# Patient Record
Sex: Female | Born: 1985 | Race: Black or African American | Hispanic: No | Marital: Single | Smoking: Former smoker
Health system: Southern US, Community
[De-identification: ages and names within clinical notes are randomized; demographics above are authoritative.]

## PROBLEM LIST (undated history)

## (undated) ENCOUNTER — Inpatient Hospital Stay (HOSPITAL_COMMUNITY): Payer: Self-pay

## (undated) DIAGNOSIS — R51 Headache: Secondary | ICD-10-CM

## (undated) DIAGNOSIS — D649 Anemia, unspecified: Secondary | ICD-10-CM

## (undated) DIAGNOSIS — R87613 High grade squamous intraepithelial lesion on cytologic smear of cervix (HGSIL): Secondary | ICD-10-CM

## (undated) DIAGNOSIS — O039 Complete or unspecified spontaneous abortion without complication: Secondary | ICD-10-CM

## (undated) DIAGNOSIS — R87619 Unspecified abnormal cytological findings in specimens from cervix uteri: Secondary | ICD-10-CM

## (undated) DIAGNOSIS — B9689 Other specified bacterial agents as the cause of diseases classified elsewhere: Secondary | ICD-10-CM

## (undated) DIAGNOSIS — R011 Cardiac murmur, unspecified: Secondary | ICD-10-CM

## (undated) DIAGNOSIS — N83209 Unspecified ovarian cyst, unspecified side: Secondary | ICD-10-CM

## (undated) DIAGNOSIS — IMO0002 Reserved for concepts with insufficient information to code with codable children: Secondary | ICD-10-CM

## (undated) DIAGNOSIS — N760 Acute vaginitis: Secondary | ICD-10-CM

## (undated) DIAGNOSIS — J189 Pneumonia, unspecified organism: Secondary | ICD-10-CM

## (undated) DIAGNOSIS — R87629 Unspecified abnormal cytological findings in specimens from vagina: Secondary | ICD-10-CM

## (undated) HISTORY — DX: High grade squamous intraepithelial lesion on cytologic smear of cervix (HGSIL): R87.613

## (undated) HISTORY — PX: MYRINGOTOMY: SHX2060

## (undated) HISTORY — DX: Complete or unspecified spontaneous abortion without complication: O03.9

## (undated) HISTORY — PX: THERAPEUTIC ABORTION: SHX798

## (undated) HISTORY — PX: COLPOSCOPY: SHX161

---
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
## 2004-10-03 ENCOUNTER — Inpatient Hospital Stay (HOSPITAL_COMMUNITY): Admission: AD | Admit: 2004-10-03 | Discharge: 2004-10-03 | Payer: Self-pay | Admitting: Obstetrics and Gynecology

## 2005-02-27 ENCOUNTER — Emergency Department (HOSPITAL_COMMUNITY): Admission: EM | Admit: 2005-02-27 | Discharge: 2005-02-27 | Payer: Self-pay | Admitting: Emergency Medicine

## 2005-03-25 ENCOUNTER — Emergency Department (HOSPITAL_COMMUNITY): Admission: EM | Admit: 2005-03-25 | Discharge: 2005-03-25 | Payer: Self-pay | Admitting: Emergency Medicine

## 2005-04-08 ENCOUNTER — Emergency Department (HOSPITAL_COMMUNITY): Admission: EM | Admit: 2005-04-08 | Discharge: 2005-04-08 | Payer: Self-pay | Admitting: Emergency Medicine

## 2005-12-23 ENCOUNTER — Emergency Department (HOSPITAL_COMMUNITY): Admission: EM | Admit: 2005-12-23 | Discharge: 2005-12-23 | Payer: Self-pay | Admitting: Emergency Medicine

## 2006-01-27 ENCOUNTER — Emergency Department (HOSPITAL_COMMUNITY): Admission: EM | Admit: 2006-01-27 | Discharge: 2006-01-27 | Payer: Self-pay | Admitting: Emergency Medicine

## 2006-02-06 ENCOUNTER — Emergency Department (HOSPITAL_COMMUNITY): Admission: EM | Admit: 2006-02-06 | Discharge: 2006-02-06 | Payer: Self-pay | Admitting: Emergency Medicine

## 2006-02-11 ENCOUNTER — Inpatient Hospital Stay (HOSPITAL_COMMUNITY): Admission: AD | Admit: 2006-02-11 | Discharge: 2006-02-11 | Payer: Self-pay | Admitting: Obstetrics and Gynecology

## 2006-02-15 ENCOUNTER — Emergency Department (HOSPITAL_COMMUNITY): Admission: EM | Admit: 2006-02-15 | Discharge: 2006-02-15 | Payer: Self-pay | Admitting: Emergency Medicine

## 2006-03-07 ENCOUNTER — Emergency Department (HOSPITAL_COMMUNITY): Admission: EM | Admit: 2006-03-07 | Discharge: 2006-03-07 | Payer: Self-pay | Admitting: Emergency Medicine

## 2006-03-11 ENCOUNTER — Inpatient Hospital Stay (HOSPITAL_COMMUNITY): Admission: AD | Admit: 2006-03-11 | Discharge: 2006-03-11 | Payer: Self-pay | Admitting: Gynecology

## 2006-03-13 ENCOUNTER — Other Ambulatory Visit: Admission: RE | Admit: 2006-03-13 | Discharge: 2006-03-13 | Payer: Self-pay | Admitting: Obstetrics and Gynecology

## 2006-05-12 ENCOUNTER — Inpatient Hospital Stay (HOSPITAL_COMMUNITY): Admission: AD | Admit: 2006-05-12 | Discharge: 2006-05-12 | Payer: Self-pay | Admitting: Obstetrics and Gynecology

## 2006-05-13 ENCOUNTER — Inpatient Hospital Stay (HOSPITAL_COMMUNITY): Admission: AD | Admit: 2006-05-13 | Discharge: 2006-05-13 | Payer: Self-pay | Admitting: Obstetrics and Gynecology

## 2006-05-20 ENCOUNTER — Inpatient Hospital Stay (HOSPITAL_COMMUNITY): Admission: AD | Admit: 2006-05-20 | Discharge: 2006-05-20 | Payer: Self-pay | Admitting: Obstetrics and Gynecology

## 2006-05-22 ENCOUNTER — Ambulatory Visit: Payer: Self-pay | Admitting: Cardiology

## 2006-05-26 ENCOUNTER — Ambulatory Visit: Payer: Self-pay | Admitting: Obstetrics & Gynecology

## 2006-06-09 ENCOUNTER — Ambulatory Visit: Payer: Self-pay | Admitting: Family Medicine

## 2006-07-03 ENCOUNTER — Ambulatory Visit: Payer: Self-pay | Admitting: Family Medicine

## 2006-07-03 ENCOUNTER — Ambulatory Visit: Payer: Self-pay | Admitting: Cardiology

## 2006-07-14 ENCOUNTER — Inpatient Hospital Stay (HOSPITAL_COMMUNITY): Admission: AD | Admit: 2006-07-14 | Discharge: 2006-07-14 | Payer: Self-pay | Admitting: Obstetrics & Gynecology

## 2006-07-14 ENCOUNTER — Ambulatory Visit: Payer: Self-pay | Admitting: *Deleted

## 2006-07-17 ENCOUNTER — Ambulatory Visit: Payer: Self-pay | Admitting: Family Medicine

## 2006-07-18 ENCOUNTER — Ambulatory Visit (HOSPITAL_COMMUNITY): Admission: RE | Admit: 2006-07-18 | Discharge: 2006-07-18 | Payer: Self-pay | Admitting: Family Medicine

## 2006-07-31 ENCOUNTER — Ambulatory Visit: Payer: Self-pay | Admitting: Obstetrics & Gynecology

## 2006-08-18 ENCOUNTER — Ambulatory Visit: Payer: Self-pay | Admitting: Family Medicine

## 2006-08-18 ENCOUNTER — Ambulatory Visit (HOSPITAL_COMMUNITY): Admission: RE | Admit: 2006-08-18 | Discharge: 2006-08-18 | Payer: Self-pay | Admitting: Family Medicine

## 2006-09-04 ENCOUNTER — Ambulatory Visit: Payer: Self-pay | Admitting: Cardiology

## 2006-09-08 ENCOUNTER — Ambulatory Visit: Payer: Self-pay | Admitting: Family Medicine

## 2006-09-15 ENCOUNTER — Encounter: Payer: Self-pay | Admitting: Cardiology

## 2006-09-15 ENCOUNTER — Ambulatory Visit: Payer: Self-pay | Admitting: Obstetrics & Gynecology

## 2006-09-15 ENCOUNTER — Ambulatory Visit: Payer: Self-pay

## 2006-09-22 ENCOUNTER — Ambulatory Visit: Payer: Self-pay | Admitting: Family Medicine

## 2006-09-24 ENCOUNTER — Ambulatory Visit: Payer: Self-pay | Admitting: Obstetrics and Gynecology

## 2006-09-24 ENCOUNTER — Inpatient Hospital Stay (HOSPITAL_COMMUNITY): Admission: AD | Admit: 2006-09-24 | Discharge: 2006-09-25 | Payer: Self-pay | Admitting: Obstetrics and Gynecology

## 2006-09-25 ENCOUNTER — Inpatient Hospital Stay (HOSPITAL_COMMUNITY): Admission: AD | Admit: 2006-09-25 | Discharge: 2006-09-28 | Payer: Self-pay | Admitting: Obstetrics and Gynecology

## 2006-09-25 ENCOUNTER — Ambulatory Visit: Payer: Self-pay | Admitting: Obstetrics and Gynecology

## 2006-10-04 ENCOUNTER — Inpatient Hospital Stay (HOSPITAL_COMMUNITY): Admission: AD | Admit: 2006-10-04 | Discharge: 2006-10-04 | Payer: Self-pay | Admitting: Obstetrics and Gynecology

## 2006-10-04 ENCOUNTER — Ambulatory Visit: Payer: Self-pay | Admitting: Obstetrics and Gynecology

## 2006-10-07 ENCOUNTER — Ambulatory Visit: Payer: Self-pay | Admitting: Obstetrics & Gynecology

## 2007-04-29 ENCOUNTER — Inpatient Hospital Stay (HOSPITAL_COMMUNITY): Admission: AD | Admit: 2007-04-29 | Discharge: 2007-04-29 | Payer: Self-pay | Admitting: Family Medicine

## 2007-06-22 ENCOUNTER — Inpatient Hospital Stay (HOSPITAL_COMMUNITY): Admission: AD | Admit: 2007-06-22 | Discharge: 2007-06-22 | Payer: Self-pay | Admitting: Gynecology

## 2007-09-22 ENCOUNTER — Inpatient Hospital Stay (HOSPITAL_COMMUNITY): Admission: AD | Admit: 2007-09-22 | Discharge: 2007-09-23 | Payer: Self-pay | Admitting: Obstetrics & Gynecology

## 2007-09-23 ENCOUNTER — Inpatient Hospital Stay (HOSPITAL_COMMUNITY): Admission: AD | Admit: 2007-09-23 | Discharge: 2007-09-24 | Payer: Self-pay | Admitting: Obstetrics and Gynecology

## 2007-12-16 ENCOUNTER — Emergency Department (HOSPITAL_COMMUNITY): Admission: EM | Admit: 2007-12-16 | Discharge: 2007-12-16 | Payer: Self-pay | Admitting: Emergency Medicine

## 2008-09-14 ENCOUNTER — Emergency Department (HOSPITAL_COMMUNITY): Admission: EM | Admit: 2008-09-14 | Discharge: 2008-09-14 | Payer: Self-pay | Admitting: Emergency Medicine

## 2009-09-14 ENCOUNTER — Emergency Department (HOSPITAL_COMMUNITY): Admission: EM | Admit: 2009-09-14 | Discharge: 2009-09-14 | Payer: Self-pay | Admitting: Emergency Medicine

## 2009-12-10 ENCOUNTER — Inpatient Hospital Stay (HOSPITAL_COMMUNITY): Admission: AD | Admit: 2009-12-10 | Discharge: 2009-12-10 | Payer: Self-pay | Admitting: Internal Medicine

## 2009-12-10 ENCOUNTER — Ambulatory Visit: Payer: Self-pay | Admitting: Obstetrics and Gynecology

## 2010-08-22 ENCOUNTER — Inpatient Hospital Stay (HOSPITAL_COMMUNITY)
Admission: AD | Admit: 2010-08-22 | Discharge: 2010-08-23 | Payer: Self-pay | Source: Home / Self Care | Attending: Obstetrics & Gynecology | Admitting: Obstetrics & Gynecology

## 2010-08-22 LAB — URINALYSIS, ROUTINE W REFLEX MICROSCOPIC
Bilirubin Urine: NEGATIVE
Leukocytes, UA: NEGATIVE
Nitrite: NEGATIVE
Specific Gravity, Urine: 1.025 (ref 1.005–1.030)
Urobilinogen, UA: 1 mg/dL (ref 0.0–1.0)
pH: 6.5 (ref 5.0–8.0)

## 2010-08-22 LAB — POCT PREGNANCY, URINE: Preg Test, Ur: NEGATIVE

## 2010-08-22 LAB — URINE MICROSCOPIC-ADD ON

## 2010-08-31 ENCOUNTER — Inpatient Hospital Stay (HOSPITAL_COMMUNITY)
Admission: AD | Admit: 2010-08-31 | Discharge: 2010-08-31 | Disposition: A | Discharge status: Home / Self Care | Payer: Self-pay | Source: Ambulatory Visit | Attending: Family Medicine | Admitting: Family Medicine

## 2010-08-31 DIAGNOSIS — L299 Pruritus, unspecified: Secondary | ICD-10-CM | POA: Insufficient documentation

## 2010-08-31 DIAGNOSIS — T373X5A Adverse effect of other antiprotozoal drugs, initial encounter: Secondary | ICD-10-CM | POA: Insufficient documentation

## 2010-09-18 ENCOUNTER — Inpatient Hospital Stay (HOSPITAL_COMMUNITY)
Admission: AD | Admit: 2010-09-18 | Discharge: 2010-09-18 | Disposition: A | Discharge status: Home / Self Care | Payer: Self-pay | Source: Ambulatory Visit | Attending: Obstetrics & Gynecology | Admitting: Obstetrics & Gynecology

## 2010-09-18 DIAGNOSIS — N898 Other specified noninflammatory disorders of vagina: Secondary | ICD-10-CM | POA: Insufficient documentation

## 2010-09-18 LAB — URINE MICROSCOPIC-ADD ON

## 2010-09-18 LAB — URINALYSIS, ROUTINE W REFLEX MICROSCOPIC
Ketones, ur: 15 mg/dL — AB
Urine Glucose, Fasting: NEGATIVE mg/dL
pH: 5.5 (ref 5.0–8.0)

## 2010-09-18 LAB — WET PREP, GENITAL
Clue Cells Wet Prep HPF POC: NONE SEEN
Yeast Wet Prep HPF POC: NONE SEEN

## 2010-09-19 LAB — URINE CULTURE
Colony Count: NO GROWTH
Culture  Setup Time: 201202210442
Culture: NO GROWTH

## 2010-09-28 ENCOUNTER — Inpatient Hospital Stay (HOSPITAL_COMMUNITY)
Admission: AD | Admit: 2010-09-28 | Discharge: 2010-09-28 | Discharge status: Against Medical Advice | Payer: Self-pay | Source: Ambulatory Visit | Attending: Obstetrics & Gynecology | Admitting: Obstetrics & Gynecology

## 2010-09-28 DIAGNOSIS — N760 Acute vaginitis: Secondary | ICD-10-CM | POA: Insufficient documentation

## 2010-09-28 DIAGNOSIS — A499 Bacterial infection, unspecified: Secondary | ICD-10-CM | POA: Insufficient documentation

## 2010-09-28 DIAGNOSIS — B9689 Other specified bacterial agents as the cause of diseases classified elsewhere: Secondary | ICD-10-CM | POA: Insufficient documentation

## 2010-09-28 LAB — POCT PREGNANCY, URINE: Preg Test, Ur: NEGATIVE

## 2010-09-29 ENCOUNTER — Inpatient Hospital Stay (HOSPITAL_COMMUNITY)
Admission: AD | Admit: 2010-09-29 | Discharge: 2010-09-29 | Disposition: A | Discharge status: Home / Self Care | Payer: Self-pay | Source: Ambulatory Visit | Attending: Obstetrics & Gynecology | Admitting: Obstetrics & Gynecology

## 2010-09-29 DIAGNOSIS — N760 Acute vaginitis: Secondary | ICD-10-CM

## 2010-09-29 DIAGNOSIS — N949 Unspecified condition associated with female genital organs and menstrual cycle: Secondary | ICD-10-CM | POA: Insufficient documentation

## 2010-09-29 DIAGNOSIS — A499 Bacterial infection, unspecified: Secondary | ICD-10-CM | POA: Insufficient documentation

## 2010-09-29 DIAGNOSIS — B9689 Other specified bacterial agents as the cause of diseases classified elsewhere: Secondary | ICD-10-CM | POA: Insufficient documentation

## 2010-09-29 LAB — WET PREP, GENITAL: Yeast Wet Prep HPF POC: NONE SEEN

## 2010-10-15 LAB — URINE MICROSCOPIC-ADD ON

## 2010-10-15 LAB — URINALYSIS, ROUTINE W REFLEX MICROSCOPIC
Bilirubin Urine: NEGATIVE
Glucose, UA: NEGATIVE mg/dL
Ketones, ur: NEGATIVE mg/dL
Protein, ur: NEGATIVE mg/dL
Urobilinogen, UA: 0.2 mg/dL (ref 0.0–1.0)

## 2010-10-15 LAB — POCT PREGNANCY, URINE: Preg Test, Ur: NEGATIVE

## 2010-12-06 ENCOUNTER — Inpatient Hospital Stay (HOSPITAL_COMMUNITY)
Admission: AD | Admit: 2010-12-06 | Discharge: 2010-12-07 | Disposition: A | Discharge status: Home / Self Care | Payer: Medicaid Other | Source: Ambulatory Visit | Attending: Obstetrics and Gynecology | Admitting: Obstetrics and Gynecology

## 2010-12-06 DIAGNOSIS — N949 Unspecified condition associated with female genital organs and menstrual cycle: Secondary | ICD-10-CM

## 2010-12-06 DIAGNOSIS — N760 Acute vaginitis: Secondary | ICD-10-CM | POA: Insufficient documentation

## 2010-12-06 DIAGNOSIS — A499 Bacterial infection, unspecified: Secondary | ICD-10-CM

## 2010-12-06 DIAGNOSIS — B9689 Other specified bacterial agents as the cause of diseases classified elsewhere: Secondary | ICD-10-CM | POA: Insufficient documentation

## 2010-12-06 LAB — WET PREP, GENITAL
Trich, Wet Prep: NONE SEEN
Yeast Wet Prep HPF POC: NONE SEEN

## 2010-12-07 LAB — GC/CHLAMYDIA PROBE AMP, GENITAL: GC Probe Amp, Genital: NEGATIVE

## 2010-12-14 NOTE — Assessment & Plan Note (Signed)
[facility]                            CARDIOLOGY OFFICE NOTE   Latoya, Palmer                    MRN:          [id number]  DATE:09/04/2006                            DOB:          [birth year]    REASON FOR VISIT:  Follow up cardiac testing.   HISTORY OF PRESENT ILLNESS:  I saw Latoya Palmer back in October.  She  was referred at that time with premature ventricular complexes noted in  the setting of concurrent abdominal cramps.  I referred her for an  echocardiogram, although she did not present for this study.  She wore  an event recorder which documented premature ventricular complexes but  no sustained arrhythmias.  Her resting electrocardiogram today is  normal, showing sinus rhythm at 80 beats per minute.  She has had no  chest pain or clear exertional symptoms.  She has had no prolonged  dizziness or syncope.  I spoke with her today about the premature  ventricular complexes and would still recommend a structural cardiac  assessment to exclude any element of cardiomyopathy and also to further  evaluate her murmur which at this point I suspect is functional.  At  this point we do not plan any specific medications.   ALLERGIES:  NO KNOWN DRUG ALLERGIES.   PRESENT MEDICATIONS:  Prenatal vitamins.   REVIEW OF SYSTEMS:  As described in history of present illness.   EXAMINATION:  VITAL SIGNS:  Blood pressure today 92/52, heart rate 80,  weight 142 up from 128 in October.  GENERAL:  The patient states that she is due on March 3rd.  The patient  is comfortable and in no acute distress.  HEENT:  Conjunctivae, lids normal.  Oropharynx clear.  Neck is supple  without elevated jugular venous pressure.  LUNGS:  Clear without labored breathing.  CARDIAC:  Exam reveals a regular rate and rhythm with a 2/6 systolic  murmur heard best at the base.  Second heart sound is preserved.  No S3  gallop.  ABDOMEN:  Gravid.  EXTREMITIES:  No significant  pitting edema.  SKIN:  Warm and dry.  MUSCULOSKELETAL:  No kyphosis is noted.   IMPRESSION AND RECOMMENDATION:  1. Premature ventricular complexes, now documented by      electrocardiography and an event recorder.  This is not associated      with any dizziness or syncope.  She typically experiences the sense      of palpitations and brief anxiety.  She has noted feeling this when      she has had some contractions.  Otherwise, no orthopnea or      progressive dyspnea on exertion.  I have again recommended an      echocardiogram for her cardiac structural assessment mainly to      exclude cardiomyopathy.  If this is reassuring would recommend      continued observation and no specific medications directed at this      at the present time.  Following her delivery, I      can see her back in the office.  2. Further plans to follow.  Jonelle Sidle, MD  Electronically Signed    SGM/MedQ  DD: 09/04/2006  DT: 09/04/2006  Job #: [id number]   cc:   Ginette Otto, [place] Dr. Yetta Barre, [facility]  Carollee Herter, [place]

## 2010-12-14 NOTE — Assessment & Plan Note (Signed)
[facility]                              CARDIOLOGY OFFICE NOTE   JERMISHA, HOFFART                    MRN:          [id number]  DATE:05/22/2006                            DOB:          03/16/1986    REFERRING PHYSICIAN:  Carollee Herter, DO   REASON FOR CONSULTATION:  Abnormal electrocardiogram.   HISTORY OF PRESENT ILLNESS:  Ms. Cullen is a 25 year old woman, presently  [redacted] weeks pregnant, with no other major reported medical conditions other  than migraine headaches in the past.  She apparently recently was seen at  [facility] with some abdominal cramping intermittent over a period of a  week.  She was seen at that time and noted to have an irregular heartbeat  and underwent an electrocardiogram.  This tracing from October 23 shows  sinus rhythm with frequent premature ventricular complexes (inferior axis,  likely right-sided in origin).  She denies feeling any major sense of  palpitations at that time, although does state that intermittently, she  feels a brief, rapid heart rate for no more than a few minutes.  She has had  no significant dizziness or syncope associated with this and denies any  exertional symptoms either prior to or during her pregnancy.  She states  that she was pregnant once in the past, although did not go to term and had  an abortion early on.  She presents today to discuss this further.  A repeat  electrocardiogram today is normal, showing sinus rhythm at 81 beats per  minute with no ectopy.  She was also noted to have a soft systolic murmur on  examination during her evaluation at [facility].   ALLERGIES:  No known drug allergies.   CURRENT MEDICATIONS:  Prenatal vitamins.   SOCIAL HISTORY:  Patient is single.  She has a boyfriend.  She has no  children.  She has a prior tobacco use history but states she quit back in  July.  She denies any recreational drug or alcohol use.  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   FAMILY HISTORY:  Reported as negative for premature cardiovascular disease  or sudden cardiac death.   REVIEW OF SYSTEMS:  As described in the history of present illness.  She has  had prior problems with anemia, otherwise systems are negative.   PHYSICAL EXAMINATION:  VITAL SIGNS:  Blood pressure is 98/62, heart rate 81  and regular.  Weight is 128 pounds.  GENERAL:  Patient is comfortable and in no acute distress, denying any  active symptoms.  HEENT:  Conjunctivae and lids normal.  Oropharynx is clear.  NECK:  Supple without elevated jugular venous pressure or loud bruits.  No  thyromegaly is noted.  No thyroid tenderness is noted.  LUNGS:  Clear without labored breathing.  CARDIOVASCULAR:  Regular rate and rhythm with soft basal systolic murmur,  preserved S2.  No S3 gallop or pericardial rub.  ABDOMEN:  Gravid.  Bowel sounds present.  EXTREMITIES:  No significant pitting edema.  Distal pulses are 2+.  SKIN:  No ulcerative changes noted.  MUSCULOSKELETAL:  No kyphosis.  NEUROPSYCHIATRIC:  Patient is alert and oriented x3.  Affect is normal.   IMPRESSION/RECOMMENDATIONS:  1. Recently documented frequent premature ventricular complexes noted in      the setting of concurrent abdominal cramps in a young woman presently      [redacted] weeks pregnant.  Repeat electrocardiogram today is normal.      Symptomatically, Ms. Hove denies any major problems other than      occasional brief palpitations not associated with dizziness or syncope.      She has had no antecedant exertional symptomatology.  She does have a      soft systolic murmur on exam, which is likely functional in the setting      of pregnancy.  We discussed a number of options today, and our plan      will be a baseline 2D echocardiogram to confirm a structurally normal      heart, and also an event recorder to exclude any transient sustained      arrhythmias.  I do not plan any  specific medications at this time and      will plan to see her back to review the results of her testing.  2. Further plans to follow.     Jonelle Sidle, MD    SGM/MedQ  DD: 05/22/2006  DT: 05/23/2006  Job #: [id number]   cc:   Carollee Herter, DO

## 2010-12-27 ENCOUNTER — Emergency Department (HOSPITAL_COMMUNITY)
Admission: EM | Admit: 2010-12-27 | Discharge: 2010-12-27 | Discharge status: Against Medical Advice | Payer: Medicaid Other | Attending: Emergency Medicine | Admitting: Emergency Medicine

## 2010-12-27 ENCOUNTER — Inpatient Hospital Stay (HOSPITAL_COMMUNITY)
Admission: AD | Admit: 2010-12-27 | Payer: Medicaid Other | Source: Ambulatory Visit | Admitting: Obstetrics and Gynecology

## 2010-12-27 DIAGNOSIS — R109 Unspecified abdominal pain: Secondary | ICD-10-CM | POA: Insufficient documentation

## 2010-12-27 LAB — URINALYSIS, ROUTINE W REFLEX MICROSCOPIC
Glucose, UA: NEGATIVE mg/dL
Ketones, ur: NEGATIVE mg/dL
Protein, ur: NEGATIVE mg/dL
pH: 7 (ref 5.0–8.0)

## 2010-12-27 LAB — URINE MICROSCOPIC-ADD ON

## 2011-03-20 ENCOUNTER — Encounter (HOSPITAL_COMMUNITY): Payer: Self-pay | Admitting: *Deleted

## 2011-03-20 ENCOUNTER — Inpatient Hospital Stay (HOSPITAL_COMMUNITY)
Admission: AD | Admit: 2011-03-20 | Discharge: 2011-03-20 | Disposition: A | Discharge status: Home / Self Care | Payer: Self-pay | Source: Ambulatory Visit | Attending: Obstetrics & Gynecology | Admitting: Obstetrics & Gynecology

## 2011-03-20 DIAGNOSIS — A499 Bacterial infection, unspecified: Secondary | ICD-10-CM | POA: Insufficient documentation

## 2011-03-20 DIAGNOSIS — N760 Acute vaginitis: Secondary | ICD-10-CM | POA: Insufficient documentation

## 2011-03-20 DIAGNOSIS — B9689 Other specified bacterial agents as the cause of diseases classified elsewhere: Secondary | ICD-10-CM | POA: Insufficient documentation

## 2011-03-20 DIAGNOSIS — N949 Unspecified condition associated with female genital organs and menstrual cycle: Secondary | ICD-10-CM | POA: Insufficient documentation

## 2011-03-20 HISTORY — DX: Unspecified abnormal cytological findings in specimens from cervix uteri: R87.619

## 2011-03-20 HISTORY — DX: Cardiac murmur, unspecified: R01.1

## 2011-03-20 HISTORY — DX: Headache: R51

## 2011-03-20 HISTORY — DX: Reserved for concepts with insufficient information to code with codable children: IMO0002

## 2011-03-20 HISTORY — DX: Anemia, unspecified: D64.9

## 2011-03-20 HISTORY — DX: Unspecified ovarian cyst, unspecified side: N83.209

## 2011-03-20 LAB — WET PREP, GENITAL
Trich, Wet Prep: NONE SEEN
Yeast Wet Prep HPF POC: NONE SEEN

## 2011-03-20 LAB — POCT PREGNANCY, URINE: Preg Test, Ur: NEGATIVE

## 2011-03-20 MED ORDER — CLINDAMYCIN HCL 150 MG PO CAPS: 300.0000 mg | ORAL_CAPSULE | Freq: Two times a day (BID) | ORAL | Status: AC

## 2011-03-20 MED ORDER — CLINDAMYCIN HCL 150 MG PO CAPS: 300.0000 mg | ORAL_CAPSULE | Freq: Two times a day (BID) | ORAL | Status: DC

## 2011-03-20 NOTE — Progress Notes (Signed)
Pt states she has a history of recurrent bacterial infections. Has had a vaginal d/c with an odor for 3 days.

## 2011-03-20 NOTE — ED Provider Notes (Signed)
History     Chief Complaint  Patient presents with  . Vaginal Discharge   HPI Latoya Palmer 25 y.o. comes today with white discharge with odor for several day.  Thinks she has BV.  Has hives when she takes Flagyl by mouth, but uses Metrogel with no problem.  Currently does not have insurance.  Stopped Nuvaring earlier this month.     OB History    Grav Para Term Preterm Abortions TAB SAB Ect Mult Living   6 1 1  0 5 3 2  0 0 1      Past Medical History  Diagnosis Date  . Headache   . Heart murmur   . Anemia   . Ovarian cyst   . Abnormal Pap smear   . Preterm labor     Past Surgical History  Procedure Date  . Myringotomy   . Therapeutic abortion     two  . Colposcopy     No family history on file.  History  Substance Use Topics  . Smoking status: Current Everyday Smoker -- 0.2 packs/day for 4 years  . Smokeless tobacco: Never Used  . Alcohol Use: Yes     twice a month    Allergies:  Allergies  Allergen Reactions  . Flagyl (Metronidazole Hcl) Other (See Comments)    Pt states that 24 hours after taking (she is able to take it fine) she develops hives  . Latex Swelling and Other (See Comments)    Causes irritation    Prescriptions prior to admission  Medication Sig Dispense Refill  . Multiple Vitamin (MULTIVITAMIN) tablet Take 1 tablet by mouth daily.          Review of Systems  Gastrointestinal: Negative for abdominal pain.  Genitourinary: Negative for dysuria.       Vaginal discharge.  Some vaginal bleeding today.   Physical Exam   Blood pressure 121/87, pulse 66, temperature 98.8 F (37.1 C), temperature source Oral, resp. rate 16, height 5' 5.5" (1.664 m), weight 125 lb 6.4 oz (56.881 kg), last menstrual period 03/04/2011, SpO2 100.00%.  Physical Exam  Nursing note and vitals reviewed. Constitutional: She is oriented to person, place, and time. She appears well-developed and well-nourished.  HENT:  Head: Normocephalic.  Eyes: EOM are  normal.  Neck: Neck supple.  GI: Soft. There is no tenderness. There is no rebound and no guarding.  Genitourinary:       Speculum exam: Vagina - Small amount of pink frothy discharge, no odor Cervix - No contact bleeding, no active bleeding noted. Bimanual exam: Cervix closed Uterus non tender, normal size Adnexa non tender, no masses bilaterally GC/Chlam, wet prep done Chaperone present for exam.    Musculoskeletal: Normal range of motion.  Neurological: She is alert and oriented to person, place, and time.  Skin: Skin is warm and dry.  Psychiatric: She has a normal mood and affect.    MAU Course  Procedures  MDM Results for orders placed during the hospital encounter of 03/20/11 (from the past 24 hour(s))  WET PREP, GENITAL     Status: Abnormal   Collection Time   03/20/11 12:25 PM      Component Value Range   Yeast, Wet Prep NONE SEEN  NONE SEEN    Trich, Wet Prep NONE SEEN  NONE SEEN    Clue Cells, Wet Prep MODERATE (*) NONE SEEN    WBC, Wet Prep HPF POC FEW (*) NONE SEEN   POCT PREGNANCY, URINE  Status: Normal   Collection Time   03/20/11 12:46 PM      Component Value Range   Preg Test, Ur NEGATIVE       Assessment and Plan  Bacterial vaginosis  Plan Rx Clindamycin 300 mg PO BID x 7 days (#14) no refills Condoms for intercourse Establish with a local provider for medical care.   Yee Gangi 03/20/2011, 12:30 PM   Nolene Bernheim, NP 03/20/11 1824

## 2011-03-20 NOTE — Progress Notes (Signed)
Symptoms started a wk ago (d/c with odor) has had bv before.

## 2011-03-21 LAB — GC/CHLAMYDIA PROBE AMP, GENITAL: GC Probe Amp, Genital: NEGATIVE

## 2011-04-19 LAB — URINALYSIS, ROUTINE W REFLEX MICROSCOPIC
Bilirubin Urine: NEGATIVE
Bilirubin Urine: NEGATIVE
Glucose, UA: NEGATIVE
Glucose, UA: NEGATIVE
Hgb urine dipstick: NEGATIVE
Ketones, ur: NEGATIVE
Nitrite: NEGATIVE
Protein, ur: NEGATIVE
Specific Gravity, Urine: 1.025
pH: 6.5

## 2011-04-19 LAB — GC/CHLAMYDIA PROBE AMP, GENITAL
Chlamydia, DNA Probe: NEGATIVE
GC Probe Amp, Genital: NEGATIVE

## 2011-04-19 LAB — CBC
Hemoglobin: 12.5
MCHC: 34.3
MCV: 86.5
RBC: 4.2
RDW: 13.6

## 2011-04-19 LAB — POCT PREGNANCY, URINE: Preg Test, Ur: NEGATIVE

## 2011-05-07 LAB — WET PREP, GENITAL
Trich, Wet Prep: NONE SEEN
Yeast Wet Prep HPF POC: NONE SEEN

## 2011-05-07 LAB — URINALYSIS, ROUTINE W REFLEX MICROSCOPIC
Bilirubin Urine: NEGATIVE
Glucose, UA: NEGATIVE
Ketones, ur: NEGATIVE
Leukocytes, UA: NEGATIVE
pH: 7

## 2011-05-07 LAB — CBC
Hemoglobin: 12.7
RBC: 4.33
WBC: 5.9

## 2011-05-07 LAB — POCT PREGNANCY, URINE
Operator id: 20265
Preg Test, Ur: NEGATIVE

## 2011-05-07 LAB — GC/CHLAMYDIA PROBE AMP, GENITAL: GC Probe Amp, Genital: NEGATIVE

## 2011-05-09 LAB — CBC
HCT: 38.7
Hemoglobin: 13.2
MCHC: 34.3
MCV: 85.6
RDW: 12.9

## 2011-05-09 LAB — HERPES SIMPLEX VIRUS CULTURE

## 2011-05-09 LAB — URINALYSIS, ROUTINE W REFLEX MICROSCOPIC
Bilirubin Urine: NEGATIVE
Glucose, UA: NEGATIVE
Hgb urine dipstick: NEGATIVE
Ketones, ur: NEGATIVE
pH: 7.5

## 2011-05-09 LAB — WET PREP, GENITAL: Yeast Wet Prep HPF POC: NONE SEEN

## 2011-06-10 ENCOUNTER — Inpatient Hospital Stay (HOSPITAL_COMMUNITY)
Admission: AD | Admit: 2011-06-10 | Discharge: 2011-06-10 | Disposition: A | Discharge status: Home / Self Care | Payer: Self-pay | Source: Ambulatory Visit | Attending: Obstetrics & Gynecology | Admitting: Obstetrics & Gynecology

## 2011-06-10 DIAGNOSIS — N898 Other specified noninflammatory disorders of vagina: Secondary | ICD-10-CM | POA: Insufficient documentation

## 2011-06-10 NOTE — Progress Notes (Signed)
Pt reports "I have bacterial vaginosis", states she has a discharge with an odor x 3 days. LMP 05/13/2011.

## 2011-06-10 NOTE — Progress Notes (Signed)
Called pt wanting to leave AMA. Pt not in lobby to sign paperwork.

## 2011-06-29 ENCOUNTER — Inpatient Hospital Stay (HOSPITAL_COMMUNITY)
Admission: AD | Admit: 2011-06-29 | Discharge: 2011-06-30 | Disposition: A | Discharge status: Home / Self Care | Payer: Medicaid Other | Source: Ambulatory Visit | Attending: Obstetrics and Gynecology | Admitting: Obstetrics and Gynecology

## 2011-06-29 DIAGNOSIS — A499 Bacterial infection, unspecified: Secondary | ICD-10-CM | POA: Insufficient documentation

## 2011-06-29 DIAGNOSIS — N760 Acute vaginitis: Secondary | ICD-10-CM

## 2011-06-29 DIAGNOSIS — B9689 Other specified bacterial agents as the cause of diseases classified elsewhere: Secondary | ICD-10-CM

## 2011-06-29 DIAGNOSIS — N949 Unspecified condition associated with female genital organs and menstrual cycle: Secondary | ICD-10-CM | POA: Insufficient documentation

## 2011-06-29 NOTE — Progress Notes (Signed)
Pt states, " I've had an odorous vaginal discharge for two weeks, and now my vagina is tender and irritated. "

## 2011-06-30 ENCOUNTER — Encounter (HOSPITAL_COMMUNITY): Payer: Self-pay

## 2011-06-30 LAB — WET PREP, GENITAL

## 2011-06-30 MED ORDER — METRONIDAZOLE 0.75 % VA GEL: 1.0000 | Freq: Every day | VAGINAL | Status: AC

## 2011-06-30 NOTE — Progress Notes (Signed)
Patient is here with c/o vaginal tenderness and feeling that she has BV. She states that she gets it often and does not have a primary doctor.

## 2011-06-30 NOTE — ED Provider Notes (Signed)
History   Latoya Palmer is a 25 y.o. year old [id number] female who presents to [facility] reporting possible Bacterial vaginosis. States she gets them many times per year.   CSN: [id number] Arrival date & time: 06/29/2011 11:45 PM   None     Chief Complaint  Patient presents with  . Vaginal Discharge    (Consider location/radiation/quality/duration/timing/severity/associated sxs/prior treatment) HPI  Past Medical History  Diagnosis Date  . Headache   . Heart murmur   . Anemia   . Ovarian cyst   . Abnormal Pap smear   . Preterm labor     Past Surgical History  Procedure Date  . Myringotomy   . Therapeutic abortion     two  . Colposcopy     History reviewed. No pertinent family history.  History  Substance Use Topics  . Smoking status: Current Everyday Smoker -- 0.5 packs/day for 4 years    Types: Cigarettes  . Smokeless tobacco: Never Used  . Alcohol Use: 1.2 oz/week    1 Shots of liquor, 1 Glasses of wine per week     twice a month    OB History    Grav Para Term Preterm Abortions TAB SAB Ect Mult Living   6 1 1  0 5 3 2  0 0 1      Review of Systems: denies abd pain  Allergies  Flagyl and Latex  Home Medications  No current outpatient prescriptions on file.  BP 124/84  Pulse 65  Temp(Src) 98.8 F (37.1 C) (Oral)  Resp 16  Ht 5' 4.5" (1.638 m)  Wt 57.664 kg (127 lb 2 oz)  BMI 21.48 kg/m2  LMP 06/14/2011  Physical Exam:  General: NAD, A&O x 4 Abdomen: NT Pelvic. Large amount of thin, white, malodorous discharge. No lesions. Cervix non-friable. No CMT, adnexal tenderness or masses.  ED Course  Procedures (including critical care time)  Results for orders placed during the hospital encounter of 06/29/11 (from the past 24 hour(s))  WET PREP, GENITAL     Status: Abnormal   Collection Time   06/30/11 12:56 AM      Component Value Range   Yeast, Wet Prep NONE SEEN  NONE SEEN    Trich, Wet Prep NONE SEEN  NONE SEEN    Clue Cells, Wet Prep MODERATE  (*) NONE SEEN    WBC, Wet Prep HPF POC MODERATE (*) NONE SEEN     MDM  Assessment: 1. Recurrent BV  Plan: 1. Rx Metrogel QD x 5 days, then twice a week x 6 months.  Dorathy Kinsman 06/30/2011 1:34 AM

## 2011-07-01 LAB — POCT PREGNANCY, URINE: Preg Test, Ur: NEGATIVE

## 2011-07-05 NOTE — ED Provider Notes (Signed)
Attestation of Attending Supervision of Advanced Practitioner: Evaluation and management procedures were performed by the PA/NP/CNM/OB Fellow under my supervision/collaboration. Chart reviewed and agree with management and plan.  Erbie Arment V 07/05/2011 9:29 AM

## 2011-07-18 ENCOUNTER — Encounter (HOSPITAL_COMMUNITY): Payer: Self-pay | Admitting: *Deleted

## 2011-07-18 ENCOUNTER — Inpatient Hospital Stay (HOSPITAL_COMMUNITY)
Admission: AD | Admit: 2011-07-18 | Discharge: 2011-07-18 | Disposition: A | Discharge status: Home / Self Care | Payer: Medicaid Other | Source: Ambulatory Visit | Attending: Obstetrics and Gynecology | Admitting: Obstetrics and Gynecology

## 2011-07-18 DIAGNOSIS — A499 Bacterial infection, unspecified: Secondary | ICD-10-CM

## 2011-07-18 DIAGNOSIS — N760 Acute vaginitis: Secondary | ICD-10-CM | POA: Insufficient documentation

## 2011-07-18 DIAGNOSIS — B9689 Other specified bacterial agents as the cause of diseases classified elsewhere: Secondary | ICD-10-CM | POA: Insufficient documentation

## 2011-07-18 DIAGNOSIS — N949 Unspecified condition associated with female genital organs and menstrual cycle: Secondary | ICD-10-CM | POA: Insufficient documentation

## 2011-07-18 LAB — WET PREP, GENITAL: Trich, Wet Prep: NONE SEEN

## 2011-07-18 MED ORDER — CLINDAMYCIN HCL 150 MG PO CAPS: 150.0000 mg | ORAL_CAPSULE | Freq: Three times a day (TID) | ORAL | Status: DC

## 2011-07-18 NOTE — Progress Notes (Signed)
Pt presents to [facility] for c/o BV.  States was seen on 1st and given a prescription to take regularly to keep it from coming back.  States it never went away.

## 2011-07-18 NOTE — ED Provider Notes (Signed)
History     CSN: [id number] Arrival date & time: 07/18/2011  3:08 AM   None     Chief Complaint  Patient presents with  . Vaginal Discharge    HPI Latoya Palmer is a 25 y.o. female who presents to [facility] for vaginal discharge which she believes to be bacterial vaginosis. She has been to [facility]U for the same several times. The last visit 06/29/11 she was given flagyl to take x 5 days and then to take twice a week for 6 months. The patient states that the symptoms never went away and now are worse. She states she was diagnosed with allergy to Flagyl on a previous visit but got it on the last visit in a gel form. She states that in the past Clindamycin has worked better. Not sexually active in 6 months. [facility]  The history was provided by the patient and review of her previous visits.   Past Medical History  Diagnosis Date  . Headache   . Heart murmur   . Anemia   . Ovarian cyst   . Abnormal Pap smear   . Preterm labor     Past Surgical History  Procedure Date  . Myringotomy   . Therapeutic abortion     two  . Colposcopy     No family history on file.  History  Substance Use Topics  . Smoking status: Current Everyday Smoker -- 0.5 packs/day for 4 years    Types: Cigarettes  . Smokeless tobacco: Never Used  . Alcohol Use: 1.2 oz/week    1 Shots of liquor, 1 Glasses of wine per week     twice a month    OB History    Grav Para Term Preterm Abortions TAB SAB Ect Mult Living   6 1 1  0 5 3 2  0 0 1      Review of Systems  Genitourinary: Positive for vaginal discharge.  All other systems reviewed and are negative.    Allergies  Flagyl and Latex  Home Medications  No current outpatient prescriptions on file.  BP 125/65  Pulse 70  Resp 18  Ht 5\' 5"  (1.651 m)  Wt 125 lb (56.7 kg)  BMI 20.80 kg/m2  SpO2 99%  LMP 07/14/2011  Physical Exam  Nursing note and vitals reviewed. Constitutional: She is oriented to  person, place, and time. She appears well-developed and well-nourished.  HENT:  Head: Normocephalic.  Eyes: EOM are normal.  Neck: Neck supple.  Cardiovascular: Normal rate.   Pulmonary/Chest: Effort normal.  Abdominal: Soft. There is no tenderness.  Genitourinary:       External genitalia without lesions.Frothy vaginal discharge with odor. No CMT, no adnexal tenderness. Uterus without palpable enlargement.   Musculoskeletal: Normal range of motion.  Neurological: She is alert and oriented to person, place, and time. No cranial nerve deficit.  Skin: Skin is warm and dry.  Psychiatric: She has a normal mood and affect. Her behavior is normal. Judgment and thought content normal.   Results for orders placed during the hospital encounter of 07/18/11 (from the past 24 hour(s))  WET PREP, GENITAL     Status: Abnormal   Collection Time   07/18/11  3:37 AM      Component Value Range   Yeast, Wet Prep NONE SEEN  NONE SEEN    Trich, Wet Prep NONE SEEN  NONE SEEN    Clue Cells, Wet Prep MANY (*) NONE SEEN  WBC, Wet Prep HPF POC MODERATE (*) NONE SEEN    Assessment: Bacterial vaginosis  Plan:  Clindamycin Rx   Discussed with patient in detail need for continuity of care and suggested she choose a provider and have regular   visits to assess her for her recurrent BV and abnormal pap smears.  ED Course  Procedures           [place], [place] 07/18/11 [phone]

## 2011-07-18 NOTE — ED Provider Notes (Signed)
Attestation of Attending Supervision of Advanced Practitioner: Evaluation and management procedures were performed by the PA/NP/CNM/OB Fellow under my supervision/collaboration. Chart reviewed and agree with management and plan.  Belicia Difatta V 07/18/2011 7:47 AM

## 2011-07-19 LAB — GC/CHLAMYDIA PROBE AMP, GENITAL: GC Probe Amp, Genital: NEGATIVE

## 2011-07-21 ENCOUNTER — Other Ambulatory Visit: Payer: Self-pay | Admitting: Advanced Practice Midwife

## 2011-07-21 DIAGNOSIS — B9689 Other specified bacterial agents as the cause of diseases classified elsewhere: Secondary | ICD-10-CM

## 2011-07-21 MED ORDER — CLINDAMYCIN HCL 150 MG PO CAPS: 150.0000 mg | ORAL_CAPSULE | Freq: Three times a day (TID) | ORAL | Status: DC

## 2011-07-26 ENCOUNTER — Telehealth (HOSPITAL_COMMUNITY): Payer: Self-pay | Admitting: Gynecology

## 2011-07-26 MED ORDER — CLINDAMYCIN HCL 150 MG PO CAPS: 150.0000 mg | ORAL_CAPSULE | Freq: Three times a day (TID) | ORAL | Status: AC

## 2011-07-26 NOTE — Progress Notes (Signed)
Addended by: Dorathy Kinsman on: 07/26/2011 08:33 PM   Modules accepted: Orders

## 2011-08-28 ENCOUNTER — Ambulatory Visit (INDEPENDENT_AMBULATORY_CARE_PROVIDER_SITE_OTHER): Payer: Medicaid Other | Admitting: Obstetrics and Gynecology

## 2011-08-28 ENCOUNTER — Other Ambulatory Visit (HOSPITAL_COMMUNITY)
Admission: RE | Admit: 2011-08-28 | Discharge: 2011-08-28 | Disposition: A | Discharge status: Home / Self Care | Payer: Medicaid Other | Source: Ambulatory Visit | Attending: Obstetrics and Gynecology | Admitting: Obstetrics and Gynecology

## 2011-08-28 ENCOUNTER — Encounter: Payer: Self-pay | Admitting: Advanced Practice Midwife

## 2011-08-28 DIAGNOSIS — R87611 Atypical squamous cells cannot exclude high grade squamous intraepithelial lesion on cytologic smear of cervix (ASC-H): Secondary | ICD-10-CM | POA: Insufficient documentation

## 2011-08-28 DIAGNOSIS — Z01419 Encounter for gynecological examination (general) (routine) without abnormal findings: Secondary | ICD-10-CM | POA: Insufficient documentation

## 2011-08-28 DIAGNOSIS — A499 Bacterial infection, unspecified: Secondary | ICD-10-CM

## 2011-08-28 DIAGNOSIS — B9689 Other specified bacterial agents as the cause of diseases classified elsewhere: Secondary | ICD-10-CM

## 2011-08-28 DIAGNOSIS — N760 Acute vaginitis: Secondary | ICD-10-CM

## 2011-08-28 DIAGNOSIS — R87619 Unspecified abnormal cytological findings in specimens from cervix uteri: Secondary | ICD-10-CM

## 2011-08-28 NOTE — Patient Instructions (Signed)
General Instructions for Vaginal Infections Vaginitis is a term to describe many common vaginal infections. These infections may be due to an imbalance of normal germs (bacteria) that exist in the vagina. Many others are caused by sexually transmitted diseases (STD's). If any medication was prescribed to treat your specific infection, it is very important that you take the medication as directed. Your caregiver may want to examine and treat your sex partner. CAUSES  The vagina normally contains organisms (bacteria and yeast) in a balance. Certain factors can disturb this balance and cause an infection, such as:  Sexual intercourse.   Nursing.   Pregnancy.   Menopause.   Hormone changes in the body.   Antibiotics.   Infection elsewhere in your body.   Birth control pills or patches.   Douches.   Spermicides.   Medical illnesses (diabetes).  SYMPTOMS  Different types of vaginal infections cause symptoms such as:  Itching.   Pain or burning.   Bad odor.   Pain or bleeding with sexual intercourse.   Redness of the vulva.   Abnormal discharge (yellow, green, heavy white and thick).   Fever.   A sore on the vulva or vagina.   Urinary symptoms (painful or bloody urine).   Pelvic and/or abdominal pain.   Rectal bleeding, discharge or pain.  DIAGNOSIS   Your caregiver will base the diagnosis upon the symptoms that you report.   A complete history of your sex life may be taken   You may have a pelvic exam.   A sample of your vaginal fluid and/or discharge will be examined under the microscope.   Cultures will help complete the exact diagnosis.  TREATMENT  Treatment depends on the cause of your vaginitis. Your treatment may include a medicine that kills germs (antibiotic). The antibiotic may be a shot, a pill, and/or vaginal suppository or cream. It is not uncommon for more than one type of infection to be present. If more than one infection is present, two or more  medications may be required. Reoccurrence of vaginal infections may be treated with vaginal suppositories or vaginal cream 2 times a week, or as directed. If your caregiver finds that an STD exists, treatment of your sexual partner(s) is important. This is especially important for those infected with chlamydia, gonorrhea, trichomoniasis, bacterial vaginosis, syphilis and HIV infections. Treating sexual partners will prevent you from being re-infected and help stop the spread of STD infection to others. Although it is best to see a specialist for STD/HIV testing and counseling, this is not always possible. Some states/provinces permit something called "expedited partner therapy." This kind of program permits you to deliver prescription(s) to a partner without the partner having to seek a formal medical exam.  HOME CARE INSTRUCTIONS   Take all prescribed medication.   If applicable, speak to your partner about recommended treatment.   Do not have sexual intercourse for one week, or as directed by your caregiver.   Practice safe sex.   Use condoms.   Have only one sex partner.   Make sure your sex partner does not have any other sex partners.   Avoid tight pants and panty hose.   Wear cotton underwear.   Do not douche.   Avoid tampons, especially scented ones.   Take warm sitz baths.   Avoid vaginal sprays and perfumed soaps or bath oils.   Apply medicated cream (steroid cream) for itching or irritation with the permission of your caregiver.  SEEK MEDICAL CARE IF:     You have any kind of abnormal vaginal discharge.   Your sex partner has a genital infection.   You have pain or bleeding with sexual intercourse.   You have itching, pain, irritation or bleeding of the vulva.  SEEK IMMEDIATE MEDICAL CARE IF:   You have an oral temperature above 102 F (38.9 C), not controlled by medicine.   You have belly (abdominal) pain.   Symptoms do not improve within 3 days or as directed.    You develop painful or bloody urine.   You develop rectal pain, bleeding or discharge.  Document Released: 04/24/2005 Document Revised: 03/27/2011 Document Reviewed: 12/08/2008 [employer] Patient Information 2012 [employer]. 

## 2011-08-28 NOTE — Progress Notes (Signed)
Latoya Palmer [id number]  Chief Complaint  Patient presents with  . Referral    seen @ [facility]  . Gynecologic Exam    last pap @ [facility]- abnormal  . Vaginal Discharge    white, odor    SUBJECTIVE  HPI:  Presents for a followup Pap smear. Last Pap was November 2011 and showed "precancerous cells." She had a previous abnormal Pap smear and colposcopy in 2010 when she was followed at [facility]. Her lack of followup was due to loss of insurance.  She is concerned that she has had frequent recurrent bouts of bacterial vaginitis. She has  tried multiple treatments, most recently one month ago used oral clindamycin and this was not effective. She is now reporting malodorous discharge. She is using abstinence for contraception and has had no intercourse for about 6 months. Prior to that was on NuvaRing. Had negative GC chlamydia in December 2012.    Past Medical History  Diagnosis Date  . Headache   . Heart murmur   . Anemia   . Ovarian cyst   . Abnormal Pap smear   . Preterm labor    Past Surgical History  Procedure Date  . Myringotomy   . Therapeutic abortion     two  . Colposcopy    History   Social History  . Marital Status: Single    Spouse Name: N/A    Number of Children: N/A  . Years of Education: N/A   Occupational History  . Not on file.   Social History Main Topics  . Smoking status: Current Everyday Smoker -- 0.5 packs/day for 4 years    Types: Cigarettes  . Smokeless tobacco: Never Used  . Alcohol Use: 1.2 oz/week    1 Glasses of wine, 1 Shots of liquor per week     twice a month, social  . Drug Use: No  . Sexually Active: No     nuvaring, ran out this cycle   Other Topics Concern  . Not on file   Social History Narrative  . No narrative on file   Current Outpatient Prescriptions on File Prior to Visit  Medication Sig Dispense Refill  . ibuprofen (ADVIL,MOTRIN) 200 MG tablet Take 200 mg by mouth every 6 (six) hours as needed. For pain         Allergies  Allergen Reactions  . Flagyl (Metronidazole Hcl) Other (See Comments)    Pt states that 24 hours after taking (she is able to take it fine) she develops hives  . Latex Swelling and Other (See Comments)    Causes irritation    ROS: Pertinent items in HPI  OBJECTIVE  BP 114/76  Pulse 67  Temp(Src) 97.7 F (36.5 C) (Oral)  Resp 16  Ht 5\' 5"  (1.651 m)  Wt 123 lb 8 oz (56.019 kg)  BMI 20.55 kg/m2  LMP 08/22/2011  Physical Exam  Constitutional: She is oriented to person, place, and time and well-developed, well-nourished, and in no distress.  HENT:  Head: Normocephalic.  Neck: Normal range of motion. Neck supple.  Cardiovascular: Normal rate.   Pulmonary/Chest: Effort normal.  Abdominal: Soft. There is no tenderness.  Genitourinary:       NEFG. Vagina pink, rugated,  small amount whitish discharge, no malodor. Cervix parous os slightly everted, no lesions Uterus: NSSP Adnexae.: No masses or tenderness  Neurological: She is alert and oriented to person, place, and time.  Skin: Skin is warm and dry.  Psychiatric: Affect normal.  ASSESSMENT  Abnormal cervical cytology Recurrent BP   PLAN Wet prep sent. Treatment will be based on result. Pap sent. Followup as indicated ROI for records from [facility] and most recent Pap smear from the health department

## 2011-08-29 LAB — WET PREP, GENITAL: Trich, Wet Prep: NONE SEEN

## 2011-09-02 DIAGNOSIS — R87619 Unspecified abnormal cytological findings in specimens from cervix uteri: Secondary | ICD-10-CM

## 2011-09-18 ENCOUNTER — Ambulatory Visit: Payer: Self-pay | Admitting: Advanced Practice Midwife

## 2011-09-18 ENCOUNTER — Telehealth: Payer: Self-pay | Admitting: *Deleted

## 2011-09-18 MED ORDER — TINIDAZOLE 500 MG PO TABS: ORAL_TABLET | ORAL | Status: DC

## 2011-09-18 NOTE — Telephone Encounter (Signed)
Results reviewed and reported to [facility] CNM- order received for Tindamax.  She also would like pt to use Rephresh per package directions x3 wks and take a probiotic daily for 3 months.  I called pt and informed her of all instructions and Rx from [place]. Pt voiced understanding.

## 2011-09-18 NOTE — Telephone Encounter (Signed)
Pt left message requesting results from last visit. Also she wants Rx if she has a vaginal infection.

## 2011-10-10 ENCOUNTER — Other Ambulatory Visit: Payer: Self-pay | Admitting: Physician Assistant

## 2011-10-10 MED ORDER — METRONIDAZOLE 500 MG PO TABS: 500.0000 mg | ORAL_TABLET | Freq: Three times a day (TID) | ORAL | Status: DC

## 2011-10-10 MED ORDER — TINIDAZOLE 500 MG PO TABS: 2.0000 g | ORAL_TABLET | Freq: Every day | ORAL | Status: AC

## 2011-10-18 ENCOUNTER — Ambulatory Visit (INDEPENDENT_AMBULATORY_CARE_PROVIDER_SITE_OTHER): Payer: Medicaid Other | Admitting: Physician Assistant

## 2011-10-18 ENCOUNTER — Encounter: Payer: Self-pay | Admitting: Physician Assistant

## 2011-10-18 VITALS — BP 107/69 | HR 67 | Temp 97.3°F | Ht 65.0 in | Wt 122.9 lb

## 2011-10-18 DIAGNOSIS — F17200 Nicotine dependence, unspecified, uncomplicated: Secondary | ICD-10-CM

## 2011-10-18 DIAGNOSIS — N760 Acute vaginitis: Secondary | ICD-10-CM

## 2011-10-18 MED ORDER — VARENICLINE TARTRATE 0.5 MG X 11 & 1 MG X 42 PO MISC: ORAL | Status: DC

## 2011-10-18 NOTE — Patient Instructions (Addendum)
Use RePhresh Gel every 3 days for 2 weeks and ProB probiotic daily for 3 months     Vaginitis Vaginitis is an infection. It causes soreness, swelling, and redness (inflammation) of the vagina. Many of these infections are sexually transmitted diseases (STDs). Having unprotected sex can cause further problems and complications such as:  Chronic pelvic pain.   Infertility.   Unwanted pregnancy.   Abortion.   Tubal pregnancy.   Infection passed on to the newborn.   Cancer.  CAUSES   Monilia. This is a yeast or fungus infection, not an STD.   Bacterial vaginosis. The normal balance of bacteria in the vagina is disrupted and is replaced by an overgrowth of certain bacteria.   Gonorrhea, chlamydia. These are bacterial infections that are STDs.   Vaginal sponges, diaphragms, and intrauterine devices.   Trichomoniasis. This is a STD infection caused by a parasite.   Viruses like herpes and human papillomavirus. Both are STDs.   Pregnancy.   Immunosuppression. This occurs with certain conditions such as HIV infection or cancer.   Using bubble bath.   Taking certain antibiotic medicines.   Sporadic recurrence can occur if you become sick.   Diabetes.   Steroids.   Allergic reaction. If you have an allergy to:   Douches.   Soaps.   Spermicides.   Condoms.   Scented tampons or vaginal sprays.  SYMPTOMS   Abnormal vaginal discharge.   Itching of the vagina.   Pain in the vagina.   Swelling of the vagina.  In some cases, there are no symptoms. TREATMENT  Treatment will vary depending on the type of infection.  Bacteria or trichomonas are usually treated with oral antibiotics and sometimes vaginal cream or suppositories.   Monilia vaginitis is usually treated with vaginal creams, suppositories, or oral antifungal pills.   Viral vaginitis has no cure. However, the symptoms of herpes (a viral vaginitis) can be treated to relieve the discomfort. Human  papillomavirus has no symptoms. However, there are treatments for the diseases caused by human papillomavirus.   With allergic vaginitis, you need to stop using the product that is causing the problem. Vaginal creams can be used to treat the symptoms.   When treating an STD, the sex partner should also be treated.  HOME CARE INSTRUCTIONS   Take all the medicines as directed by your caregiver.   Do not use scented tampons, soaps, or vaginal sprays.   Do not douche.   Tell your sex partner if you have a vaginal infection or an STD.   Do not have sexual intercourse until you have treated the vaginitis.   Practice safe sex by using condoms.  SEEK MEDICAL CARE IF:   You have abdominal pain.   Your symptoms get worse during treatment.  Document Released: 05/12/2007 Document Revised: 07/04/2011 Document Reviewed: 01/05/2009 [employer] Patient Information 2012 [place], [place].

## 2011-10-18 NOTE — Progress Notes (Signed)
Chief Complaint:  Bacterial Vaginosis   Latoya Palmer is  26 y.o. [phone].  Patient's last menstrual period was 09/23/2011..    She presents complaining of Bacterial Vaginosis . Presents with complaint of vaginal discharge with fishy odor. Reports recurrent BV since birth of last child 5 years ago. States that she completed course of Tindamax 1 week ago without resolution of symptoms.   Obstetrical/Gynecological History: OB History    Grav Para Term Preterm Abortions TAB SAB Ect Mult Living   6 1 1  0 5 3 2  0 0 1      Past Medical History: Past Medical History  Diagnosis Date  . Headache   . Heart murmur   . Anemia   . Ovarian cyst   . Abnormal Pap smear   . Preterm labor     Past Surgical History: Past Surgical History  Procedure Date  . Myringotomy   . Therapeutic abortion     two  . Colposcopy     Family History: No family history on file.  Social History: History  Substance Use Topics  . Smoking status: Current Everyday Smoker -- 0.5 packs/day for 4 years    Types: Cigarettes  . Smokeless tobacco: Never Used  . Alcohol Use: 1.2 oz/week    1 Glasses of wine, 1 Shots of liquor per week     twice a month, social    Allergies:  Allergies  Allergen Reactions  . Flagyl (Metronidazole Hcl) Other (See Comments)    Pt states that 24 hours after taking (she is able to take it fine) she develops hives  . Latex Swelling and Other (See Comments)    Causes irritation     (Not in a hospital admission)  Review of Systems - Negative except what has been reviewed in HPI  Physical Exam   Blood pressure 107/69, pulse 67, temperature 97.3 F (36.3 C), height 5\' 5"  (1.651 m), weight 122 lb 14.4 oz (55.747 kg), last menstrual period 09/23/2011.  General: General appearance - alert, well appearing, and in no distress, oriented to person, place, and time and normal appearing weight Mental status - alert, oriented to person, place, and time, normal mood, behavior,  speech, dress, motor activity, and thought processes, affect appropriate to mood Abdomen - soft, nontender, nondistended, no masses or organomegaly Focused Gynecological Exam: VULVA: normal appearing vulva with no masses, tenderness or lesions, VAGINA: vaginal discharge - white, creamy and odorless, WET MOUNT done - results: DNA probe for chlamydia and GC obtained, pending results, CERVIX: normal appearing cervix without discharge or lesions, UTERUS: uterus is normal size, shape, consistency and nontender, ADNEXA: normal adnexa in size, nontender and no masses   Assessment: 1. Vaginitis  Wet prep, genital, GC/chlamydia probe amp, genital  2. Smokes and motivated to quit  varenicline (CHANTIX STARTING MONTH PAK) 0.5 MG X 11 & 1 MG X 42 tablet      Plan: Start RepHresh q 3 days x 2 weeks and daily probiotic times 3 months for recurrent vaginitis Will call with abnormal results when available Chantix sent to pharmacy at pt request for smoking cessation  Dierks Wach E. 10/18/2011,8:55 AM

## 2011-10-19 ENCOUNTER — Telehealth (HOSPITAL_COMMUNITY): Payer: Self-pay | Admitting: *Deleted

## 2011-10-19 LAB — GC/CHLAMYDIA PROBE AMP, GENITAL: Chlamydia, DNA Probe: NEGATIVE

## 2011-10-19 LAB — WET PREP, GENITAL
Trich, Wet Prep: NONE SEEN
Yeast Wet Prep HPF POC: NONE SEEN

## 2011-10-21 ENCOUNTER — Telehealth: Payer: Self-pay | Admitting: *Deleted

## 2011-10-21 DIAGNOSIS — F17200 Nicotine dependence, unspecified, uncomplicated: Secondary | ICD-10-CM

## 2011-10-21 MED ORDER — VARENICLINE TARTRATE 0.5 MG X 11 & 1 MG X 42 PO MISC: ORAL | Status: DC

## 2011-10-21 NOTE — Telephone Encounter (Signed)
Patient notified

## 2011-10-21 NOTE — Telephone Encounter (Signed)
Message copied by Mannie Stabile on Mon Oct 21, 2011  2:21 PM ------      Message from: August Luz      Created: Sat Oct 19, 2011  4:44 AM       Please notify pt. No medications needed

## 2011-10-21 NOTE — Telephone Encounter (Signed)
Pt left message stating that her Rx for Chantix was not sent to the pharmacy. My pharmacy is the [facility] on [address].  Please call back.   I returned pt call and left message that the Rx originally had not been sent to pharmacy, but had been ordered. I have sent it electronically today and she can pick it up later on. [facility] [address].

## 2011-11-21 ENCOUNTER — Telehealth: Payer: Self-pay | Admitting: *Deleted

## 2011-11-21 DIAGNOSIS — B9689 Other specified bacterial agents as the cause of diseases classified elsewhere: Secondary | ICD-10-CM

## 2011-11-21 MED ORDER — TINIDAZOLE 500 MG PO TABS: 1000.0000 mg | ORAL_TABLET | Freq: Two times a day (BID) | ORAL | Status: AC

## 2011-11-21 NOTE — Telephone Encounter (Signed)
Patient called stating medication was sent to wrong pharmacy and needs to be sent to [facility].

## 2011-11-21 NOTE — Telephone Encounter (Signed)
I called pt and left message to call back and provide the street location of her pharmacy.

## 2011-11-21 NOTE — Telephone Encounter (Signed)
Patient returned call on call back line and advised pharmacy was [facility] on [address].

## 2011-11-21 NOTE — Telephone Encounter (Signed)
Patient called saying that she needs a prescription for BV, has taken Tindamax in the past without any problems.  Wet prep on 10/18/11 was negative, but she reports malodorous, itchy discharge that is typical for her recurrent BV episodes.  Tindamax e-prescribed for her. Told to call back for any worsening symptoms.

## 2011-11-25 NOTE — Telephone Encounter (Signed)
Called pt to verify if she picked up her Rx pt informed me that she did and had no further questions.

## 2011-12-06 ENCOUNTER — Encounter (HOSPITAL_COMMUNITY): Payer: Self-pay

## 2011-12-06 ENCOUNTER — Emergency Department (HOSPITAL_COMMUNITY)
Admission: EM | Admit: 2011-12-06 | Discharge: 2011-12-06 | Disposition: A | Discharge status: Home / Self Care | Payer: Medicaid Other | Source: Home / Self Care | Attending: Family Medicine | Admitting: Family Medicine

## 2011-12-06 DIAGNOSIS — R51 Headache: Secondary | ICD-10-CM

## 2011-12-06 MED ORDER — KETOROLAC TROMETHAMINE 60 MG/2ML IM SOLN
INTRAMUSCULAR | Status: AC
  Filled 2011-12-06: qty 2

## 2011-12-06 MED ORDER — KETOROLAC TROMETHAMINE 60 MG/2ML IM SOLN
60.0000 mg | Freq: Once | INTRAMUSCULAR | Status: AC
  Administered 2011-12-06: 60 mg via INTRAMUSCULAR

## 2011-12-06 MED ORDER — SUMATRIPTAN SUCCINATE 100 MG PO TABS: 100.0000 mg | ORAL_TABLET | ORAL | Status: DC | PRN

## 2011-12-06 NOTE — ED Provider Notes (Signed)
History     CSN: [id number]  Arrival date & time 12/06/11  1658   First MD Initiated Contact with Patient 12/06/11 1815      Chief Complaint  Patient presents with  . Migraine    (Consider location/radiation/quality/duration/timing/severity/associated sxs/prior treatment) Patient is a 26 y.o. female presenting with migraine. The history is provided by the patient. No language interpreter was used.  Migraine This is a new problem. The current episode started 2 days ago. The problem occurs constantly. The problem has not changed since onset.Associated symptoms include headaches. Pertinent negatives include no chest pain. The symptoms are aggravated by nothing. The symptoms are relieved by nothing. She has tried acetaminophen for the symptoms.  Pt reports she has a history of migranes on and off  Past Medical History  Diagnosis Date  . Headache   . Heart murmur   . Anemia   . Ovarian cyst   . Abnormal Pap smear   . Preterm labor     Past Surgical History  Procedure Date  . Myringotomy   . Therapeutic abortion     two  . Colposcopy     History reviewed. No pertinent family history.  History  Substance Use Topics  . Smoking status: Current Everyday Smoker -- 0.5 packs/day for 4 years    Types: Cigarettes  . Smokeless tobacco: Never Used  . Alcohol Use: 1.2 oz/week    1 Glasses of wine, 1 Shots of liquor per week     twice a month, social    OB History    Grav Para Term Preterm Abortions TAB SAB Ect Mult Living   6 1 1  0 5 3 2  0 0 1      Review of Systems  Cardiovascular: Negative for chest pain.  Neurological: Positive for headaches.  All other systems reviewed and are negative.    Allergies  Flagyl and Latex  Home Medications   Current Outpatient Rx  Name Route Sig Dispense Refill  . ACETAMINOPHEN-CODEINE #3 300-30 MG PO TABS Oral Take 1 tablet by mouth every 4 (four) hours as needed.    . IBUPROFEN 200 MG PO TABS Oral Take 200 mg by mouth every 6  (six) hours as needed. For pain     . VARENICLINE TARTRATE 0.5 MG X 11 & 1 MG X 42 PO MISC  Take one 0.5 mg tablet by mouth once daily for 3 days, then increase to one 0.5 mg tablet twice daily for 4 days, then increase to one 1 mg tablet twice daily. 53 tablet 0    BP 149/97  Pulse 72  Temp(Src) 98.9 F (37.2 C) (Oral)  Resp 16  SpO2 100%  LMP 11/23/2011  Physical Exam  Vitals reviewed. Constitutional: She appears well-developed and well-nourished.  HENT:  Head: Normocephalic and atraumatic.  Right Ear: External ear normal.  Left Ear: External ear normal.  Nose: Nose normal.  Mouth/Throat: Oropharynx is clear and moist.  Eyes: Conjunctivae and EOM are normal. Pupils are equal, round, and reactive to light.  Neck: Normal range of motion. Neck supple.  Cardiovascular: Normal rate.   Pulmonary/Chest: Effort normal.  Abdominal: Soft.  Musculoskeletal: Normal range of motion.  Neurological: She is alert.  Skin: Skin is warm.  Psychiatric: She has a normal mood and affect.    ED Course  Procedures (including critical care time)  Labs Reviewed - No data to display No results found.   No diagnosis found.    MDM  Pt is driving.  Pt given torodol here.   Pt given rx for imitrex.   I advised follow up with [facility]        Lonia Skinner [place], [place] 12/06/11 1845

## 2011-12-06 NOTE — ED Notes (Addendum)
C/o HA since earlier today; yrs ago was seeing [facility], but it has been a while has had 2 in past 3 weeks ; HA exacerbated by bright lights, noises; under a lot of stress at job. Used left over tyl #3 from root canal for pani but did not want to take more

## 2011-12-06 NOTE — Discharge Instructions (Signed)

## 2011-12-07 NOTE — ED Provider Notes (Signed)
Medical screening examination/treatment/procedure(s) were performed by non-physician practitioner and as supervising physician I was immediately available for consultation/collaboration.   MORENO-COLL,Sabria Florido; MD   Mitzy Naron Moreno-Coll, MD 12/07/11 1408 

## 2011-12-11 ENCOUNTER — Other Ambulatory Visit: Payer: Self-pay | Admitting: *Deleted

## 2011-12-11 DIAGNOSIS — F17200 Nicotine dependence, unspecified, uncomplicated: Secondary | ICD-10-CM

## 2011-12-11 NOTE — Telephone Encounter (Signed)
Patient called and left a message requesting a refill for her chantix, last seen in office 10/18/11

## 2011-12-12 NOTE — Telephone Encounter (Signed)
Pt was to FU with PCP for ongoing management with Chantix.

## 2011-12-13 NOTE — Telephone Encounter (Signed)
Called patient and left a message we have gotten your message and our provider requests that you follow up with your primary care provider for refill and management- please call if you have any questions.

## 2012-01-01 ENCOUNTER — Telehealth: Payer: Self-pay

## 2012-01-01 NOTE — Telephone Encounter (Signed)
Pt called and stated that she has a question regarding a Rx.  Can someone please call back?  Called pt and pt informed me that she was taking chantix to quit smoking and she just found out that she is pregnant with a @ home pregnancy test.  Pt also stated that she has an appt on 01/22/12 @ 215pm here at the clinics for pregnancy test.  Pt stated that she wanted to start care here.  I advised pt to stop taking the chantix due to her positive pregnancy test and to start prenatal care at the [facility] in which they will refer her here for high risk pregnancy.  Pt informed me that she was in our high risk clinics with last pregnancy. Pt stated understanding and had no further questions.

## 2012-01-09 ENCOUNTER — Telehealth: Payer: Self-pay

## 2012-01-09 DIAGNOSIS — F17200 Nicotine dependence, unspecified, uncomplicated: Secondary | ICD-10-CM

## 2012-01-09 MED ORDER — VARENICLINE TARTRATE 0.5 MG X 11 & 1 MG X 42 PO MISC: ORAL | Status: AC

## 2012-01-09 NOTE — Telephone Encounter (Signed)
Pt called and stated can someone give me a call.

## 2012-01-09 NOTE — Telephone Encounter (Signed)
Called pt and pt stated that she wanted her Chantix to be [facility] into [facility] because [facility].  I informed pt that per provider, Maylon Cos, will change the Rx to [facility] but will need to follow up with PCP concerning the Rx for chantix from this point on. Pt stated understanding and had no further questions.

## 2012-01-22 ENCOUNTER — Encounter: Payer: Medicaid Other | Admitting: Physician Assistant

## 2012-02-04 ENCOUNTER — Telehealth: Payer: Self-pay | Admitting: *Deleted

## 2012-02-04 MED ORDER — CLINDAMYCIN HCL 300 MG PO CAPS: 300.0000 mg | ORAL_CAPSULE | Freq: Two times a day (BID) | ORAL | Status: AC

## 2012-02-04 MED ORDER — TINIDAZOLE 500 MG PO TABS: 1000.0000 mg | ORAL_TABLET | Freq: Every day | ORAL | Status: AC

## 2012-02-04 NOTE — Telephone Encounter (Signed)
1300-  I spoke w/Heather who stated that the pt has called her again regarding the matter of her medication. She informed pt that she could get her Rx from the [facility] outpatient pharmacy and it would only cost her $46.  Pt stated that this is still too much and she does not have anyone that she can borrow the money from. Heather informed me that she has confirmed with her supervisor that this pt's medication cannot be paid out of "indigent funds". She encouraged pt to wait for my return call later today.

## 2012-02-04 NOTE — Telephone Encounter (Signed)
Pt arrived to clinic @ 1220 asking for assistance to obtain Tindamax to treat her BV.  She has a long standing history of recurrent BV.  She has allergy to Flagyl and states that clindamycin did not work for her in the past, partly because she had been prescribed it so many times. Tindamax was  prescribed for her by Sharen Counter but pt has now learned that her medicaid is expired and she cannot afford the out of pocket price of $80.  She has re-applied for medicaid and of course has been told that it will take up to 45 days to be completed.  Pt is upset that the hospital cannot obtain and/or pay for the medication for her unless she is actually being seen or admitted. (this info from [facility]- Child psychotherapist)  Pt states that Tindamax is the only thing that will work for her and is resistant to discussion about other options.  I paged MD on call but she was in the OR. Pt stated that she needed to get to work.  I told pt that I will speak with the doctor on call to see if there are any other medications which can be prescribed. I would call her back later today.

## 2012-02-04 NOTE — Telephone Encounter (Signed)
1620- I spoke w/Dr. Erin Fulling and  Provided pt's history. She would like pt to try Clindamycin again- order received.  I called pt and left a voice mail of Rx given by MD on call. I stated that it is important to try this medication again because it may work for her or atleast relieve her sx long enough until her medicaid id renewed. It is very possible that she has re-gained a sensitivity to the medication since she has not had it for awhile.  I stated that I will send the Rx to her pharmacy on file. If desired, she may have her Rx transferred to [facility] or [facility] if it would be less expensive.

## 2012-02-13 NOTE — Telephone Encounter (Signed)
This encounter appears to be a telephone call regarding a prescription needed for Clindamycin since she says she was allergic to Flagyl.

## 2012-02-28 ENCOUNTER — Ambulatory Visit: Payer: Medicaid Other | Admitting: Advanced Practice Midwife

## 2012-02-28 ENCOUNTER — Telehealth: Payer: Self-pay | Admitting: *Deleted

## 2012-02-28 NOTE — Telephone Encounter (Signed)
Received a call transfer from front desk- Karenna called and states she was unable to come to her appointment today because she did not have a babysitter for her 27 year old daughter and her appointment was scheduled for medication problem.  States she has recurrent BV and was last prescribed  Tindazole which she started Tuesday- states it makes her sick to her stomach- nausea and vomiting. We discussed taking it only after eating a meal- states she has taken it before eating, during a meal and after and it makes her sick after eating a meal as well as if she takes it befor a meal.  Discussed she has tried Clindamycin, is allergic to flagyl, eats yougurt, has used a douche, uses cotton underwear ,does not swith soaps and detergents.  Discussed using home remedy of vinegar/water soaked tampons.  Infomred patient not sure that there is anything else she can do, will talk to provider , but encouraged take tindazole only after meals.  Patient states wants reschedule- transferred to front desk to reschedule. Talked with provider and instructed to tell patient to come in- but explain is not infection, does not have to be treated, etc. Call transferred back after appt scheduled and patient very irate- attempted to explain to patient I understand her frustration- but this is not infection, is not urgent , is ok to wait until appt.  Patient wanting a note - informed her we can not give her a note if we did not see her, and that we haven't seen her in clinic since March,. She states Rosalita Chessman said she didn't have to wait for a month to be seen for BV and that she is going to come to the hospital and wait until she is seen on Monday.  Attempted to discuss with patient but continued to be irate.  Informed her I would send a note to Rosalita Chessman to see if we could overbook her one day next week.

## 2012-03-02 ENCOUNTER — Ambulatory Visit: Payer: Medicaid Other | Admitting: Physician Assistant

## 2012-03-11 ENCOUNTER — Encounter: Payer: Self-pay | Admitting: Obstetrics and Gynecology

## 2012-03-11 ENCOUNTER — Telehealth: Payer: Self-pay | Admitting: Medical

## 2012-03-11 NOTE — Telephone Encounter (Signed)
Pt called stating that she needed to discuss a reaction to one of her medications. I returned the call to the patient and she explained her history with medications used to treat recurrent BV and the different reactions she has had with each. These are documented under previous telephone encounters. The patient requests a note for work explaining that she did have an adverse reaction to the tinidazole and that she is now ok to return to work. I will draft a letter for a provider to sign in clinic this afternoon and leave it at the front office for the patient to pick up in the morning. The patient voiced no other questions or concerns at this time.

## 2012-03-12 ENCOUNTER — Encounter: Payer: Self-pay | Admitting: Medical

## 2012-03-12 ENCOUNTER — Telehealth: Payer: Self-pay | Admitting: *Deleted

## 2012-03-12 NOTE — Telephone Encounter (Signed)
Returned pt call. She stated she was in the parking lot of the hospital and would come to clinic to discuss concerns.

## 2012-03-12 NOTE — Telephone Encounter (Signed)
Patient left a message that she picked up a letter this morning for her work. However it is not exactly what she needed. Would like someone to call her back and revise the letter.

## 2012-03-20 ENCOUNTER — Ambulatory Visit: Payer: Medicaid Other | Admitting: Obstetrics & Gynecology

## 2012-03-27 ENCOUNTER — Ambulatory Visit: Payer: Medicaid Other | Admitting: Obstetrics and Gynecology

## 2012-06-20 ENCOUNTER — Emergency Department (HOSPITAL_COMMUNITY): Payer: 59

## 2012-06-20 ENCOUNTER — Encounter (HOSPITAL_COMMUNITY): Payer: Self-pay | Admitting: Emergency Medicine

## 2012-06-20 ENCOUNTER — Emergency Department (HOSPITAL_COMMUNITY)
Admission: EM | Admit: 2012-06-20 | Discharge: 2012-06-20 | Disposition: A | Discharge status: Home / Self Care | Payer: 59 | Attending: Emergency Medicine | Admitting: Emergency Medicine

## 2012-06-20 DIAGNOSIS — R109 Unspecified abdominal pain: Secondary | ICD-10-CM | POA: Insufficient documentation

## 2012-06-20 DIAGNOSIS — Z8751 Personal history of pre-term labor: Secondary | ICD-10-CM | POA: Insufficient documentation

## 2012-06-20 DIAGNOSIS — Z8679 Personal history of other diseases of the circulatory system: Secondary | ICD-10-CM | POA: Insufficient documentation

## 2012-06-20 DIAGNOSIS — F17200 Nicotine dependence, unspecified, uncomplicated: Secondary | ICD-10-CM | POA: Insufficient documentation

## 2012-06-20 DIAGNOSIS — Z8742 Personal history of other diseases of the female genital tract: Secondary | ICD-10-CM | POA: Insufficient documentation

## 2012-06-20 DIAGNOSIS — Z862 Personal history of diseases of the blood and blood-forming organs and certain disorders involving the immune mechanism: Secondary | ICD-10-CM | POA: Insufficient documentation

## 2012-06-20 DIAGNOSIS — O039 Complete or unspecified spontaneous abortion without complication: Secondary | ICD-10-CM

## 2012-06-20 LAB — CBC WITH DIFFERENTIAL/PLATELET
Eosinophils Absolute: 0.1 10*3/uL (ref 0.0–0.7)
Eosinophils Relative: 2 % (ref 0–5)
HCT: 30.3 % — ABNORMAL LOW (ref 36.0–46.0)
Hemoglobin: 10.3 g/dL — ABNORMAL LOW (ref 12.0–15.0)
Lymphs Abs: 2.4 10*3/uL (ref 0.7–4.0)
MCH: 29.2 pg (ref 26.0–34.0)
MCV: 85.8 fL (ref 78.0–100.0)
Monocytes Relative: 7 % (ref 3–12)
RBC: 3.53 MIL/uL — ABNORMAL LOW (ref 3.87–5.11)

## 2012-06-20 LAB — URINALYSIS, ROUTINE W REFLEX MICROSCOPIC
Specific Gravity, Urine: 1.04 — ABNORMAL HIGH (ref 1.005–1.030)
Urobilinogen, UA: 1 mg/dL (ref 0.0–1.0)

## 2012-06-20 LAB — BASIC METABOLIC PANEL
BUN: 10 mg/dL (ref 6–23)
CO2: 26 mEq/L (ref 19–32)
Calcium: 9.4 mg/dL (ref 8.4–10.5)
GFR calc non Af Amer: >90 mL/min (ref >90)
Glucose, Bld: 90 mg/dL (ref 70–99)

## 2012-06-20 LAB — ABO/RH: ABO/RH(D): O POS

## 2012-06-20 LAB — WET PREP, GENITAL: Clue Cells Wet Prep HPF POC: NONE SEEN

## 2012-06-20 LAB — POCT PREGNANCY, URINE: Preg Test, Ur: POSITIVE — AB

## 2012-06-20 LAB — HCG, QUANTITATIVE, PREGNANCY: hCG, Beta Chain, Quant, S: 1329 m[IU]/mL — ABNORMAL HIGH (ref <5)

## 2012-06-20 LAB — URINE MICROSCOPIC-ADD ON

## 2012-06-20 NOTE — ED Provider Notes (Signed)
History     CSN: [id number]  Arrival date & time 06/20/12  1630   First MD Initiated Contact with Patient 06/20/12 1826     Chief Complaint  Patient presents with  . Vaginal Bleeding   HPI: Latoya Palmer is a 26 yo AAF, G6P1, presents for evaluation of vaginal bleeding. Her normal menstrual cycle ended three days ago. Yesterday afternoon she began to bleed again. Bleeding has been heavy, dark brown. Associated with transient abdominal cramping. Last episode of abdominal cramping was this AM. While visiting her grandfather upstairs she had another episode of bleeding so she presents for evaluation. She denies any presyncopal symptoms, chest pain or SOB. Further denies nausea, vomiting, diarrhea, dysuria or vaginal discharge.   Past Medical History  Diagnosis Date  . Headache   . Heart murmur   . Anemia   . Ovarian cyst   . Abnormal Pap smear   . Preterm labor     Past Surgical History  Procedure Date  . Myringotomy   . Therapeutic abortion     two  . Colposcopy     History reviewed. No pertinent family history.  History  Substance Use Topics  . Smoking status: Current Every Day Smoker -- 0.5 packs/day for 4 years    Types: Cigarettes  . Smokeless tobacco: Never Used  . Alcohol Use: 1.2 oz/week    1 Glasses of wine, 1 Shots of liquor per week     Comment: twice a month, social    OB History    Grav Para Term Preterm Abortions TAB SAB Ect Mult Living   6 1 1  0 5 3 2  0 0 1     Review of Systems  Constitutional: Negative for fever, chills and fatigue.  HENT: Negative for congestion and rhinorrhea.   Eyes: Negative for photophobia and visual disturbance.  Respiratory: Negative for cough, shortness of breath and wheezing.   Cardiovascular: Negative for chest pain and palpitations.  Gastrointestinal: Positive for abdominal pain (transient episodes). Negative for nausea and vomiting.  Genitourinary: Positive for vaginal bleeding. Negative for dysuria, flank pain, vaginal  discharge, difficulty urinating, vaginal pain and pelvic pain.  Musculoskeletal: Negative for myalgias and arthralgias.  Skin: Negative for color change and pallor.  Neurological: Negative for dizziness, light-headedness and headaches.  Psychiatric/Behavioral: Negative for confusion and agitation.  All other systems reviewed and are negative.   Allergies  Flagyl and Latex  Home Medications  No current outpatient prescriptions on file.  BP 122/69  Pulse 103  Temp 98 F (36.7 C) (Oral)  Resp 16  SpO2 100%  Physical Exam  Nursing note and vitals reviewed. Constitutional: She is oriented to person, place, and time. She appears well-developed and well-nourished. She is cooperative. No distress.  HENT:  Head: Normocephalic and atraumatic.  Mouth/Throat: Mucous membranes are normal.  Eyes: Conjunctivae normal and EOM are normal. Pupils are equal, round, and reactive to light.  Cardiovascular: Regular rhythm, S1 normal, S2 normal, normal heart sounds and normal pulses.  Tachycardia present.   Pulmonary/Chest: Effort normal and breath sounds normal. She has no decreased breath sounds.  Abdominal: Soft. Normal appearance and bowel sounds are normal. There is no tenderness.  Genitourinary: Vagina normal. Pelvic exam was performed with patient supine. Cervix exhibits no motion tenderness and no discharge. No vaginal discharge found.       Dark red blood in vaginal vault, no bleeding from OS  Musculoskeletal: Normal range of motion. She exhibits no edema.  Neurological: She is alert  and oriented to person, place, and time.  Skin: Skin is warm and dry. No pallor.    ED Course  Procedures (including critical care time)  Labs Reviewed  CBC WITH DIFFERENTIAL - Abnormal; Notable for the following:    RBC 3.53 (*)     Hemoglobin 10.3 (*)     HCT 30.3 (*)     All other components within normal limits  BASIC METABOLIC PANEL - Abnormal; Notable for the following:    Potassium 3.4 (*)      All other components within normal limits  URINALYSIS, ROUTINE W REFLEX MICROSCOPIC - Abnormal; Notable for the following:    Color, Urine RED (*)  BIOCHEMICALS MAY BE AFFECTED BY COLOR   APPearance CLOUDY (*)     Specific Gravity, Urine 1.040 (*)     Hgb urine dipstick LARGE (*)     Bilirubin Urine SMALL (*)     Ketones, ur 15 (*)     Protein, ur 100 (*)     Leukocytes, UA SMALL (*)     All other components within normal limits  POCT PREGNANCY, URINE - Abnormal; Notable for the following:    Preg Test, Ur POSITIVE (*)     All other components within normal limits  HCG, QUANTITATIVE, PREGNANCY - Abnormal; Notable for the following:    hCG, Beta Chain, Quant, S 1329 (*)     All other components within normal limits  WET PREP, GENITAL - Abnormal; Notable for the following:    WBC, Wet Prep HPF POC FEW (*)     All other components within normal limits  PROTIME-INR  SAMPLE TO BLOOD BANK  URINE MICROSCOPIC-ADD ON  ABO/RH  GC/CHLAMYDIA PROBE AMP   US Ob Comp Less 14 Wks  06/20/2012  *RADIOLOGY REPORT*  Clinical Data: 26 year old female with vaginal bleeding. LMP unclear, recent bleeding. Quantitative beta HCG 13 29.  OBSTETRIC <14 WK [place] AND TRANSVAGINAL OB US  Technique:  Both transabdominal and transvaginal ultrasound examinations were performed for complete evaluation of the gestation as well as the maternal uterus, adnexal regions, and pelvic cul-de-sac.  Transvaginal technique was performed to assess early pregnancy.  Comparison:  Pelvic ultrasound 09/23/2007.  Intrauterine gestational sac:  No normal Yolk sac: None Embryo: None Cardiac Activity: None  Maternal uterus/adnexae: Heterogeneous endometrium at the level of the fundus.  Slightly complex area (image 38 and 47). Real time images suggest the possibility of some cardiac activity, but no normal gestational sac or other structures are identified. Trace pelvic free fluid. Both ovaries are normal with multiple small follicles.  The left  measures 3.1 x 2.0 x 2.8 cm.  The right measures 3.1 x 2.0 x 1.7 cm.  IMPRESSION: No normal IUP, and no adnexal mass.  Trace free fluid.  Small complex area identified in the endometrium near the fundus. Constellation of findings favors a spontaneous abortion in progress. Recommend correlation with serial quantitative BHCG and followup imaging as indicated.  Study discussed with Dr. Vanetta Mulders at the time of dictation.   Original Report Authenticated By: Erskine Speed, M.D.    US Ob Transvaginal  06/20/2012  *RADIOLOGY REPORT*  Clinical Data: 26 year old female with vaginal bleeding. LMP unclear, recent bleeding. Quantitative beta HCG 13 29.  OBSTETRIC <14 WK [place] AND TRANSVAGINAL OB US  Technique:  Both transabdominal and transvaginal ultrasound examinations were performed for complete evaluation of the gestation as well as the maternal uterus, adnexal regions, and pelvic cul-de-sac.  Transvaginal technique was performed to assess  early pregnancy.  Comparison:  Pelvic ultrasound 09/23/2007.  Intrauterine gestational sac:  No normal Yolk sac: None Embryo: None Cardiac Activity: None  Maternal uterus/adnexae: Heterogeneous endometrium at the level of the fundus.  Slightly complex area (image 38 and 47). Real time images suggest the possibility of some cardiac activity, but no normal gestational sac or other structures are identified. Trace pelvic free fluid. Both ovaries are normal with multiple small follicles.  The left measures 3.1 x 2.0 x 2.8 cm.  The right measures 3.1 x 2.0 x 1.7 cm.  IMPRESSION: No normal IUP, and no adnexal mass.  Trace free fluid.  Small complex area identified in the endometrium near the fundus. Constellation of findings favors a spontaneous abortion in progress. Recommend correlation with serial quantitative BHCG and followup imaging as indicated.  Study discussed with Dr. Vanetta Mulders at the time of dictation.   Original Report Authenticated By: Erskine Speed, M.D.      1.  Miscarriage     MDM  26 yo AAF, G6P1, presents for evaluation of vaginal bleeding. Afebrile, vital signs stable. POCT pregnancy test positive. Concern for threatened abortion vs ectopic vs IUP. Pelvic exam revealed blood in vaginal vault, OS closed, no active bleeding. HCG 1329, obtained pelvic US which showed complex fluid in endometrium, but no definite IUP or ectopic. Likely completed abortion. Rh+ so no RhoGam indicated. Wet prep without infection. UA c/w dehydration but no infection. K 3.4, but remainder of BMP normal. Hgb 10.3. Patient felt to be stable for outpatient management as she is HD stable, no active bleeding, asymptomatic, and close outpatient f/u. She will f/u with her regular OB on Monday for repeat HCG. Return precautions to include abdominal pain, worsening bleeding, SOB, syncope were given. Patient in agreement with plan.   Reviewed imaging, labs and previous medical records utilized in MDM  Clinical Impression 1. Completed abortion.          Margie Billet, MD 06/21/12 [phone]

## 2012-06-20 NOTE — ED Notes (Signed)
Pt c/o heavy vaginal bleeding with clots and cramping x 2 days; pt sts LMP was last week; pt sts hx of hemorrhage in past

## 2012-06-21 LAB — SAMPLE TO BLOOD BANK

## 2012-06-21 NOTE — ED Provider Notes (Signed)
I saw and evaluated the patient, reviewed the resident's note and I agree with the findings and plan.  Patient with [place] without identifiable IUP but no pain so most likely miscarriage in process but follow up and ectopic precautions given.   Shelda Jakes, MD 06/21/12 [phone]

## 2012-08-31 ENCOUNTER — Encounter: Payer: Self-pay | Admitting: Physician Assistant

## 2012-08-31 NOTE — Progress Notes (Signed)
This encounter was created in error - please disregard.

## 2012-09-16 ENCOUNTER — Ambulatory Visit: Payer: 59 | Admitting: Obstetrics and Gynecology

## 2012-09-28 ENCOUNTER — Ambulatory Visit: Payer: 59 | Admitting: Obstetrics & Gynecology

## 2012-10-07 ENCOUNTER — Encounter: Payer: Self-pay | Admitting: Obstetrics & Gynecology

## 2012-10-07 ENCOUNTER — Ambulatory Visit (INDEPENDENT_AMBULATORY_CARE_PROVIDER_SITE_OTHER): Payer: 59 | Admitting: Obstetrics & Gynecology

## 2012-10-07 VITALS — BP 118/84 | HR 68 | Temp 97.6°F | Resp 20 | Ht 65.0 in | Wt 124.2 lb

## 2012-10-07 DIAGNOSIS — N760 Acute vaginitis: Secondary | ICD-10-CM

## 2012-10-07 DIAGNOSIS — B9689 Other specified bacterial agents as the cause of diseases classified elsewhere: Secondary | ICD-10-CM

## 2012-10-07 DIAGNOSIS — A499 Bacterial infection, unspecified: Secondary | ICD-10-CM

## 2012-10-07 LAB — POCT URINALYSIS DIP (DEVICE)
Bilirubin Urine: NEGATIVE
Glucose, UA: NEGATIVE mg/dL
Ketones, ur: NEGATIVE mg/dL
Nitrite: NEGATIVE
pH: 7 (ref 5.0–8.0)

## 2012-10-07 MED ORDER — TINIDAZOLE 250 MG PO TABS: 250.0000 mg | ORAL_TABLET | Freq: Every day | ORAL | Status: DC

## 2012-10-07 NOTE — Progress Notes (Signed)
Pt reports having strong odor to urine- CCUA obtained, UA done. Pt also reports vag d/c and irritation x2 weeks- have frequent occurrence of BV.

## 2012-10-07 NOTE — Progress Notes (Signed)
Patient ID: Latoya Palmer, female   DOB: [birth year], 27 y.o.   MRN: [id number]  Chief Complaint  Patient presents with  . Follow-up    birth control & poss. BV    HPI ZAYDAH Palmer is a 27 y.o. female.  Patient's last menstrual period was 10/04/2012. [id number] Long history of recurrent BV, wants to try long term supression.   HPI  Past Medical History  Diagnosis Date  . Headache   . Heart murmur   . Anemia   . Ovarian cyst   . Abnormal Pap smear   . Preterm labor     Past Surgical History  Procedure Laterality Date  . Myringotomy    . Therapeutic abortion      two  . Colposcopy      History reviewed. No pertinent family history.  Social History History  Substance Use Topics  . Smoking status: Current Every Day Smoker -- 0.50 packs/day for 4 years    Types: Cigarettes  . Smokeless tobacco: Never Used  . Alcohol Use: 1.2 oz/week    1 Glasses of wine, 1 Shots of liquor per week     Comment: twice a month, social    Allergies  Allergen Reactions  . Flagyl (Metronidazole Hcl) Other (See Comments)    Pt states that 24 hours after taking (she is able to take it fine) she develops hives  . Latex Swelling and Other (See Comments)    Causes irritation    Current Outpatient Prescriptions  Medication Sig Dispense Refill  . tinidazole (TINDAMAX) 250 MG tablet Take 1 tablet (250 mg total) by mouth daily with breakfast.  30 tablet  2   No current facility-administered medications for this visit.    Review of Systems Review of Systems  Constitutional: Negative for fever.  Genitourinary: Positive for vaginal discharge (with odor). Negative for dysuria, frequency, vaginal pain, menstrual problem and pelvic pain.    Blood pressure 118/84, pulse 68, temperature 97.6 F (36.4 C), temperature source Oral, resp. rate 20, height 5\' 5"  (1.651 m), weight 124 lb 3.2 oz (56.337 kg), last menstrual period 10/04/2012.  Physical Exam Physical Exam  Constitutional: She is  oriented to person, place, and time. She appears well-developed and well-nourished. No distress.  Abdominal: Soft. She exhibits no distension. There is no tenderness.  Genitourinary: Uterus normal.  Light vaginal bleeding, odor. BD affirm done  Neurological: She is alert and oriented to person, place, and time.  Skin: Skin is warm and dry.  Psychiatric: She has a normal mood and affect. Her behavior is normal.    Data Reviewed Labs, pap  Assessment    Recurrent BV     Plan    Tinidazole 250 mg po daily, 30 tabs, 2 refills F/U as indicated        ARNOLD,JAMES 10/07/2012, 4:10 PM

## 2012-10-07 NOTE — Patient Instructions (Signed)
Bacterial Vaginosis Bacterial vaginosis (BV) is a vaginal infection where the normal balance of bacteria in the vagina is disrupted. The normal balance is then replaced by an overgrowth of certain bacteria. There are several different kinds of bacteria that can cause BV. BV is the most common vaginal infection in women of childbearing age. CAUSES   The cause of BV is not fully understood. BV develops when there is an increase or imbalance of harmful bacteria.  Some activities or behaviors can upset the normal balance of bacteria in the vagina and put women at increased risk including:  Having a new sex partner or multiple sex partners.  Douching.  Using an intrauterine device (IUD) for contraception.  It is not clear what role sexual activity plays in the development of BV. However, women that have never had sexual intercourse are rarely infected with BV. Women do not get BV from toilet seats, bedding, swimming pools or from touching objects around them.  SYMPTOMS   Grey vaginal discharge.  A fish-like odor with discharge, especially after sexual intercourse.  Itching or burning of the vagina and vulva.  Burning or pain with urination.  Some women have no signs or symptoms at all. DIAGNOSIS  Your caregiver must examine the vagina for signs of BV. Your caregiver will perform lab tests and look at the sample of vaginal fluid through a microscope. They will look for bacteria and abnormal cells (clue cells), a pH test higher than 4.5, and a positive amine test all associated with BV.  RISKS AND COMPLICATIONS   Pelvic inflammatory disease (PID).  Infections following gynecology surgery.  Developing HIV.  Developing herpes virus. TREATMENT  Sometimes BV will clear up without treatment. However, all women with symptoms of BV should be treated to avoid complications, especially if gynecology surgery is planned. Female partners generally do not need to be treated. However, BV may spread  between female sex partners so treatment is helpful in preventing a recurrence of BV.   BV may be treated with antibiotics. The antibiotics come in either pill or vaginal cream forms. Either can be used with nonpregnant or pregnant women, but the recommended dosages differ. These antibiotics are not harmful to the baby.  BV can recur after treatment. If this happens, a second round of antibiotics will often be prescribed.  Treatment is important for pregnant women. If not treated, BV can cause a premature delivery, especially for a pregnant woman who had a premature birth in the past. All pregnant women who have symptoms of BV should be checked and treated.  For chronic reoccurrence of BV, treatment with a type of prescribed gel vaginally twice a week is helpful. HOME CARE INSTRUCTIONS   Finish all medication as directed by your caregiver.  Do not have sex until treatment is completed.  Tell your sexual partner that you have a vaginal infection. They should see their caregiver and be treated if they have problems, such as a mild rash or itching.  Practice safe sex. Use condoms. Only have 1 sex partner. PREVENTION  Basic prevention steps can help reduce the risk of upsetting the natural balance of bacteria in the vagina and developing BV:  Do not have sexual intercourse (be abstinent).  Do not douche.  Use all of the medicine prescribed for treatment of BV, even if the signs and symptoms go away.  Tell your sex partner if you have BV. That way, they can be treated, if needed, to prevent reoccurrence. SEEK MEDICAL CARE IF:     Your symptoms are not improving after 3 days of treatment.  You have increased discharge, pain, or fever. MAKE SURE YOU:   Understand these instructions.  Will watch your condition.  Will get help right away if you are not doing well or get worse. FOR MORE INFORMATION  Division of STD Prevention (DSTDP), Centers for Disease Control and Prevention:  www.cdc.gov/std American Social Health Association (ASHA): www.ashastd.org  Document Released: 07/15/2005 Document Revised: 10/07/2011 Document Reviewed: 01/05/2009 Exit[employer] Patient Information 2013 [employer].  

## 2012-10-07 NOTE — Addendum Note (Signed)
Addended by: Soyla Murphy T on: 10/07/2012 04:40 PM   Modules accepted: Orders

## 2012-10-08 ENCOUNTER — Telehealth: Payer: Self-pay

## 2012-10-08 NOTE — Telephone Encounter (Signed)
Patient called and stated the prescription that was sent in for her was not for the right amount.  Upon review, she has recurrent BV and it has been decided she will seek a longer term treatment. She was prescribed Tindamax 250mg  daily with a 30 day supply and 2 refills. The patient was called and a message was left that we tried to contact her and for her to call us back.

## 2012-10-13 NOTE — Telephone Encounter (Signed)
Called pt and left message that her Rx has been sent to her pharmacy. Please call back and let us know that you have received this information.

## 2012-10-13 NOTE — Telephone Encounter (Signed)
Duplicate encounter

## 2012-10-15 LAB — WET PREP BY MOLECULAR PROBE
Candida species: NEGATIVE
Gardnerella vaginalis: POSITIVE — AB
Trichomonas vaginosis: NEGATIVE

## 2012-10-15 NOTE — Telephone Encounter (Signed)
Called patient and told her I was calling because I wanted to make sure she picked up her medicine from the pharmacy since I know there was a lot of confusion with that. Patient stated that she did pick it up and was grateful for the phone call and had no further questions

## 2012-12-14 ENCOUNTER — Ambulatory Visit: Payer: 59 | Admitting: Obstetrics & Gynecology

## 2013-06-28 ENCOUNTER — Encounter (HOSPITAL_COMMUNITY): Payer: Self-pay

## 2013-06-28 ENCOUNTER — Inpatient Hospital Stay (HOSPITAL_COMMUNITY)
Admission: AD | Admit: 2013-06-28 | Discharge: 2013-06-29 | Disposition: A | Discharge status: Home / Self Care | Payer: 59 | Source: Ambulatory Visit | Attending: Obstetrics & Gynecology | Admitting: Obstetrics & Gynecology

## 2013-06-28 ENCOUNTER — Inpatient Hospital Stay (HOSPITAL_COMMUNITY): Payer: 59

## 2013-06-28 DIAGNOSIS — R109 Unspecified abdominal pain: Secondary | ICD-10-CM | POA: Insufficient documentation

## 2013-06-28 DIAGNOSIS — O219 Vomiting of pregnancy, unspecified: Secondary | ICD-10-CM

## 2013-06-28 DIAGNOSIS — O26899 Other specified pregnancy related conditions, unspecified trimester: Secondary | ICD-10-CM

## 2013-06-28 DIAGNOSIS — N949 Unspecified condition associated with female genital organs and menstrual cycle: Secondary | ICD-10-CM | POA: Insufficient documentation

## 2013-06-28 DIAGNOSIS — O26859 Spotting complicating pregnancy, unspecified trimester: Secondary | ICD-10-CM | POA: Insufficient documentation

## 2013-06-28 LAB — CBC
HCT: 33.1 % — ABNORMAL LOW (ref 36.0–46.0)
Hemoglobin: 11.6 g/dL — ABNORMAL LOW (ref 12.0–15.0)
MCH: 29.1 pg (ref 26.0–34.0)
MCHC: 35 g/dL (ref 30.0–36.0)
MCV: 83.2 fL (ref 78.0–100.0)
Platelets: 226 10*3/uL (ref 150–400)
RBC: 3.98 MIL/uL (ref 3.87–5.11)
RDW: 12.6 % (ref 11.5–15.5)
WBC: 7.9 10*3/uL (ref 4.0–10.5)

## 2013-06-28 NOTE — MAU Note (Signed)
Pt LMP 05/14/2013, +UPT at home.  Pt having cramping and vomiting, unable to keep anything down.

## 2013-06-29 ENCOUNTER — Encounter (HOSPITAL_COMMUNITY): Payer: Self-pay

## 2013-06-29 DIAGNOSIS — O210 Mild hyperemesis gravidarum: Secondary | ICD-10-CM

## 2013-06-29 LAB — GC/CHLAMYDIA PROBE AMP: CT Probe RNA: NEGATIVE

## 2013-06-29 LAB — WET PREP, GENITAL: Yeast Wet Prep HPF POC: NONE SEEN

## 2013-06-29 LAB — ABO/RH: ABO/RH(D): O POS

## 2013-06-29 LAB — HCG, QUANTITATIVE, PREGNANCY: hCG, Beta Chain, Quant, S: 49315 m[IU]/mL — ABNORMAL HIGH (ref <5)

## 2013-06-29 MED ORDER — ONDANSETRON 8 MG PO TBDP
8.0000 mg | ORAL_TABLET | Freq: Once | ORAL | Status: AC
  Administered 2013-06-29: 8 mg via ORAL
  Filled 2013-06-29: qty 1

## 2013-06-29 MED ORDER — PROMETHAZINE HCL 12.5 MG PO TABS: 12.5000 mg | ORAL_TABLET | Freq: Four times a day (QID) | ORAL | Status: DC | PRN

## 2013-06-29 MED ORDER — ONDANSETRON HCL 4 MG PO TABS: 4.0000 mg | ORAL_TABLET | Freq: Three times a day (TID) | ORAL | Status: DC | PRN

## 2013-06-29 NOTE — MAU Provider Note (Signed)
Attestation of Attending Supervision of Advanced Practitioner (PA/CNM/NP): Evaluation and management procedures were performed by the Advanced Practitioner under my supervision and collaboration.  I have reviewed the Advanced Practitioner's note and chart, and I agree with the management and plan.  Bayli Quesinberry, MD, FACOG Attending Obstetrician & Gynecologist Faculty Practice, [facility]  

## 2013-06-29 NOTE — MAU Provider Note (Signed)
History     CSN: [id number]  Arrival date and time: 06/28/13 2210   None     Chief Complaint  Patient presents with  . Possible Pregnancy  . Abdominal Cramping  . Emesis   Possible Pregnancy Associated symptoms include vomiting.  Abdominal Cramping Associated symptoms include vomiting.  Emesis     Latoya Palmer is a 27 y.o. [phone] at [redacted]w[redacted]d who presents today with cramping, spotting and nausea/vominting. She states that she has vomited 4x in the last 24 hours.   Past Medical History  Diagnosis Date  . Headache(784.0)   . Heart murmur   . Anemia   . Ovarian cyst   . Abnormal Pap smear   . Preterm labor     Past Surgical History  Procedure Laterality Date  . Myringotomy    . Therapeutic abortion      two  . Colposcopy      History reviewed. No pertinent family history.  History  Substance Use Topics  . Smoking status: Former Smoker -- 0.50 packs/day for 4 years    Types: Cigarettes    Quit date: 05/12/2013  . Smokeless tobacco: Never Used  . Alcohol Use: 1.2 oz/week    1 Glasses of wine, 1 Shots of liquor per week     Comment: twice a month, social    Allergies:  Allergies  Allergen Reactions  . Flagyl [Metronidazole Hcl] Other (See Comments)    Pt states that 24 hours after taking (she is able to take it fine) she develops hives  . Latex Swelling and Other (See Comments)    Causes irritation    Prescriptions prior to admission  Medication Sig Dispense Refill  . ibuprofen (ADVIL,MOTRIN) 200 MG tablet Take 400 mg by mouth every 6 (six) hours as needed.        Review of Systems  Gastrointestinal: Positive for vomiting.   Physical Exam   Blood pressure 115/81, pulse 75, temperature 98.1 F (36.7 C), temperature source Oral, resp. rate 16, height 5\' 5"  (1.651 m), weight 60.056 kg (132 lb 6.4 oz), last menstrual period 05/14/2013.  Physical Exam  Nursing note and vitals reviewed. Constitutional: She is oriented to person, place, and time.  She appears well-developed and well-nourished. No distress.  Cardiovascular: Normal rate.   Respiratory: Effort normal.  GI: Soft. There is no tenderness.  Genitourinary:   External: no lesion Vagina: small amount of white discharge Cervix: pink, smooth, no CMT Uterus: NSSC Adnexa: NT   Neurological: She is alert and oriented to person, place, and time.  Skin: Skin is warm and dry.    MAU Course  Procedures  Results for orders placed during the hospital encounter of 06/28/13 (from the past 24 hour(s))  CBC     Status: Abnormal   Collection Time    06/28/13 11:13 PM      Result Value Range   WBC 7.9  4.0 - 10.5 K/uL   RBC 3.98  3.87 - 5.11 MIL/uL   Hemoglobin 11.6 (*) 12.0 - 15.0 g/dL   HCT 24.4 (*) 01.0 - 27.2 %   MCV 83.2  78.0 - 100.0 fL   MCH 29.1  26.0 - 34.0 pg   MCHC 35.0  30.0 - 36.0 g/dL   RDW 53.6  64.4 - 03.4 %   Platelets 226  150 - 400 K/uL  ABO/RH     Status: None   Collection Time    06/28/13 11:13 PM      Result Value  Range   ABO/RH(D) O POS    HCG, QUANTITATIVE, PREGNANCY     Status: Abnormal   Collection Time    06/28/13 11:13 PM      Result Value Range   hCG, Beta Chain, Quant, [place] [address] (*) <5 mIU/mL   US Ob Comp Less 14 Wks  06/29/2013   CLINICAL DATA:  Bleeding  EXAM: OBSTETRIC <14 WK [place] AND TRANSVAGINAL OB US  TECHNIQUE: Both transabdominal and transvaginal ultrasound examinations were performed for complete evaluation of the gestation as well as the maternal uterus, adnexal regions, and pelvic cul-de-sac. Transvaginal technique was performed to assess early pregnancy.  COMPARISON:  06/20/2012  FINDINGS: Intrauterine gestational sac: Visualized/normal in shape.  Yolk sac:  Identified  Embryo:  Identified  Cardiac Activity: Identified  Heart Rate: 129 bpm  CRL:   5.7  mm   6 w 3 d                  [place] EDC: 02/18/2014  Maternal uterus/adnexae: Normal sonographic appearance to the ovaries. Corpus luteal cyst on the right. No free fluid.  IMPRESSION: Single  intrauterine gestation with cardiac activity documented. Estimated age of 6 weeks 3 days by crown-rump length.   Electronically Signed   By: Jearld Lesch M.D.   On: 06/29/2013 00:07   US Ob Transvaginal  06/29/2013   CLINICAL DATA:  Bleeding  EXAM: OBSTETRIC <14 WK [place] AND TRANSVAGINAL OB US  TECHNIQUE: Both transabdominal and transvaginal ultrasound examinations were performed for complete evaluation of the gestation as well as the maternal uterus, adnexal regions, and pelvic cul-de-sac. Transvaginal technique was performed to assess early pregnancy.  COMPARISON:  06/20/2012  FINDINGS: Intrauterine gestational sac: Visualized/normal in shape.  Yolk sac:  Identified  Embryo:  Identified  Cardiac Activity: Identified  Heart Rate: 129 bpm  CRL:   5.7  mm   6 w 3 d                  [place] EDC: 02/18/2014  Maternal uterus/adnexae: Normal sonographic appearance to the ovaries. Corpus luteal cyst on the right. No free fluid.  IMPRESSION: Single intrauterine gestation with cardiac activity documented. Estimated age of 6 weeks 3 days by crown-rump length.   Electronically Signed   By: Jearld Lesch M.D.   On: 06/29/2013 00:07     Assessment and Plan  Nausea/Vomiting pregnancy Pelvic pain first trimester Start [facility] as soon as possible RX: Phenergan and zofran Return to MAU as needed   Tawnya Crook 06/29/2013, 12:54 AM

## 2013-07-15 ENCOUNTER — Encounter: Payer: 59 | Admitting: Obstetrics & Gynecology

## 2013-07-16 ENCOUNTER — Inpatient Hospital Stay (HOSPITAL_COMMUNITY)
Admission: AD | Admit: 2013-07-16 | Discharge: 2013-07-16 | Disposition: A | Discharge status: Home / Self Care | Payer: 59 | Source: Ambulatory Visit | Attending: Obstetrics and Gynecology | Admitting: Obstetrics and Gynecology

## 2013-07-16 ENCOUNTER — Encounter (HOSPITAL_COMMUNITY): Payer: Self-pay

## 2013-07-16 DIAGNOSIS — A499 Bacterial infection, unspecified: Secondary | ICD-10-CM

## 2013-07-16 DIAGNOSIS — N760 Acute vaginitis: Secondary | ICD-10-CM | POA: Insufficient documentation

## 2013-07-16 DIAGNOSIS — O26859 Spotting complicating pregnancy, unspecified trimester: Secondary | ICD-10-CM | POA: Insufficient documentation

## 2013-07-16 DIAGNOSIS — B9689 Other specified bacterial agents as the cause of diseases classified elsewhere: Secondary | ICD-10-CM | POA: Insufficient documentation

## 2013-07-16 DIAGNOSIS — R109 Unspecified abdominal pain: Secondary | ICD-10-CM | POA: Insufficient documentation

## 2013-07-16 DIAGNOSIS — O2390 Unspecified genitourinary tract infection in pregnancy, unspecified trimester: Secondary | ICD-10-CM | POA: Insufficient documentation

## 2013-07-16 DIAGNOSIS — N939 Abnormal uterine and vaginal bleeding, unspecified: Secondary | ICD-10-CM

## 2013-07-16 DIAGNOSIS — O210 Mild hyperemesis gravidarum: Secondary | ICD-10-CM | POA: Insufficient documentation

## 2013-07-16 HISTORY — DX: Acute vaginitis: N76.0

## 2013-07-16 HISTORY — DX: Other specified bacterial agents as the cause of diseases classified elsewhere: B96.89

## 2013-07-16 LAB — WET PREP, GENITAL
Trich, Wet Prep: NONE SEEN
Yeast Wet Prep HPF POC: NONE SEEN

## 2013-07-16 LAB — URINALYSIS, ROUTINE W REFLEX MICROSCOPIC
Bilirubin Urine: NEGATIVE
Leukocytes, UA: NEGATIVE
Nitrite: NEGATIVE
Protein, ur: NEGATIVE mg/dL
Specific Gravity, Urine: 1.02 (ref 1.005–1.030)
Urobilinogen, UA: 0.2 mg/dL (ref 0.0–1.0)
pH: 6.5 (ref 5.0–8.0)

## 2013-07-16 MED ORDER — METRONIDAZOLE 500 MG PO TABS
500.0000 mg | ORAL_TABLET | ORAL | Status: AC
  Administered 2013-07-16: 500 mg via ORAL
  Filled 2013-07-16: qty 1

## 2013-07-16 MED ORDER — METRONIDAZOLE 500 MG PO TABS: 500.0000 mg | ORAL_TABLET | Freq: Two times a day (BID) | ORAL | Status: DC

## 2013-07-16 MED ORDER — PROMETHAZINE HCL 25 MG PO TABS: 12.5000 mg | ORAL_TABLET | Freq: Four times a day (QID) | ORAL | Status: DC | PRN

## 2013-07-16 MED ORDER — PROMETHAZINE HCL 25 MG RE SUPP: 25.0000 mg | Freq: Four times a day (QID) | RECTAL | Status: DC | PRN

## 2013-07-16 MED ORDER — ONDANSETRON HCL 4 MG PO TABS: 4.0000 mg | ORAL_TABLET | Freq: Three times a day (TID) | ORAL | Status: DC | PRN

## 2013-07-16 NOTE — MAU Provider Note (Signed)
Chief Complaint: Emesis During Pregnancy and Abdominal Pain   First Provider Initiated Contact with Patient 07/16/13 [phone]     SUBJECTIVE HPI: Latoya Palmer is a 27 y.o. [id number] at [redacted]w[redacted]d by LMP who presents to maternity admissions reporting n/v of pregnancy, not well managed by Phenergan and Zofran prescribed 12/1, pink spotting last night which has resolved today, and abdominal cramping "like period cramps" for a few weeks.  She reports vaginal discharge unchanged for her and a hx of frequent BV, denies  vaginal itching/burning, urinary symptoms, h/a, dizziness, n/v, or fever/chills.     Past Medical History  Diagnosis Date  . Headache(784.0)   . Heart murmur   . Anemia   . Ovarian cyst   . Abnormal Pap smear   . Preterm labor   . Bacterial vaginosis    Past Surgical History  Procedure Laterality Date  . Myringotomy    . Therapeutic abortion      two  . Colposcopy     History   Social History  . Marital Status: Single    Spouse Name: N/A    Number of Children: N/A  . Years of Education: N/A   Occupational History  . Not on file.   Social History Main Topics  . Smoking status: Former Smoker -- 0.50 packs/day for 4 years    Types: Cigarettes    Quit date: 05/12/2013  . Smokeless tobacco: Never Used  . Alcohol Use: 1.2 oz/week    1 Glasses of wine, 1 Shots of liquor per week     Comment: twice a month, social  . Drug Use: No  . Sexual Activity: Yes    Birth Control/ Protection: None   Other Topics Concern  . Not on file   Social History Narrative  . No narrative on file   No current facility-administered medications on file prior to encounter.   Current Outpatient Prescriptions on File Prior to Encounter  Medication Sig Dispense Refill  . ondansetron (ZOFRAN) 4 MG tablet Take 1 tablet (4 mg total) by mouth every 8 (eight) hours as needed for nausea or vomiting.  20 tablet  0  . promethazine (PHENERGAN) 12.5 MG tablet Take 1 tablet (12.5 mg total) by mouth  every 6 (six) hours as needed for nausea or vomiting.  30 tablet  0   Allergies  Allergen Reactions  . Flagyl [Metronidazole Hcl] Other (See Comments)    Pt states that 24 hours after taking (she is able to take it fine) she develops hives  . Latex Swelling and Other (See Comments)    Causes irritation    ROS: Pertinent items in HPI  OBJECTIVE Blood pressure 108/71, pulse 65, temperature 98.2 F (36.8 C), temperature source Oral, resp. rate 16, height 5\' 5"  (1.651 m), weight 61.961 kg (136 lb 9.6 oz), last menstrual period 05/14/2013, SpO2 100.00%. GENERAL: Well-developed, well-nourished female in no acute distress.  HEENT: Normocephalic HEART: normal rate RESP: normal effort ABDOMEN: Soft, non-tender EXTREMITIES: Nontender, no edema NEURO: Alert and oriented Pelvic exam: Cervix pink, visually closed, without lesion, moderate amount white, thick malodorous discharge, vaginal walls and external genitalia normal Bimanual exam: Cervix 0/long/high, firm, anterior, neg CMT, uterus nontender, nonenlarged, adnexa without tenderness, enlargement, or mass  Bedside sono by Dr Ike Bene with FHR 171, CRL consistent with [redacted]w[redacted]d, EDD of 02/18/14.  LAB RESULTS Results for orders placed during the hospital encounter of 07/16/13 (from the past 24 hour(s))  URINALYSIS, ROUTINE W REFLEX MICROSCOPIC     Status: None  Collection Time    07/16/13  8:34 AM      Result Value Range   Color, Urine YELLOW  YELLOW   APPearance CLEAR  CLEAR   Specific Gravity, Urine 1.020  1.005 - 1.030   pH 6.5  5.0 - 8.0   Glucose, UA NEGATIVE  NEGATIVE mg/dL   Hgb urine dipstick NEGATIVE  NEGATIVE   Bilirubin Urine NEGATIVE  NEGATIVE   Ketones, ur NEGATIVE  NEGATIVE mg/dL   Protein, ur NEGATIVE  NEGATIVE mg/dL   Urobilinogen, UA 0.2  0.0 - 1.0 mg/dL   Nitrite NEGATIVE  NEGATIVE   Leukocytes, UA NEGATIVE  NEGATIVE    ASSESSMENT 1. BV (bacterial vaginosis)   2. Vaginal spotting     PLAN Discharge home Flagyl  first dose given in MAU--no allergic reaction x30 minutes Flagyl 500 mg BID x7 days  Phenergan suppositories as needed Message sent to clinic to start prenatal care Return to MAU as needed    Medication List    ASK your doctor about these medications       ondansetron 4 MG tablet  Commonly known as:  ZOFRAN  Take 1 tablet (4 mg total) by mouth every 8 (eight) hours as needed for nausea or vomiting.     promethazine 12.5 MG tablet  Commonly known as:  PHENERGAN  Take 1 tablet (12.5 mg total) by mouth every 6 (six) hours as needed for nausea or vomiting.         Sharen Counter Certified Nurse-Midwife 07/16/2013  9:02 AM

## 2013-07-16 NOTE — MAU Note (Signed)
Patient states she has had nausea and vomiting for a while. Was seen on 12-1 and given medication that is not working well now. States she had a little spotting last night, none this am. States she started having cramping last night that continues this am.

## 2013-07-16 NOTE — MAU Provider Note (Signed)
Attestation of Attending Supervision of Advanced Practitioner (CNM/NP): Evaluation and management procedures were performed by the Advanced Practitioner under my supervision and collaboration.  I have reviewed the Advanced Practitioner's note and chart, and I agree with the management and plan.  Brylan Seubert 07/16/2013 9:45 AM

## 2013-08-04 ENCOUNTER — Encounter: Payer: Self-pay | Admitting: *Deleted

## 2013-08-11 ENCOUNTER — Encounter: Payer: 59 | Admitting: Advanced Practice Midwife

## 2013-08-17 ENCOUNTER — Inpatient Hospital Stay (HOSPITAL_COMMUNITY)
Admission: AD | Admit: 2013-08-17 | Discharge: 2013-08-17 | Disposition: A | Discharge status: Home / Self Care | Payer: 59 | Source: Ambulatory Visit | Attending: Obstetrics & Gynecology | Admitting: Obstetrics & Gynecology

## 2013-08-17 ENCOUNTER — Encounter (HOSPITAL_COMMUNITY): Payer: Self-pay | Admitting: *Deleted

## 2013-08-17 DIAGNOSIS — R109 Unspecified abdominal pain: Secondary | ICD-10-CM | POA: Insufficient documentation

## 2013-08-17 DIAGNOSIS — O26899 Other specified pregnancy related conditions, unspecified trimester: Secondary | ICD-10-CM

## 2013-08-17 DIAGNOSIS — O99891 Other specified diseases and conditions complicating pregnancy: Secondary | ICD-10-CM | POA: Insufficient documentation

## 2013-08-17 DIAGNOSIS — O210 Mild hyperemesis gravidarum: Secondary | ICD-10-CM | POA: Insufficient documentation

## 2013-08-17 DIAGNOSIS — O9989 Other specified diseases and conditions complicating pregnancy, childbirth and the puerperium: Secondary | ICD-10-CM

## 2013-08-17 HISTORY — DX: Unspecified abnormal cytological findings in specimens from vagina: R87.629

## 2013-08-17 NOTE — MAU Note (Signed)
Patient states she has had nausea through the pregnancy, Phenergan not working any more, but has stopped vomiting. States she has had a vaginal discharge for bout 4 days and started having lower abdominal pain last night.

## 2013-08-17 NOTE — MAU Provider Note (Signed)
History     CSN: [id number]  Arrival date and time: 08/17/13 1339   First Provider Initiated Contact with Patient 08/17/13 1424      Chief Complaint  Patient presents with  . Abdominal Pain  . Vaginal Discharge  . Nausea   HPI  Latoya Palmer is a [age] yof, [id number] at 4833w4d who presents for abdominal cramping that onset yesterday evening.  Pt reports the pain is in the lower aspect of her abdomen, is intermittent, 8/10 and lasts a few minutes when present.  She endorses nausea and yellowish vaginal discharge as associated symptoms.  She did not take any medication by mouth because she was afraid to take the wrong medication while pregnant.   She denies constipation or diarrhea or UTI sx. Pt denies spotting or bleeding OB History   Grav Para Term Preterm Abortions TAB SAB Ect Mult Living   7 1 1  0 5 3 2  0 0 1      Past Medical History  Diagnosis Date  . Headache(784.0)   . Heart murmur   . Anemia   . Ovarian cyst   . Abnormal Pap smear   . Preterm labor   . Bacterial vaginosis   . Vaginal Pap smear, abnormal     Past Surgical History  Procedure Laterality Date  . Myringotomy    . Therapeutic abortion      two  . Colposcopy      History reviewed. No pertinent family history.  History  Substance Use Topics  . Smoking status: Former Smoker -- 0.50 packs/day for 4 years    Types: Cigarettes    Quit date: 05/12/2013  . Smokeless tobacco: Never Used  . Alcohol Use: 1.2 oz/week    1 Glasses of wine, 1 Shots of liquor per week     Comment: twice a month, social    Allergies:  Allergies  Allergen Reactions  . Latex Swelling and Other (See Comments)    Causes irritation    No prescriptions prior to admission    Review of Systems  Constitutional: Negative for fever and chills.  Eyes: Negative for blurred vision and double vision.  Respiratory: Negative for cough and hemoptysis.   Cardiovascular: Negative for chest pain.  Gastrointestinal: Positive for  nausea and abdominal pain. Negative for heartburn, vomiting, diarrhea and constipation.  Neurological: Negative for dizziness and headaches.   Physical Exam   Blood pressure 111/58, pulse 69, temperature 98.9 F (37.2 C), temperature source Oral, resp. rate 16, height 5\' 6"  (1.676 m), weight 61.598 kg (135 lb 12.8 oz), last menstrual period 05/14/2013, SpO2 100.00%.  Physical Exam  Constitutional: She is oriented to person, place, and time. She appears well-developed and well-nourished. No distress.  HENT:  Head: Normocephalic.  Eyes: Pupils are equal, round, and reactive to light.  Neck: Normal range of motion. Neck supple.  Cardiovascular: Normal rate.   Respiratory: Effort normal.  GI: Soft. There is no guarding.  Musculoskeletal: Normal range of motion.  Neurological: She is alert and oriented to person, place, and time.  Skin: Skin is warm and dry.  Psychiatric: She has a normal mood and affect. Her behavior is normal.    MAU Course  Procedures  MDM  Pt has confirmed viable IUP by ultrasound and FHT was 165 bpm with dopper.   Pt had neg GC/chlamdyia in December at last MAU visit but had many clue cells- pt was treated  Patient had to leave before the rest of her exam could take  place.  Stated that her daughter's school called and she needed to pick her up.   Assessment and Plan  abd pain in pregnancy- f/u with OB clinic for scheduled appointment Return sooner if increase in pain or bleeding.  Morning sickness- pt has Rx and diet reviewed with pt  Toilolo, Tifi 08/17/2013, 2:35 PM

## 2013-08-17 NOTE — MAU Provider Note (Signed)
Attestation of Attending Supervision of Advanced Practitioner (CNM/NP): Evaluation and management procedures were performed by the Advanced Practitioner under my supervision and collaboration.  I have reviewed the Advanced Practitioner's note and chart, and I agree with the management and plan.  HARRAWAY-SMITH, Yarel Rushlow 2:57 PM

## 2013-08-17 NOTE — Progress Notes (Signed)
Patient told S. Chase PicketLineberry, NP that she received a call to come and get her child. Patient was discharged by S. Chase PicketLineberry, NP.

## 2013-09-17 ENCOUNTER — Encounter: Payer: Self-pay | Admitting: Family Medicine

## 2013-09-17 ENCOUNTER — Ambulatory Visit (INDEPENDENT_AMBULATORY_CARE_PROVIDER_SITE_OTHER): Payer: 59 | Admitting: Family Medicine

## 2013-09-17 VITALS — BP 122/84 | HR 75 | Ht 65.0 in | Wt 134.3 lb

## 2013-09-17 DIAGNOSIS — O039 Complete or unspecified spontaneous abortion without complication: Secondary | ICD-10-CM

## 2013-09-17 DIAGNOSIS — Z113 Encounter for screening for infections with a predominantly sexual mode of transmission: Secondary | ICD-10-CM

## 2013-09-17 DIAGNOSIS — Z124 Encounter for screening for malignant neoplasm of cervix: Secondary | ICD-10-CM

## 2013-09-17 HISTORY — DX: Complete or unspecified spontaneous abortion without complication: O03.9

## 2013-09-17 MED ORDER — PRENATAL VITAMINS 0.8 MG PO TABS: 1.0000 | ORAL_TABLET | Freq: Every day | ORAL | Status: DC

## 2013-09-17 NOTE — Progress Notes (Signed)
Patient was out of town in [place] and went to hospital with bleeding and cramping. She passed all products of conception naturally. She took cytotec. She is now just having brown spotting. She denies any pain.

## 2013-09-17 NOTE — Patient Instructions (Signed)
Miscarriage A miscarriage is the sudden loss of an unborn baby (fetus) before the 20th week of pregnancy. Most miscarriages happen in the first 3 months of pregnancy. Sometimes, it happens before a woman even knows she is pregnant. A miscarriage is also called a "spontaneous miscarriage" or "early pregnancy loss." Having a miscarriage can be an emotional experience. Talk with your caregiver about any questions you may have about miscarrying, the grieving process, and your future pregnancy plans. CAUSES   Problems with the fetal chromosomes that make it impossible for the baby to develop normally. Problems with the baby's genes or chromosomes are most often the result of errors that occur, by chance, as the embryo divides and grows. The problems are not inherited from the parents.  Infection of the cervix or uterus.   Hormone problems.   Problems with the cervix, such as having an incompetent cervix. This is when the tissue in the cervix is not strong enough to hold the pregnancy.   Problems with the uterus, such as an abnormally shaped uterus, uterine fibroids, or congenital abnormalities.   Certain medical conditions.   Smoking, drinking alcohol, or taking illegal drugs.   Trauma.  Often, the cause of a miscarriage is unknown.  SYMPTOMS   Vaginal bleeding or spotting, with or without cramps or pain.  Pain or cramping in the abdomen or lower back.  Passing fluid, tissue, or blood clots from the vagina. DIAGNOSIS  Your caregiver will perform a physical exam. You may also have an ultrasound to confirm the miscarriage. Blood or urine tests may also be ordered. TREATMENT   Sometimes, treatment is not necessary if you naturally pass all the fetal tissue that was in the uterus. If some of the fetus or placenta remains in the body (incomplete miscarriage), tissue left behind may become infected and must be removed. Usually, a dilation and curettage (D and C) procedure is performed.  During a D and C procedure, the cervix is widened (dilated) and any remaining fetal or placental tissue is gently removed from the uterus.  Antibiotic medicines are prescribed if there is an infection. Other medicines may be given to reduce the size of the uterus (contract) if there is a lot of bleeding.  If you have Rh negative blood and your baby was Rh positive, you will need a Rh immunoglobulin shot. This shot will protect any future baby from having Rh blood problems in future pregnancies. HOME CARE INSTRUCTIONS   Your caregiver may order bed rest or may allow you to continue light activity. Resume activity as directed by your caregiver.  Have someone help with home and family responsibilities during this time.   Keep track of the number of sanitary pads you use each day and how soaked (saturated) they are. Write down this information.   Do not use tampons. Do not douche or have sexual intercourse until approved by your caregiver.   Only take over-the-counter or prescription medicines for pain or discomfort as directed by your caregiver.   Do not take aspirin. Aspirin can cause bleeding.   Keep all follow-up appointments with your caregiver.   If you or your partner have problems with grieving, talk to your caregiver or seek counseling to help cope with the pregnancy loss. Allow enough time to grieve before trying to get pregnant again.  SEEK IMMEDIATE MEDICAL CARE IF:   You have severe cramps or pain in your back or abdomen.  You have a fever.  You pass large blood clots (walnut-sized   or larger) ortissue from your vagina. Save any tissue for your caregiver to inspect.   Your bleeding increases.   You have a thick, bad-smelling vaginal discharge.  You become lightheaded, weak, or you faint.   You have chills.  MAKE SURE YOU:  Understand these instructions.  Will watch your condition.  Will get help right away if you are not doing well or get  worse. Document Released: 01/08/2001 Document Revised: 11/09/2012 Document Reviewed: 09/03/2011 [employer] Patient Information 2014 [employer].  

## 2013-09-17 NOTE — Progress Notes (Signed)
Subjective:     Patient ID: Latoya Palmer, female   DOB: September 21, 1985, 28 y.o.   MRN: [id number]  HPI Latoya Palmer is a 28 y.o. [id number] s/p miscarriage 1/29 in [place]. Pt has minimal spotting brown. Pt reports passing the products of conceptions. No abdominal pain or fevers.   Pt is s/p 2 colpo and a procedure to cervix. Possible cervical incompetence.   Unsure regarding discharge  Review of Systems No f/c, no headaches, no vision changes, no cp, no n/v, no abd pain, no menstrual cycle yet. No depression.    Objective:   Physical Exam Filed Vitals:   09/17/13 0943  BP: 122/84  Pulse: 75   VSS NAD NTTP, ND, No organomegaly No c/c/e SSE: minimal bleeding no ovbious lesions., NTTP     Assessment:     Latoya Palmer is a 28 y.o. [id number] s/p Miscarriage 1/29.  - check BHCG to ensure resolved. - pt due for pap smear - will perform today - desires GC/C screening, HIV, trich - Will give Rx for PNV. Declines contraception at todays visit.   - Pt with possible incompetent cervix given hx of procedure on cervix for abnormal cervical cytology. Recommend return early next pregnancy for evaluation of cervical length and monitoring.   Tawana ScaleMichael Ryan Rosabelle Jupin, MD OB Fellow

## 2013-09-18 LAB — RPR

## 2013-09-18 LAB — HCG, QUANTITATIVE, PREGNANCY: HCG, BETA CHAIN, QUANT, S: 9 m[IU]/mL

## 2013-09-18 LAB — HIV ANTIBODY (ROUTINE TESTING W REFLEX): HIV: NONREACTIVE

## 2013-09-23 ENCOUNTER — Encounter: Payer: Self-pay | Admitting: Family Medicine

## 2013-09-30 ENCOUNTER — Telehealth: Payer: Self-pay

## 2013-09-30 NOTE — Telephone Encounter (Signed)
Called pt. And informed her of abnormal pap and the need for colposcopy. Informed her of date and time of appointment. Pt. Verbalized understanding and had no further questions or concerns.

## 2013-09-30 NOTE — Telephone Encounter (Signed)
Appt. 11/18/13 1:15 with Dr. Ike Benedom.

## 2013-09-30 NOTE — Telephone Encounter (Signed)
Message copied by Louanna RawAMPBELL, [place] Selleck M on Thu Sep 30, 2013 12:10 PM ------      Message from: Odelia GageLINTON, CHERYL A      Created: Wed Sep 29, 2013  3:26 PM       Appointment is 04/23 @1 :15      ----- Message -----         From: Louanna Rawaylor M Savonna Birchmeier, RN         Sent: 09/23/2013  12:02 PM           To: [facility] Admin Pool            Please schedule for colpo and then send back to in basket so that we can call pt. With results and appointment             Thanks so much!       ----- Message -----         From: Minta BalsamMichael R Odom, MD         Sent: 09/23/2013  11:20 AM           To: [facility] Clinical Pool            Abnormal pap smear in need of colpo. Please call to schedule.            Tawana ScaleMichael Ryan Odom, MD      OB Fellow                   ------

## 2013-10-04 ENCOUNTER — Emergency Department (HOSPITAL_COMMUNITY)
Admission: EM | Admit: 2013-10-04 | Discharge: 2013-10-05 | Disposition: A | Discharge status: Home / Self Care | Payer: 59 | Attending: Emergency Medicine | Admitting: Emergency Medicine

## 2013-10-04 ENCOUNTER — Encounter (HOSPITAL_COMMUNITY): Payer: Self-pay | Admitting: Emergency Medicine

## 2013-10-04 DIAGNOSIS — R51 Headache: Secondary | ICD-10-CM

## 2013-10-04 DIAGNOSIS — Y92410 Unspecified street and highway as the place of occurrence of the external cause: Secondary | ICD-10-CM | POA: Insufficient documentation

## 2013-10-04 DIAGNOSIS — S0990XA Unspecified injury of head, initial encounter: Secondary | ICD-10-CM | POA: Insufficient documentation

## 2013-10-04 DIAGNOSIS — R011 Cardiac murmur, unspecified: Secondary | ICD-10-CM | POA: Insufficient documentation

## 2013-10-04 DIAGNOSIS — Z79899 Other long term (current) drug therapy: Secondary | ICD-10-CM | POA: Insufficient documentation

## 2013-10-04 DIAGNOSIS — Z91040 Latex allergy status: Secondary | ICD-10-CM | POA: Insufficient documentation

## 2013-10-04 DIAGNOSIS — Z87891 Personal history of nicotine dependence: Secondary | ICD-10-CM | POA: Insufficient documentation

## 2013-10-04 DIAGNOSIS — Y9389 Activity, other specified: Secondary | ICD-10-CM | POA: Insufficient documentation

## 2013-10-04 DIAGNOSIS — D649 Anemia, unspecified: Secondary | ICD-10-CM | POA: Insufficient documentation

## 2013-10-04 DIAGNOSIS — R519 Headache, unspecified: Secondary | ICD-10-CM

## 2013-10-04 DIAGNOSIS — Z8742 Personal history of other diseases of the female genital tract: Secondary | ICD-10-CM | POA: Insufficient documentation
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
## 2013-10-05 NOTE — Discharge Instructions (Signed)
Recommend 600 mg ibuprofen every 6 hours for pain control. Followup with your primary care provider as needed. Return if symptoms worsen.  Headaches, Frequently Asked Questions MIGRAINE HEADACHES Q: What is migraine? What causes it? How can I treat it? A: Generally, migraine headaches begin as a dull ache. Then they develop into a constant, throbbing, and pulsating pain. You may experience pain at the temples. You may experience pain at the front or back of one or both sides of the head. The pain is usually accompanied by a combination of:  Nausea.  Vomiting.  Sensitivity to light and noise. Some people (about 15%) experience an aura (see below) before an attack. The cause of migraine is believed to be chemical reactions in the brain. Treatment for migraine may include over-the-counter or prescription medications. It may also include self-help techniques. These include relaxation training and biofeedback.  Q: What is an aura? A: About 15% of people with migraine get an "aura". This is a sign of neurological symptoms that occur before a migraine headache. You may see wavy or jagged lines, dots, or flashing lights. You might experience tunnel vision or blind spots in one or both eyes. The aura can include visual or auditory hallucinations (something imagined). It may include disruptions in smell (such as strange odors), taste or touch. Other symptoms include:  Numbness.  A "pins and needles" sensation.  Difficulty in recalling or speaking the correct word. These neurological events may last as long as 60 minutes. These symptoms will fade as the headache begins. Q: What is a trigger? A: Certain physical or environmental factors can lead to or "trigger" a migraine. These include:  Foods.  Hormonal changes.  Weather.  Stress. It is important to remember that triggers are different for everyone. To help prevent migraine attacks, you need to figure out which triggers affect you. Keep a  headache diary. This is a good way to track triggers. The diary will help you talk to your healthcare professional about your condition. Q: Does weather affect migraines? A: Bright sunshine, hot, humid conditions, and drastic changes in barometric pressure may lead to, or "trigger," a migraine attack in some people. But studies have shown that weather does not act as a trigger for everyone with migraines. Q: What is the link between migraine and hormones? A: Hormones start and regulate many of your body's functions. Hormones keep your body in balance within a constantly changing environment. The levels of hormones in your body are unbalanced at times. Examples are during menstruation, pregnancy, or menopause. That can lead to a migraine attack. In fact, about three quarters of all women with migraine report that their attacks are related to the menstrual cycle.  Q: Is there an increased risk of stroke for migraine sufferers? A: The likelihood of a migraine attack causing a stroke is very remote. That is not to say that migraine sufferers cannot have a stroke associated with their migraines. In persons under age 79, the most common associated factor for stroke is migraine headache. But over the course of a person's normal life span, the occurrence of migraine headache may actually be associated with a reduced risk of dying from cerebrovascular disease due to stroke.  Q: What are acute medications for migraine? A: Acute medications are used to treat the pain of the headache after it has started. Examples over-the-counter medications, NSAIDs, ergots, and triptans.  Q: What are the triptans? A: Triptans are the newest class of abortive medications. They are specifically targeted to  treat migraine. Triptans are vasoconstrictors. They moderate some chemical reactions in the brain. The triptans work on receptors in your brain. Triptans help to restore the balance of a neurotransmitter called serotonin.  Fluctuations in levels of serotonin are thought to be a main cause of migraine.  Q: Are over-the-counter medications for migraine effective? A: Over-the-counter, or "OTC," medications may be effective in relieving mild to moderate pain and associated symptoms of migraine. But you should see your caregiver before beginning any treatment regimen for migraine.  Q: What are preventive medications for migraine? A: Preventive medications for migraine are sometimes referred to as "prophylactic" treatments. They are used to reduce the frequency, severity, and length of migraine attacks. Examples of preventive medications include antiepileptic medications, antidepressants, beta-blockers, calcium channel blockers, and NSAIDs (nonsteroidal anti-inflammatory drugs). Q: Why are anticonvulsants used to treat migraine? A: During the past few years, there has been an increased interest in antiepileptic drugs for the prevention of migraine. They are sometimes referred to as "anticonvulsants". Both epilepsy and migraine may be caused by similar reactions in the brain.  Q: Why are antidepressants used to treat migraine? A: Antidepressants are typically used to treat people with depression. They may reduce migraine frequency by regulating chemical levels, such as serotonin, in the brain.  Q: What alternative therapies are used to treat migraine? A: The term "alternative therapies" is often used to describe treatments considered outside the scope of conventional Western medicine. Examples of alternative therapy include acupuncture, acupressure, and yoga. Another common alternative treatment is herbal therapy. Some herbs are believed to relieve headache pain. Always discuss alternative therapies with your caregiver before proceeding. Some herbal products contain arsenic and other toxins. TENSION HEADACHES Q: What is a tension-type headache? What causes it? How can I treat it? A: Tension-type headaches occur randomly. They  are often the result of temporary stress, anxiety, fatigue, or anger. Symptoms include soreness in your temples, a tightening band-like sensation around your head (a "vice-like" ache). Symptoms can also include a pulling feeling, pressure sensations, and contracting head and neck muscles. The headache begins in your forehead, temples, or the back of your head and neck. Treatment for tension-type headache may include over-the-counter or prescription medications. Treatment may also include self-help techniques such as relaxation training and biofeedback. CLUSTER HEADACHES Q: What is a cluster headache? What causes it? How can I treat it? A: Cluster headache gets its name because the attacks come in groups. The pain arrives with little, if any, warning. It is usually on one side of the head. A tearing or bloodshot eye and a runny nose on the same side of the headache may also accompany the pain. Cluster headaches are believed to be caused by chemical reactions in the brain. They have been described as the most severe and intense of any headache type. Treatment for cluster headache includes prescription medication and oxygen. SINUS HEADACHES Q: What is a sinus headache? What causes it? How can I treat it? A: When a cavity in the bones of the face and skull (a sinus) becomes inflamed, the inflammation will cause localized pain. This condition is usually the result of an allergic reaction, a tumor, or an infection. If your headache is caused by a sinus blockage, such as an infection, you will probably have a fever. An x-ray will confirm a sinus blockage. Your caregiver's treatment might include antibiotics for the infection, as well as antihistamines or decongestants.  REBOUND HEADACHES Q: What is a rebound headache? What causes it? How  can I treat it? A: A pattern of taking acute headache medications too often can lead to a condition known as "rebound headache." A pattern of taking too much headache medication  includes taking it more than 2 days per week or in excessive amounts. That means more than the label or a caregiver advises. With rebound headaches, your medications not only stop relieving pain, they actually begin to cause headaches. Doctors treat rebound headache by tapering the medication that is being overused. Sometimes your caregiver will gradually substitute a different type of treatment or medication. Stopping may be a challenge. Regularly overusing a medication increases the potential for serious side effects. Consult a caregiver if you regularly use headache medications more than 2 days per week or more than the label advises. ADDITIONAL QUESTIONS AND ANSWERS Q: What is biofeedback? A: Biofeedback is a self-help treatment. Biofeedback uses special equipment to monitor your body's involuntary physical responses. Biofeedback monitors:  Breathing.  Pulse.  Heart rate.  Temperature.  Muscle tension.  Brain activity. Biofeedback helps you refine and perfect your relaxation exercises. You learn to control the physical responses that are related to stress. Once the technique has been mastered, you do not need the equipment any more. Q: Are headaches hereditary? A: Four out of five (80%) of people that suffer report a family history of migraine. Scientists are not sure if this is genetic or a family predisposition. Despite the uncertainty, a child has a 50% chance of having migraine if one parent suffers. The child has a 75% chance if both parents suffer.  Q: Can children get headaches? A: By the time they reach high school, most young people have experienced some type of headache. Many safe and effective approaches or medications can prevent a headache from occurring or stop it after it has begun.  Q: What type of doctor should I see to diagnose and treat my headache? A: Start with your primary caregiver. Discuss his or her experience and approach to headaches. Discuss methods of  classification, diagnosis, and treatment. Your caregiver may decide to recommend you to a headache specialist, depending upon your symptoms or other physical conditions. Having diabetes, allergies, etc., may require a more comprehensive and inclusive approach to your headache. The National Headache Foundation will provide, upon request, a list of [facility] physician members in your state. Document Released: 10/05/2003 Document Revised: 10/07/2011 Document Reviewed: 03/14/2008 [facility] Patient Information 2014 [place], [place]. Head Injury, Adult You have a head injury. Headaches and throwing up (vomiting) are common after a head injury. It should be easy to wake up from sleeping. Sometimes you must stay in the hospital. Most problems happen within the first 24 hours. Side effects may occur up to 7 10 days after the injury.  WHAT ARE THE TYPES OF HEAD INJURIES? Head injuries can be as minor as a bump. Some head injuries can be more severe. More severe head injuries include:  A jarring injury to the brain (concussion).  A bruise of the brain (contusion). This mean there is bleeding in the brain that can cause swelling.  A cracked skull (skull fracture).  Bleeding in the brain that collects, clots, and forms a bump (hematoma). . WHEN SHOULD I GET HELP RIGHT AWAY?   You are confused or sleepy.  You cannot be woken up.  You feel sick to your stomach (nauseous) or keep throwing up.  Your dizziness or unsteadiness is get worse.  You have very bad, lasting headaches that are not helped by medicine.  You cannot use your arms or legs like normal  You cannot walk.  You notice changes in the black spots in the center of the colored part of your eye (pupil).  You have clear or bloody fluid coming from your nose or ears.  You have trouble seeing. During the next 24 hours after the injury, you must stay with someone who can watch you. This person should get help right away (call 911 in the [place] if  you start to shake and are not able to control it (seizures), you become pass out, or you are unable to wake up. HOW CAN I PREVENT A HEAD INJURY IN THE FUTURE?  Wear seat belts.  Wear helmets while bike riding and playing sports like football.  Stay away from dangerous activities around the house. WHEN CAN I RETURN TO NORMAL ACTIVITIES AND ATHLETICS? See your doctor before doing these activities. You should not do normal activities or play contact sports until 1 week after the following symptoms have stopped:  Headache that does not go away.  Dizziness.  Poor attention.  Confusion.  Memory problems.  Sickness to your stomach or throwing up.  Tiredness.  Fussiness.  Bothered by bright lights or loud noises.  Anxiousness or depression.  Restless sleep. MAKE SURE YOU:   Understand these instructions.  Will watch your condition.  Will get help right away if you are not doing well or get worse. Document Released: 06/27/2008 Document Revised: 05/05/2013 Document Reviewed: 03/22/2013 [facility] Patient Information 2014 [place].

## 2013-10-05 NOTE — ED Provider Notes (Signed)
Medical screening examination/treatment/procedure(s) were performed by non-physician practitioner and as supervising physician I was immediately available for consultation/collaboration.   EKG Interpretation None        Sie Formisano, MD 10/05/13 2321 
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
## 2013-10-19 ENCOUNTER — Telehealth: Payer: Self-pay

## 2013-10-19 ENCOUNTER — Telehealth: Payer: Self-pay | Admitting: *Deleted

## 2013-10-19 DIAGNOSIS — B9689 Other specified bacterial agents as the cause of diseases classified elsewhere: Secondary | ICD-10-CM

## 2013-10-19 DIAGNOSIS — N760 Acute vaginitis: Principal | ICD-10-CM

## 2013-10-19 MED ORDER — METRONIDAZOLE 500 MG PO TABS: 500.0000 mg | ORAL_TABLET | Freq: Two times a day (BID) | ORAL | Status: DC

## 2013-10-19 NOTE — Telephone Encounter (Signed)
Patient called the nurse line and requested medication for bacterial infection.  Called the patient and verified thin discharge with fishy odor.  Pt states she started having discharge after pap smear.  Prescription for flagyl given, pharmacy verified.  Pt has no further questions.

## 2013-10-19 NOTE — Telephone Encounter (Signed)
Called pt and left message to please us a return call back at the office and ask for Latoya Palmer that it is concerning the FMLA papers that she wants us to fill out.

## 2013-10-25 NOTE — Telephone Encounter (Signed)
Called pt and left message stating that it is our second attempt concerning her FMLA papers to give us a call at the clinics.

## 2013-11-02 ENCOUNTER — Other Ambulatory Visit: Payer: Self-pay | Admitting: Family Medicine

## 2013-11-15 ENCOUNTER — Encounter: Payer: Self-pay | Admitting: *Deleted

## 2013-11-18 ENCOUNTER — Telehealth: Payer: Self-pay

## 2013-11-18 ENCOUNTER — Encounter: Payer: 59 | Admitting: Family Medicine

## 2013-11-18 NOTE — Telephone Encounter (Signed)
Pt. Missed today's appointment with Dr. Ike Benedom for Colpo. Called pt. NO answer. Left message stating "We are sorry you missed your appointment today. You appointment is important though it is not urgent. Please call to re-schedule." Will send in basket message to re-schedule appointment for next available and to call pt. With appointment; not an urgent matter. Also sent letter.

## 2013-11-23 ENCOUNTER — Encounter: Payer: Self-pay | Admitting: Medical

## 2013-12-09 ENCOUNTER — Encounter: Payer: 59 | Admitting: Medical

## 2013-12-09 ENCOUNTER — Telehealth: Payer: Self-pay

## 2013-12-09 NOTE — Telephone Encounter (Signed)
Pt. Missed today's appointment for colpo. Attempted to call pt. Latoya PatchHeard "the person you are trying to reach is unavailable at this time. Please try your call again later." Unable to leave message. Called emergency contact number and reached a woman who stated she has no relation to this patient, she has had the phone number for the last 3 years and receives multiple calls asking to speak to Latoya Palmer; apologized and informed her we would no longer call number. Will send letter for patient to reschedule colpo. Non-urgent though it does need to be performed.

## 2013-12-24 ENCOUNTER — Encounter: Payer: Self-pay | Admitting: General Practice

## 2014-01-11 ENCOUNTER — Telehealth: Payer: Self-pay | Admitting: *Deleted

## 2014-01-11 NOTE — Telephone Encounter (Signed)
Called patient, no answer- left message that we are trying to reach you to return your phone call, please call us back at the clinics 

## 2014-01-11 NOTE — Telephone Encounter (Signed)
Patient called and stated that she would like us to call in plan b for her. She knows that its over the counter but her insurance will cover it if we give her an rx.   (Also noticed that patient needs to have a colpo, she no showed for her last appt and never rescheduled or replied to our letter)

## 2014-01-12 NOTE — Telephone Encounter (Signed)
Called patient, no answer- left message that we are trying to return your phone call, please call us back at the clinics 

## 2014-01-13 NOTE — Telephone Encounter (Signed)
Called patient no answer. No further attempts will be made to contact patient. Will readdress issue if she calls back.

## 2014-01-17 ENCOUNTER — Telehealth: Payer: Self-pay | Admitting: *Deleted

## 2014-01-17 MED ORDER — LEVONORGESTREL 0.75 MG PO TABS: 0.7500 mg | ORAL_TABLET | Freq: Two times a day (BID) | ORAL | Status: DC

## 2014-01-17 NOTE — Telephone Encounter (Signed)
Per Dr. Reola CalkinsBeck okay to call in plan b. Rx e prescribed. Called patient and line is disconnected.

## 2014-01-17 NOTE — Telephone Encounter (Signed)
Patient called and stated that she would like PLAN B called to her pharmacy because her insurance pays for it.

## 2014-02-18 ENCOUNTER — Encounter: Payer: Self-pay | Admitting: General Practice

## 2014-03-01 ENCOUNTER — Telehealth: Payer: Self-pay

## 2014-03-01 DIAGNOSIS — Z7251 High risk heterosexual behavior: Secondary | ICD-10-CM

## 2014-03-01 MED ORDER — LEVONORGESTREL 0.75 MG PO TABS: 0.7500 mg | ORAL_TABLET | Freq: Two times a day (BID) | ORAL | Status: DC

## 2014-03-01 NOTE — Telephone Encounter (Signed)
Dr. Jolayne Pantheronstant agreed to order plan B. Medication e-prescribed. Called patient. No answer. Left message stating the medication you requested has been sent to your pharmacy.
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
## 2014-03-11 IMAGING — US US OB COMP LESS 14 WK
1 series · 13 of 28 positions shown · non-contrast
Comparison: Pelvic ultrasound 09/23/2007.

CLINICAL DATA: 26-year-old female with vaginal bleeding. LMP
unclear, recent bleeding. Quantitative beta HCG 13 29.

OBSTETRIC <14 WK US AND TRANSVAGINAL OB US
TECHNIQUE: Both transabdominal and transvaginal ultrasound
examinations were performed for complete evaluation of the
gestation as well as the maternal uterus, adnexal regions, and
pelvic cul-de-sac.  Transvaginal technique was performed to assess
early pregnancy.

[Series 1: us ob comp less 14 wk · 0.28mm/px · 13 of 68 slices shown]
[im 3/68]
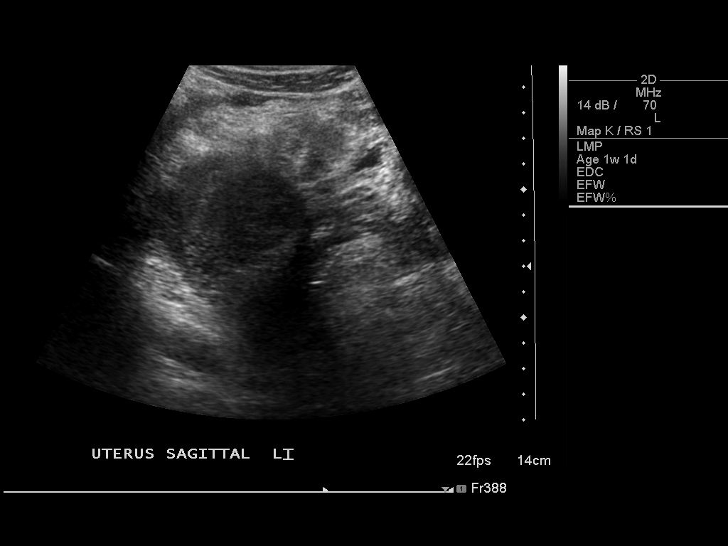
[im 8/68]
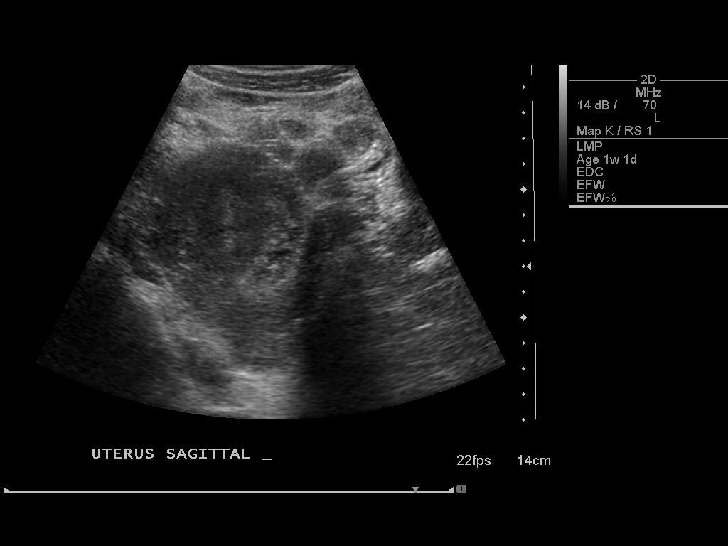
[im 13/68]
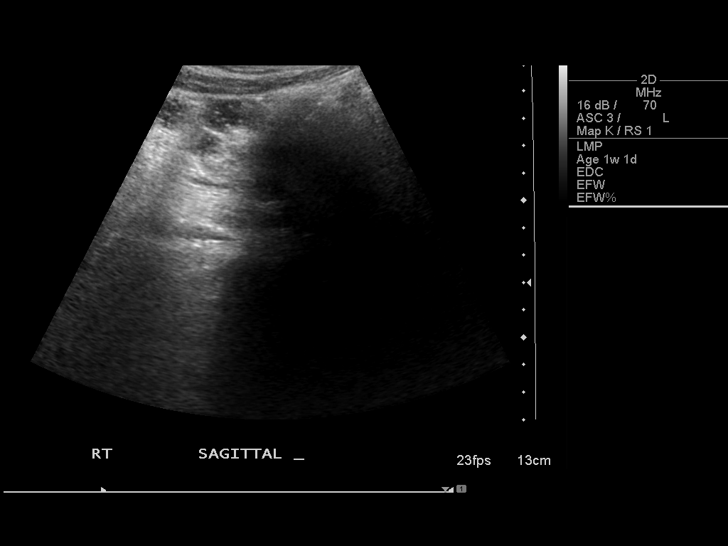
[im 18/68]
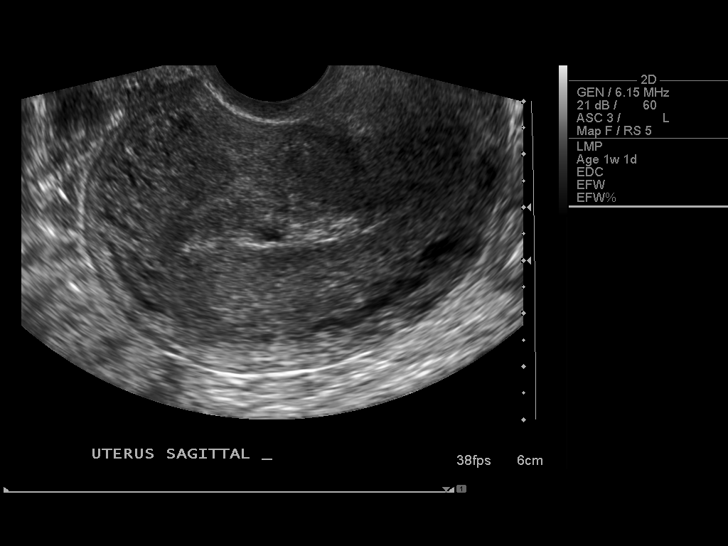
[im 23/68]
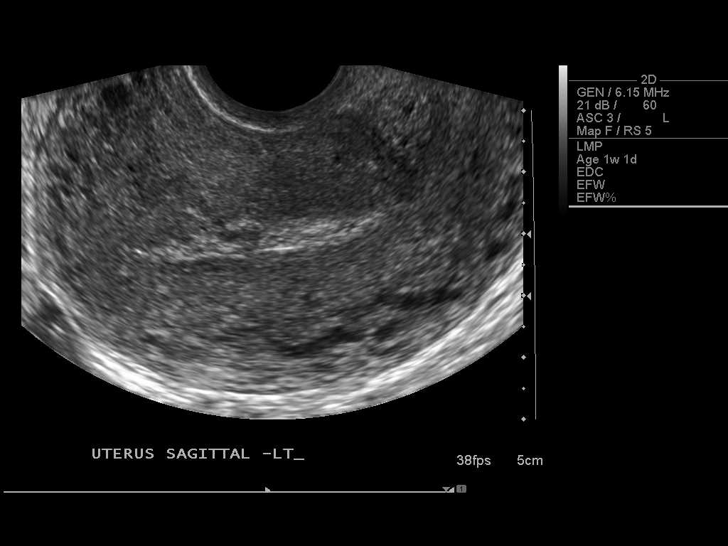
[im 28/68]
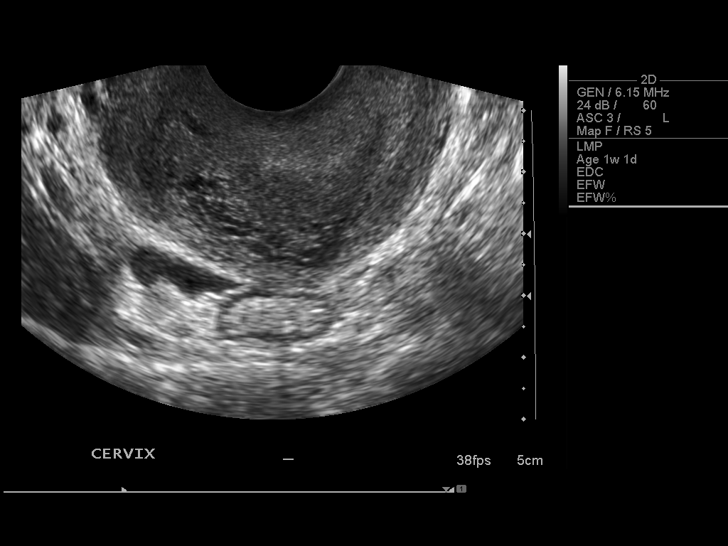
[im 35/68]
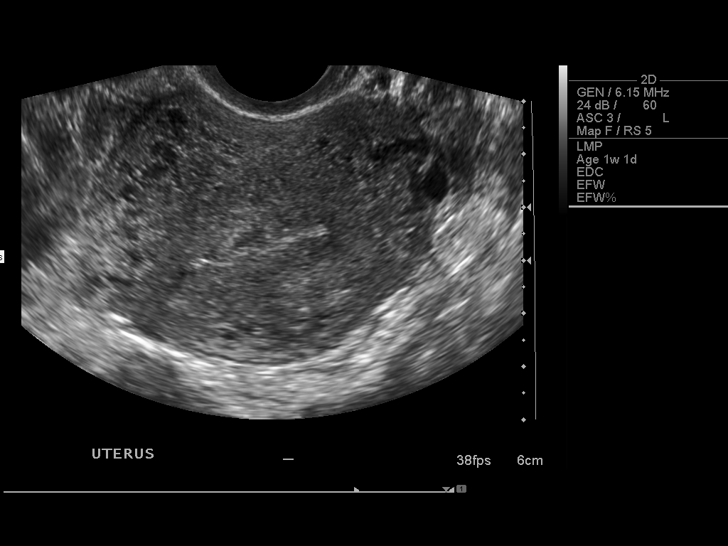
[im 40/68]
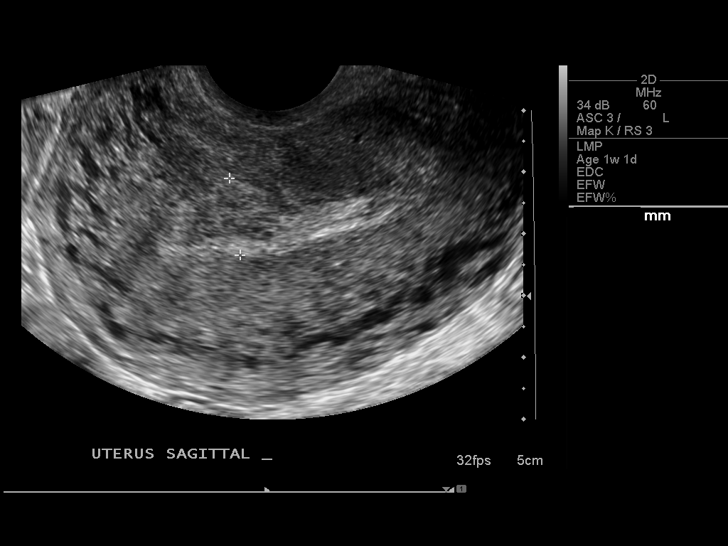
[im 45/68]
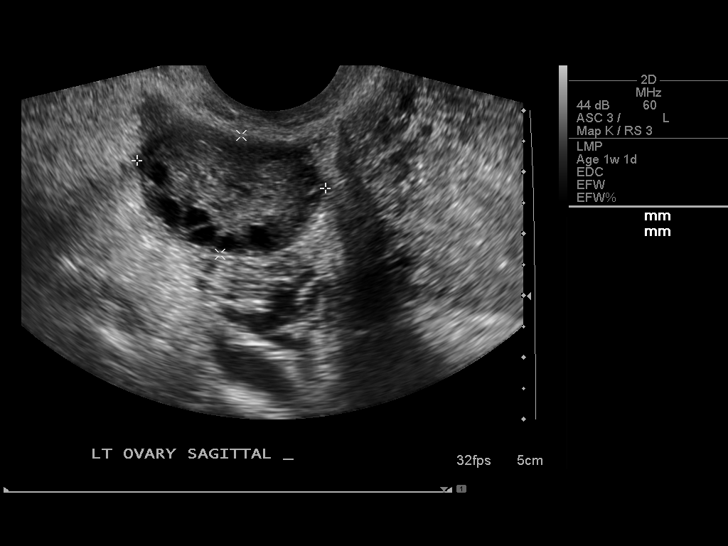
[im 50/68]
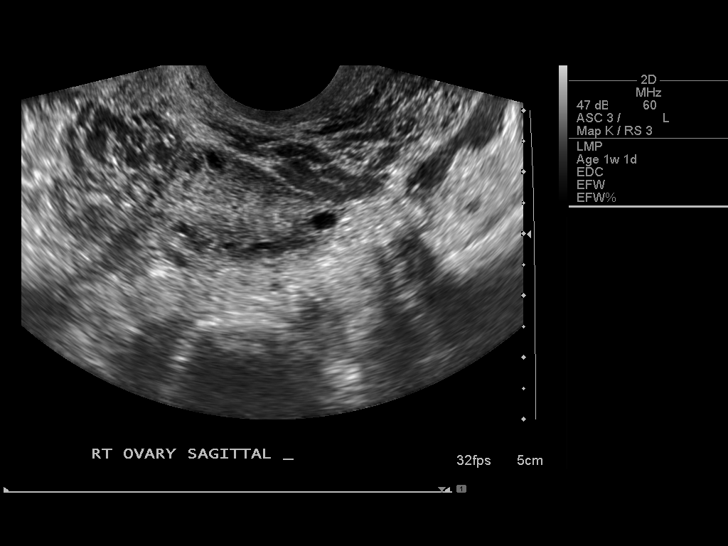
[im 55/68]
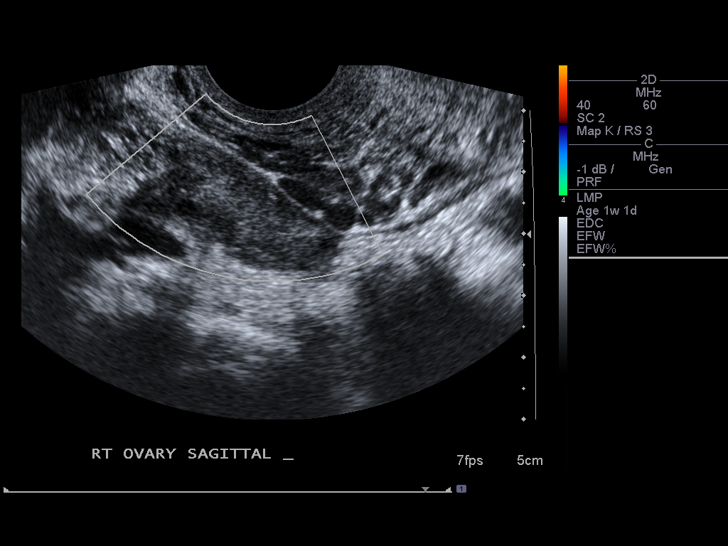
[im 60/68]
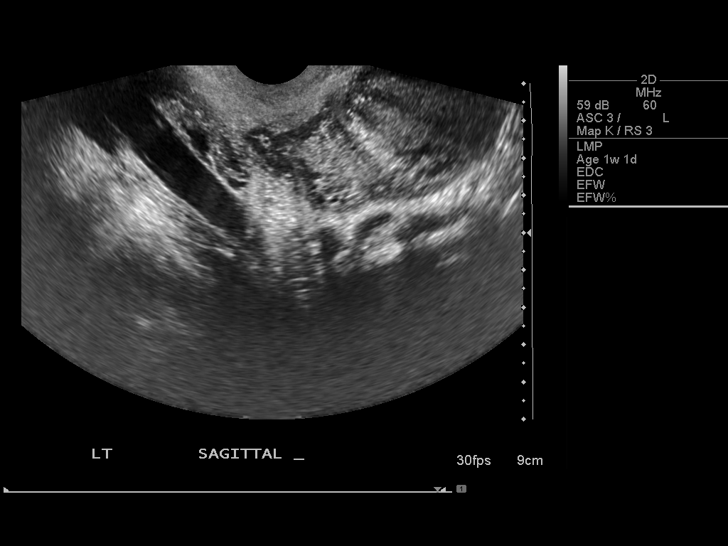
[im 65/68]
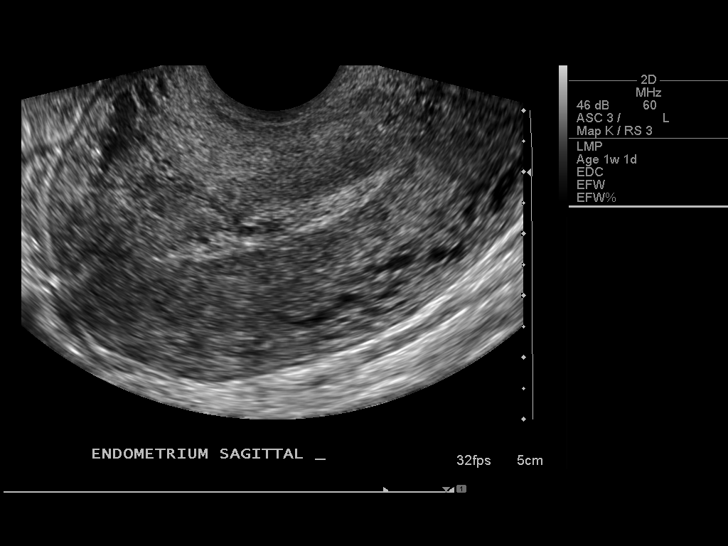

[13 of 28 positions shown; findings below may reference images not displayed]

Intrauterine gestational sac:  No normal
Yolk sac: None
Embryo: None
Cardiac Activity: None

Maternal uterus/adnexae:
Heterogeneous endometrium at the level of the fundus.  Slightly
complex area (image 38 and 47). Real time images suggest the
possibility of some cardiac activity, but no normal gestational sac
or other structures are identified.
Trace pelvic free fluid.
Both ovaries are normal with multiple small follicles.  The left
measures 3.1 x 2.0 x 2.8 cm.  The right measures 3.1 x 2.0 x
cm.
IMPRESSION: No normal IUP, and no adnexal mass.  Trace free fluid.  Small
complex area identified in the endometrium near the fundus.
Constellation of findings favors a spontaneous abortion in
progress.
Recommend correlation with serial quantitative BHCG and followup
imaging as indicated.  Study discussed with Dr. Almontas Umaraite at
the time of dictation.

## 2014-04-02 ENCOUNTER — Encounter (HOSPITAL_COMMUNITY): Payer: Self-pay | Admitting: Emergency Medicine

## 2014-04-02 ENCOUNTER — Emergency Department (HOSPITAL_COMMUNITY)
Admission: EM | Admit: 2014-04-02 | Discharge: 2014-04-02 | Disposition: A | Discharge status: Home / Self Care | Payer: 59 | Attending: Emergency Medicine | Admitting: Emergency Medicine

## 2014-04-02 DIAGNOSIS — Z87891 Personal history of nicotine dependence: Secondary | ICD-10-CM | POA: Diagnosis not present

## 2014-04-02 DIAGNOSIS — L259 Unspecified contact dermatitis, unspecified cause: Secondary | ICD-10-CM | POA: Diagnosis not present

## 2014-04-02 DIAGNOSIS — Z8619 Personal history of other infectious and parasitic diseases: Secondary | ICD-10-CM | POA: Insufficient documentation

## 2014-04-02 DIAGNOSIS — Z79899 Other long term (current) drug therapy: Secondary | ICD-10-CM | POA: Insufficient documentation

## 2014-04-02 DIAGNOSIS — R011 Cardiac murmur, unspecified: Secondary | ICD-10-CM | POA: Diagnosis not present

## 2014-04-02 DIAGNOSIS — D649 Anemia, unspecified: Secondary | ICD-10-CM | POA: Insufficient documentation

## 2014-04-02 DIAGNOSIS — R21 Rash and other nonspecific skin eruption: Secondary | ICD-10-CM | POA: Insufficient documentation

## 2014-04-02 DIAGNOSIS — R51 Headache: Secondary | ICD-10-CM | POA: Diagnosis not present

## 2014-04-02 DIAGNOSIS — Z8742 Personal history of other diseases of the female genital tract: Secondary | ICD-10-CM | POA: Insufficient documentation

## 2014-04-02 DIAGNOSIS — Z91040 Latex allergy status: Secondary | ICD-10-CM | POA: Insufficient documentation

## 2014-04-02 MED ORDER — DIPHENHYDRAMINE HCL 25 MG PO CAPS: 25.0000 mg | ORAL_CAPSULE | Freq: Four times a day (QID) | ORAL | Status: DC | PRN

## 2014-04-02 MED ORDER — HYDROCORTISONE 2.5 % EX LOTN: TOPICAL_LOTION | Freq: Two times a day (BID) | CUTANEOUS | Status: DC

## 2014-04-02 NOTE — ED Notes (Signed)
Raised red rash present on right arm, abdomen and frontal neck. Pt had been cutting brush at home, unsure of what plants contacted with. Also moved into a new residence within the past week. NO airway/respiratory issues

## 2014-04-02 NOTE — Discharge Instructions (Signed)
Contact Dermatitis °Contact dermatitis is a rash that happens when something touches the skin. You touched something that irritates your skin, or you have allergies to something you touched. °HOME CARE  °· Avoid the thing that caused your rash. °· Keep your rash away from hot water, soap, sunlight, chemicals, and other things that might bother it. °· Do not scratch your rash. °· You can take cool baths to help stop itching. °· Only take medicine as told by your doctor. °· Keep all doctor visits as told. °GET HELP RIGHT AWAY IF:  °· Your rash is not better after 3 days. °· Your rash gets worse. °· Your rash is puffy (swollen), tender, red, sore, or warm. °· You have problems with your medicine. °MAKE SURE YOU:  °· Understand these instructions. °· Will watch your condition. °· Will get help right away if you are not doing well or get worse. °Document Released: 05/12/2009 Document Revised: 10/07/2011 Document Reviewed: 12/18/2010 °[employer]® Patient Information ©2015 [employer]. This information is not intended to replace advice given to you by your health care provider. Make sure you discuss any questions you have with your health care provider. ° °

## 2014-04-03 NOTE — ED Provider Notes (Signed)
CSN: [id number]     Arrival date & time 04/02/14  1444 History   First MD Initiated Contact with Patient 04/02/14 1456     Chief Complaint  Patient presents with  . Rash     (Consider location/radiation/quality/duration/timing/severity/associated sxs/prior Treatment) Patient is a 28 y.o. female presenting with rash. The history is provided by the patient.  Rash Location: arm, abdomen neck. Quality: itchiness and redness   Severity:  Moderate Onset quality:  Gradual Duration:  1 week Timing:  Constant Progression:  Spreading Chronicity:  New Context comment:  After working in the yard Relieved by: calamine lotion. Worsened by:  Nothing tried Ineffective treatments:  None tried Associated symptoms: no abdominal pain, no diarrhea, no fever, no hoarse voice, no induration, no joint pain, no periorbital edema, no shortness of breath, no sore throat, no throat swelling, not vomiting and not wheezing     Past Medical History  Diagnosis Date  . Headache(784.0)   . Heart murmur   . Anemia   . Ovarian cyst   . Abnormal Pap smear   . Preterm labor   . Bacterial vaginosis   . Vaginal Pap smear, abnormal    Past Surgical History  Procedure Laterality Date  . Myringotomy    . Therapeutic abortion      two  . Colposcopy     History reviewed. No pertinent family history. History  Substance Use Topics  . Smoking status: Former Smoker -- 0.50 packs/day for 4 years    Types: Cigarettes    Quit date: 05/12/2013  . Smokeless tobacco: Never Used  . Alcohol Use: 1.2 oz/week    1 Glasses of wine, 1 Shots of liquor per week     Comment: twice a month, social   OB History   Grav Para Term Preterm Abortions TAB SAB Ect Mult Living   0 0 0 1     Review of Systems  Constitutional: Negative for fever.  HENT: Negative for hoarse voice and sore throat.   Respiratory: Negative for shortness of breath and wheezing.   Gastrointestinal: Negative for vomiting, abdominal pain  and diarrhea.  Musculoskeletal: Negative for arthralgias.  Skin: Positive for rash.  All other systems reviewed and are negative.     Allergies  Latex  Home Medications   Prior to Admission medications   Medication Sig Start Date End Date Taking? Authorizing Provider  diphenhydrAMINE (BENADRYL) 25 mg capsule Take 1 capsule (25 mg total) by mouth every 6 (six) hours as needed for itching. 04/02/14   Arley Phenix, MD  hydrocortisone 2.5 % lotion Apply topically 2 (two) times daily. X 5 days qs 04/02/14   Arley Phenix, MD  ibuprofen (ADVIL,MOTRIN) 200 MG tablet Take 400 mg by mouth every 8 (eight) hours as needed for mild pain or moderate pain.    Historical Provider, MD  levonorgestrel (PLAN B) 0.75 MG tablet Take 1 tablet (0.75 mg total) by mouth every 12 (twelve) hours. 03/01/14   Peggy Constant, MD  metroNIDAZOLE (FLAGYL) 500 MG tablet TAKE 1 TABLET BY MOUTH TWICE DAILY    Reva Bores, MD  Prenatal Multivit-Min-Fe-FA (PRENATAL VITAMINS) 0.8 MG tablet Take 1 tablet by mouth daily. 09/17/13   Minta Balsam, MD   BP 118/81  Pulse 73  Temp(Src) 98.4 F (36.9 C) (Oral)  Resp 18  SpO2 100% Physical Exam  Nursing note and vitals reviewed. Constitutional: She is oriented to person, place, and time. She appears  well-developed and well-nourished.  HENT:  Head: Normocephalic.  Right Ear: External ear normal.  Left Ear: External ear normal.  Nose: Nose normal.  Mouth/Throat: Oropharynx is clear and moist.  Eyes: EOM are normal. Pupils are equal, round, and reactive to light. Right eye exhibits no discharge. Left eye exhibits no discharge.  Neck: Normal range of motion. Neck supple. No tracheal deviation present.  No nuchal rigidity no meningeal signs  Cardiovascular: Normal rate and regular rhythm.   Pulmonary/Chest: Effort normal and breath sounds normal. No stridor. No respiratory distress. She has no wheezes. She has no rales.  Abdominal: Soft. She exhibits no distension and no  mass. There is no tenderness. There is no rebound and no guarding.  Musculoskeletal: Normal range of motion. She exhibits no edema and no tenderness.  Neurological: She is alert and oriented to person, place, and time. She has normal reflexes. No cranial nerve deficit. Coordination normal.  Skin: Skin is warm. Rash noted. She is not diaphoretic. No erythema. No pallor.  No pettechia no purpura erythematous papules down side of left neck and on right arm no induration no fluctuance no tenderness no blistering no warmth,     ED Course  Procedures (including critical care time) Labs Review Labs Reviewed - No data to display  Imaging Review No results found.   EKG Interpretation None      MDM   Final diagnoses:  Contact dermatitis    I have reviewed the patient's past medical records and nursing notes and used this information in my decision-making process.  Patient most likely with contact dermatitis. No facial involvement. No evidence of anaphylaxis. No history of fever to suggest infectious process. Will start patient on Benadryl hydrocortisone cream and have followup with PCP if not improving. Patient agrees with plan.    Arley Phenix, MD 04/03/14 [phone]

## 2014-04-11 ENCOUNTER — Telehealth: Payer: Self-pay | Admitting: *Deleted

## 2014-04-11 NOTE — Telephone Encounter (Signed)
Called pt and left message that we are returning your call to please call the clinics.

## 2014-04-11 NOTE — Telephone Encounter (Signed)
Pt left message requesting refill of Chantix. She stated that her other doctor did not re-order it and would like [place] to refill for her.  **Note: per chart review, pt [facility] in April or May for Colpo - needs rescheduled.

## 2014-04-12 NOTE — Telephone Encounter (Signed)
Patient called back to front office and I discussed with patient that I see she has missed several appointments with [place] for her colposcopy. Patient states her job has been busy and she couldn't get off work to come in for the appt but would like the appointment rescheduled. Told patient I will let our front office know and they will contact her with a new appt in our office. Patient verbalized understanding. Told patient that we will not be refilling her chantix at this time and that she needs to see a primary care doctor to get this prescription so they can monitor her. Patient states she is in between offices right now and works for [employer] and they are in open enrollment so she cannot take time off to come in and be seen. Patient states we have been giving it to her so she doesn't see what the problem is. Told patient we have not given her a prescription since 2013. Patient states that it was a year supply and now she is out. Told patient that, that prescription then would've run out in 2014 and that she needs to get the refill from the provider she has been seeing for this. Patient started to yell stating that smoking is going to be what kills her not cervical cancer and we should be more concerned about helping her quit smoking but that's fine she will just go to the ER where she cannot pay a bill and they will give her the medication but that's fine and hung up the phone.

## 2014-04-12 NOTE — Telephone Encounter (Signed)
Called patient, no answer- left message that we are trying to return your phone call, please call us back at the clinics 

## 2014-04-18 ENCOUNTER — Telehealth: Payer: Self-pay | Admitting: General Practice

## 2014-04-18 NOTE — Telephone Encounter (Signed)
Patient called and left message stating she needs a refill on her flagyl for BV and she has taken it before but has a hard time taking pills and sometimes vomits after taking them

## 2014-04-19 NOTE — Telephone Encounter (Signed)
Called Latoya Palmer and left a message we got her message and are calling her back, requested she call clinic as we need to discuss with her what she called about. From chart review not sure when she got flagyl last, need to discuss her symtoms and if she has insurance if she wants to try metrogel or take flagyl with foods, etc.

## 2014-04-20 NOTE — Telephone Encounter (Signed)
Called patient, no answer- left message stating we are trying to return your phone call, please call us back if you still need assistance.

## 2014-04-26 ENCOUNTER — Emergency Department (HOSPITAL_COMMUNITY)
Admission: EM | Admit: 2014-04-26 | Discharge: 2014-04-26 | Disposition: A | Discharge status: Home / Self Care | Payer: 59 | Attending: Emergency Medicine | Admitting: Emergency Medicine

## 2014-04-26 ENCOUNTER — Telehealth: Payer: Self-pay

## 2014-04-26 ENCOUNTER — Encounter (HOSPITAL_COMMUNITY): Payer: Self-pay | Admitting: Emergency Medicine

## 2014-04-26 DIAGNOSIS — Z8742 Personal history of other diseases of the female genital tract: Secondary | ICD-10-CM | POA: Diagnosis not present

## 2014-04-26 DIAGNOSIS — Z79899 Other long term (current) drug therapy: Secondary | ICD-10-CM | POA: Insufficient documentation

## 2014-04-26 DIAGNOSIS — Z87891 Personal history of nicotine dependence: Secondary | ICD-10-CM | POA: Insufficient documentation

## 2014-04-26 DIAGNOSIS — Z87448 Personal history of other diseases of urinary system: Secondary | ICD-10-CM | POA: Insufficient documentation

## 2014-04-26 DIAGNOSIS — S199XXA Unspecified injury of neck, initial encounter: Secondary | ICD-10-CM

## 2014-04-26 DIAGNOSIS — N760 Acute vaginitis: Principal | ICD-10-CM

## 2014-04-26 DIAGNOSIS — IMO0002 Reserved for concepts with insufficient information to code with codable children: Secondary | ICD-10-CM | POA: Diagnosis not present

## 2014-04-26 DIAGNOSIS — Z862 Personal history of diseases of the blood and blood-forming organs and certain disorders involving the immune mechanism: Secondary | ICD-10-CM | POA: Insufficient documentation

## 2014-04-26 DIAGNOSIS — S139XXA Sprain of joints and ligaments of unspecified parts of neck, initial encounter: Secondary | ICD-10-CM | POA: Diagnosis not present

## 2014-04-26 DIAGNOSIS — Y9389 Activity, other specified: Secondary | ICD-10-CM | POA: Diagnosis not present

## 2014-04-26 DIAGNOSIS — Z91040 Latex allergy status: Secondary | ICD-10-CM | POA: Insufficient documentation

## 2014-04-26 DIAGNOSIS — R011 Cardiac murmur, unspecified: Secondary | ICD-10-CM | POA: Diagnosis not present

## 2014-04-26 DIAGNOSIS — S0993XA Unspecified injury of face, initial encounter: Secondary | ICD-10-CM | POA: Diagnosis present

## 2014-04-26 DIAGNOSIS — S161XXA Strain of muscle, fascia and tendon at neck level, initial encounter: Secondary | ICD-10-CM

## 2014-04-26 DIAGNOSIS — B9689 Other specified bacterial agents as the cause of diseases classified elsewhere: Secondary | ICD-10-CM

## 2014-04-26 DIAGNOSIS — Y92410 Unspecified street and highway as the place of occurrence of the external cause: Secondary | ICD-10-CM | POA: Insufficient documentation

## 2014-04-26 MED ORDER — TRAMADOL HCL 50 MG PO TABS: 50.0000 mg | ORAL_TABLET | Freq: Four times a day (QID) | ORAL | Status: DC | PRN

## 2014-04-26 MED ORDER — METRONIDAZOLE 500 MG PO TABS: 500.0000 mg | ORAL_TABLET | Freq: Two times a day (BID) | ORAL | Status: DC

## 2014-04-26 MED ORDER — MELOXICAM 15 MG PO TABS: 15.0000 mg | ORAL_TABLET | Freq: Every day | ORAL | Status: DC
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
## 2014-04-26 NOTE — Telephone Encounter (Addendum)
Patient walked into clinic stating she has BV and needs refill of metronidazole. States she was last prescribed it with refills in the MAU and was told not to incure MAU visit charge next time she needs medication as she knows she already has it. C/o of thin discharge with odor and vaginal itching. Flagyl e-prescribed per protocol. Advised patient she will need visit with provider if infection occurs again prior to February (when she is due for annual) as recurrent BV is abnormal. Patient verbalized understanding.

## 2014-04-27 NOTE — ED Provider Notes (Signed)
Medical screening examination/treatment/procedure(s) were performed by non-physician practitioner and as supervising physician I was immediately available for consultation/collaboration.   EKG Interpretation None       Juliet RudeNathan R. Rubin PayorPickering, MD 04/27/14 [phone]

## 2014-05-09 ENCOUNTER — Telehealth: Payer: Self-pay | Admitting: *Deleted

## 2014-05-09 DIAGNOSIS — Z7251 High risk heterosexual behavior: Secondary | ICD-10-CM

## 2014-05-09 MED ORDER — ULIPRISTAL ACETATE 30 MG PO TABS: 1.0000 | ORAL_TABLET | Freq: Once | ORAL | Status: DC

## 2014-05-09 MED ORDER — LEVONORGESTREL 0.75 MG PO TABS: 0.7500 mg | ORAL_TABLET | Freq: Two times a day (BID) | ORAL | Status: DC

## 2014-05-09 NOTE — Telephone Encounter (Signed)
[facility] and left a message they could not fill her [name] prescription because no [facility] in the area has any. Want to get an order for something that is available like Plan B.
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
## 2014-05-09 NOTE — Telephone Encounter (Signed)
Discussed with Dr.Anyanwu [facility] . Called Morrie Sheldonshley and informed her prescription sent to her pharmacy and she needs to pick it up and take it today. Morrie Sheldonshley voices understanding.
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
## 2014-05-09 NOTE — Telephone Encounter (Signed)
Called [facility] back and gave the order to cancel [name] since they could not fill it and gave phone order may have Plan B.We discussed patient is just at or just over 72 hours since intercourse and plan B is safe to take, but may not prevent pregnancy. Called Latoya Palmer and informed her may take plan B as soon as possible and may not prevent pregnancy because she is around or just over 72 hour window.  Latoya Palmer voices understanding.

## 2014-05-24 ENCOUNTER — Telehealth: Payer: Self-pay | Admitting: *Deleted
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
## 2014-05-30 ENCOUNTER — Encounter (HOSPITAL_COMMUNITY): Payer: Self-pay | Admitting: Emergency Medicine

## 2014-07-14 ENCOUNTER — Telehealth: Payer: Self-pay | Admitting: *Deleted

## 2014-07-14 DIAGNOSIS — B9689 Other specified bacterial agents as the cause of diseases classified elsewhere: Secondary | ICD-10-CM

## 2014-07-14 DIAGNOSIS — N760 Acute vaginitis: Principal | ICD-10-CM

## 2014-07-14 MED ORDER — METRONIDAZOLE 500 MG PO TABS: 500.0000 mg | ORAL_TABLET | Freq: Two times a day (BID) | ORAL | Status: DC

## 2014-07-14 NOTE — Telephone Encounter (Signed)
Pt called the clinic requesting a prescription for Flagyl for recurrent bv.  Appointment made for patient on 07/15/14 @ 0830 with Debroah LoopArnold.  Attempted to call patient, no answer, left message with appointment date/time and to please call the clinic.

## 2014-07-14 NOTE — Telephone Encounter (Signed)
Pt cannot make appointment in the am.  Informed her we would provider her with one dose of Flagyl but she needed to call and schedule an appointment to be seen for this issue.  Pt verbalizes understanding.

## 2014-07-15 ENCOUNTER — Telehealth: Payer: Self-pay | Admitting: *Deleted

## 2014-07-15 ENCOUNTER — Ambulatory Visit: Payer: 59 | Admitting: Obstetrics & Gynecology

## 2014-07-15 NOTE — Telephone Encounter (Signed)
Latoya Palmer missed a scheduled appointment today. Called and she states she had told someone she couldn't come today. Does want to reschedule it - on January 22 if possible because she is off then. Informed her I would forward to registars and they will call her back next week.

## 2014-07-17 ENCOUNTER — Telehealth: Payer: Self-pay | Admitting: Physician Assistant

## 2014-07-17 DIAGNOSIS — Z7251 High risk heterosexual behavior: Secondary | ICD-10-CM

## 2014-07-17 MED ORDER — LEVONORGESTREL 0.75 MG PO TABS
0.7500 mg | ORAL_TABLET | Freq: Two times a day (BID) | ORAL | Status: DC
Start: 2014-07-17 — End: 2014-08-10

## 2014-07-17 NOTE — Telephone Encounter (Signed)
Pt presents to MAU demanding Plan B prescription as she does not have money to pay out of pocket for OTC plan B.  She has gotten this rx multiple times in the past.  She is a clinic patient here and cannot get to talk to a nurse over the weekend.  Monday will be too late as it is time sensitive.  Rx will be sent to pharmacy.  Pt advised to come in to clinic for appt asap and request multiple refills of Plan B if this is going to be an issue in the future.

## 2014-07-18 ENCOUNTER — Encounter: Payer: Self-pay | Admitting: Obstetrics & Gynecology

## 2014-08-10 ENCOUNTER — Telehealth: Payer: Self-pay | Admitting: General Practice

## 2014-08-10 DIAGNOSIS — Z7251 High risk heterosexual behavior: Secondary | ICD-10-CM

## 2014-08-10 MED ORDER — LEVONORGESTREL 0.75 MG PO TABS: 0.7500 mg | ORAL_TABLET | Freq: Two times a day (BID) | ORAL | Status: DC

## 2014-08-10 NOTE — Telephone Encounter (Signed)
Patient called and left message stating she needs a prescription for plan B so her insurance will cover it and is using the [facility] [address]. Called patient back and told her we would send the Rx to her pharmacy and suggested patient come in to discuss birth control since she has requested this prescription several times. Patient states she already has an appt with us on 2/10. Suggested patient consider long term birth control options like mirena or nexplanon. Patient states she uses condoms because she takes flagyl each month due to BV and the flagyl cancels out any birth control. Told patient that antibiotics may weaken birth control but it's better than not using any. Also suggested to patient that if she uses metrogel, it doesn't weaken the birth control like the pills. Patient verbalized understanding. Also recommended to patient she try taking a probiotic twice a day with at least 21 billion cultures to help establish good bacteria. Patient verbalized understanding. Informed patient of med being sent to pharmacy. Patient verbalized understanding and had no other questions

## 2014-09-07 ENCOUNTER — Encounter: Payer: 59 | Admitting: Family Medicine

## 2014-10-13 ENCOUNTER — Telehealth: Payer: Self-pay

## 2014-10-13 DIAGNOSIS — B9689 Other specified bacterial agents as the cause of diseases classified elsewhere: Secondary | ICD-10-CM

## 2014-10-13 DIAGNOSIS — N760 Acute vaginitis: Principal | ICD-10-CM

## 2014-10-13 MED ORDER — TINIDAZOLE 500 MG PO TABS: 2000.0000 mg | ORAL_TABLET | Freq: Every day | ORAL | Status: DC

## 2014-10-13 NOTE — Telephone Encounter (Signed)
Patient called requesting refill of medication for BV-- states flagyl does not work anymore and requests Tindamax as that helped the best last time. Called patient and discussed symptoms-- recurrent BV. Tindamax 2 grams PO X 2 days e-prescribed per protocol. Patient informed. NO further questions or concerns.

## 2014-10-31 ENCOUNTER — Telehealth: Payer: Self-pay | Admitting: General Practice

## 2014-10-31 DIAGNOSIS — Z7251 High risk heterosexual behavior: Secondary | ICD-10-CM

## 2014-10-31 MED ORDER — LEVONORGESTREL 0.75 MG PO TABS: 0.7500 mg | ORAL_TABLET | Freq: Two times a day (BID) | ORAL | Status: DC

## 2014-10-31 NOTE — Telephone Encounter (Signed)
Patient called and left message requesting Rx for morning after pill. Per chart review patient has requested morning after pill multiple times in the past year. Spoke to Dr Jolayne Pantherconstant who agrees to Rx and patient needs appt to come in for contraception. Called patient stating I am returning her call and asked patient when she had sex. Patient states yesterday morning. Told patient Rx has been sent to pharmacy and asked if we could get her an appt for birth control. Patient states yes, she just didn't think she could get birth control since she takes flagyl every month for BV and someone told her it cancels her birth control out. Told patient that can be true with pills but not so much with long term options like IUD or nexplanon. Patient verbalized understanding and states she is interested in nexplanon. Told patient I will let the front office know and they will contact her with an appt. Told patient to use condoms between now and then for birth control. Patient verbalized understanding to all and had no questions

## 2014-11-22 ENCOUNTER — Telehealth: Payer: Self-pay

## 2014-11-22 NOTE — Telephone Encounter (Signed)
Patient called stating she has been bleeding for a couple weeks after taking Plan B and had read online that she could be experiencing a miscarriage and wants to know if this is normal or if she should be seen. Called patient-- states she took plan B on 10/31/14, began spotting 11/06/14, bled heavily the week after that and is now still spotting. Reports her period not due until 11/26/14. Informed patient this is likely due to the Plan B, throwing her body's natural clock off. Patient reports this has never happened in the 7 times she has taken plan B. Consulted with Dr. Jolayne Pantheronstant, who confirms that this is normal after taking medication and advises patient wait and see what her body does next month. Informed patient of this. Patient states "I still do not understand that." and hangs up. Patient has appointment 11/30/14 to discuss birth control options-- during which time concerns can be addressed if needed.

## 2014-11-30 ENCOUNTER — Ambulatory Visit: Payer: Self-pay | Admitting: Obstetrics & Gynecology

## 2014-12-21 ENCOUNTER — Inpatient Hospital Stay (HOSPITAL_COMMUNITY): Payer: 59

## 2014-12-21 ENCOUNTER — Encounter (HOSPITAL_COMMUNITY): Payer: Self-pay | Admitting: *Deleted

## 2014-12-21 ENCOUNTER — Inpatient Hospital Stay (HOSPITAL_COMMUNITY)
Admission: AD | Admit: 2014-12-21 | Discharge: 2014-12-21 | Disposition: A | Discharge status: Home / Self Care | Payer: 59 | Source: Ambulatory Visit | Attending: Obstetrics & Gynecology | Admitting: Obstetrics & Gynecology

## 2014-12-21 ENCOUNTER — Inpatient Hospital Stay (HOSPITAL_COMMUNITY)
Admission: AD | Admit: 2014-12-21 | Discharge: 2014-12-21 | Discharge status: Against Medical Advice | Payer: 59 | Source: Ambulatory Visit | Attending: Obstetrics & Gynecology | Admitting: Obstetrics & Gynecology

## 2014-12-21 DIAGNOSIS — Z3A08 8 weeks gestation of pregnancy: Secondary | ICD-10-CM | POA: Insufficient documentation

## 2014-12-21 DIAGNOSIS — Z5320 Procedure and treatment not carried out because of patient's decision for unspecified reasons: Secondary | ICD-10-CM | POA: Insufficient documentation

## 2014-12-21 DIAGNOSIS — R109 Unspecified abdominal pain: Secondary | ICD-10-CM | POA: Insufficient documentation

## 2014-12-21 DIAGNOSIS — O9989 Other specified diseases and conditions complicating pregnancy, childbirth and the puerperium: Secondary | ICD-10-CM | POA: Diagnosis not present

## 2014-12-21 DIAGNOSIS — O3680X0 Pregnancy with inconclusive fetal viability, not applicable or unspecified: Secondary | ICD-10-CM

## 2014-12-21 DIAGNOSIS — Z87891 Personal history of nicotine dependence: Secondary | ICD-10-CM | POA: Insufficient documentation

## 2014-12-21 DIAGNOSIS — O26899 Other specified pregnancy related conditions, unspecified trimester: Secondary | ICD-10-CM

## 2014-12-21 LAB — URINALYSIS, ROUTINE W REFLEX MICROSCOPIC
BILIRUBIN URINE: NEGATIVE
GLUCOSE, UA: NEGATIVE mg/dL
Ketones, ur: NEGATIVE mg/dL
Leukocytes, UA: NEGATIVE
Nitrite: NEGATIVE
PH: 6 (ref 5.0–8.0)
PROTEIN: NEGATIVE mg/dL
Specific Gravity, Urine: >1.03 — ABNORMAL HIGH (ref 1.005–1.030)
Urobilinogen, UA: 1 mg/dL (ref 0.0–1.0)

## 2014-12-21 LAB — CBC
HCT: 32.9 % — ABNORMAL LOW (ref 36.0–46.0)
Hemoglobin: 11.1 g/dL — ABNORMAL LOW (ref 12.0–15.0)
MCH: 29.3 pg (ref 26.0–34.0)
MCHC: 33.7 g/dL (ref 30.0–36.0)
MCV: 86.8 fL (ref 78.0–100.0)
Platelets: 207 10*3/uL (ref 150–400)
RBC: 3.79 MIL/uL — ABNORMAL LOW (ref 3.87–5.11)
RDW: 13.8 % (ref 11.5–15.5)
WBC: 7.6 10*3/uL (ref 4.0–10.5)

## 2014-12-21 LAB — HCG, QUANTITATIVE, PREGNANCY: hCG, Beta Chain, Quant, S: 358 m[IU]/mL — ABNORMAL HIGH (ref <5)

## 2014-12-21 LAB — URINE MICROSCOPIC-ADD ON

## 2014-12-21 LAB — WET PREP, GENITAL
Clue Cells Wet Prep HPF POC: NONE SEEN
TRICH WET PREP: NONE SEEN
YEAST WET PREP: NONE SEEN

## 2014-12-21 LAB — POCT PREGNANCY, URINE: PREG TEST UR: POSITIVE — AB

## 2014-12-21 NOTE — MAU Note (Signed)
Pt left MAU earlier this a.m., returned with C/O lower abd pain for the past week, thought she was going to start her period but didn't.  Pos HPT on Saturday.  Took Plan B in April.

## 2014-12-21 NOTE — MAU Note (Signed)
Pt presents complaining of lower abdominal cramping. +HPT on Saturday. Denies vaginal bleeding. States she thinks she has bacterial vaginosis. Just finished antibiotics for BV on Friday. Took Plan B around 4/10 and bled for 3 weeks. Has not had regular cycle since then.

## 2014-12-21 NOTE — Discharge Instructions (Signed)

## 2014-12-21 NOTE — MAU Note (Signed)
Pt states she gets BV every month, is patient in GYN clinic, takes flagyl very frequently.

## 2014-12-21 NOTE — MAU Provider Note (Signed)
History     CSN: [id number]  Arrival date and time: 12/21/14 4098   First Provider Initiated Contact with Patient 12/21/14 [phone]      Chief Complaint  Patient presents with  . Abdominal Pain   HPI   [name] is a 29 y.o. female [phone] at [redacted]w[redacted]d who presents to MAU with abdominal cramping. She had two positive pregnancy tests on Saturday and since then has had off and on cramping in her lower abdomen; both sides. She currently rates her pain 7/10; she has not taken anything over the counter for the pain.   OB History    Gravida Para Term Preterm AB TAB SAB Ectopic Multiple Living   0 0 0 1      Past Medical History  Diagnosis Date  . Headache(784.0)   . Heart murmur   . Anemia   . Ovarian cyst   . Abnormal Pap smear   . Preterm labor   . Bacterial vaginosis   . Vaginal Pap smear, abnormal     Past Surgical History  Procedure Laterality Date  . Myringotomy    . Therapeutic abortion      two  . Colposcopy      History reviewed. No pertinent family history.  History  Substance Use Topics  . Smoking status: Former Smoker -- 0.50 packs/day for 4 years    Types: Cigarettes    Quit date: 05/12/2013  . Smokeless tobacco: Never Used  . Alcohol Use: No     Comment: twice a month, social    Allergies:  Allergies  Allergen Reactions  . Latex Swelling and Other (See Comments)    Causes irritation    Prescriptions prior to admission  Medication Sig Dispense Refill Last Dose  . diphenhydrAMINE (BENADRYL) 25 mg capsule Take 1 capsule (25 mg total) by mouth every 6 (six) hours as needed for itching. 30 capsule 0   . hydrocortisone 2.5 % lotion Apply topically 2 (two) times daily. X 5 days qs 59 mL 0   . levonorgestrel (PLAN B) 0.75 MG tablet Take 1 tablet (0.75 mg total) by mouth every 12 (twelve) hours. 2 tablet 0   . meloxicam (MOBIC) 15 MG tablet Take 1 tablet (15 mg total) by mouth daily. Take 1 daily with food. 10 tablet 0   .  metroNIDAZOLE (FLAGYL) 500 MG tablet TAKE 1 TABLET BY MOUTH TWICE DAILY 14 tablet 0   . metroNIDAZOLE (FLAGYL) 500 MG tablet Take 1 tablet (500 mg total) by mouth 2 (two) times daily. 14 tablet 0   . Prenatal Multivit-Min-Fe-FA (PRENATAL VITAMINS) 0.8 MG tablet Take 1 tablet by mouth daily. 30 tablet 12   . tinidazole (TINDAMAX) 500 MG tablet Take 4 tablets (2,000 mg total) by mouth daily with breakfast. Take 4 tablets daily for 2 days. 8 tablet 0   . traMADol (ULTRAM) 50 MG tablet Take 1 tablet (50 mg total) by mouth every 6 (six) hours as needed. 15 tablet 0   . ulipristal acetate ([name]) 30 MG tablet Take 1 tablet (30 mg total) by mouth once. 1 tablet 0    Results for orders placed or performed during the hospital encounter of 12/21/14 (from the past 48 hour(s))  Urinalysis, Routine w reflex microscopic     Status: Abnormal   Collection Time: 12/21/14  8:42 AM  Result Value Ref Range   Color, Urine YELLOW YELLOW   APPearance CLEAR CLEAR   Specific Gravity, Urine >1.030 (  H) 1.005 - 1.030   pH 6.0 5.0 - 8.0   Glucose, UA NEGATIVE NEGATIVE mg/dL   Hgb urine dipstick SMALL (A) NEGATIVE   Bilirubin Urine NEGATIVE NEGATIVE   Ketones, ur NEGATIVE NEGATIVE mg/dL   Protein, ur NEGATIVE NEGATIVE mg/dL   Urobilinogen, UA 1.0 0.0 - 1.0 mg/dL   Nitrite NEGATIVE NEGATIVE   Leukocytes, UA NEGATIVE NEGATIVE  Urine microscopic-add on     Status: Abnormal   Collection Time: 12/21/14  8:42 AM  Result Value Ref Range   Squamous Epithelial / LPF MANY (A) RARE   RBC / HPF 3-6 <3 RBC/hpf   Bacteria, UA FEW (A) RARE   Urine-Other MUCOUS PRESENT   Pregnancy, urine POC     Status: Abnormal   Collection Time: 12/21/14  9:09 AM  Result Value Ref Range   Preg Test, Ur POSITIVE (A) NEGATIVE    Comment:        THE SENSITIVITY OF THIS METHODOLOGY IS >24 mIU/mL   Wet prep, genital     Status: Abnormal   Collection Time: 12/21/14  9:30 AM  Result Value Ref Range   Yeast Wet Prep HPF POC NONE SEEN NONE SEEN    Trich, Wet Prep NONE SEEN NONE SEEN   Clue Cells Wet Prep HPF POC NONE SEEN NONE SEEN   WBC, Wet Prep HPF POC MANY (A) NONE SEEN    Comment: MANY BACTERIA SEEN  hCG, quantitative, pregnancy     Status: Abnormal   Collection Time: 12/21/14  9:44 AM  Result Value Ref Range   hCG, Beta Chain, Quant, S 358 (H) <5 mIU/mL    Comment:          GEST. AGE      CONC.  (mIU/mL)   <=1 WEEK        5 - 50     2 WEEKS       50 - 500     3 WEEKS       100 - 10,000     4 WEEKS     1,000 - 30,000     5 WEEKS     3,500 - 115,000   6-8 WEEKS     12,000 - 270,000    12 WEEKS     15,000 - 220,000        FEMALE AND NON-PREGNANT FEMALE:     LESS THAN 5 mIU/mL   CBC     Status: Abnormal   Collection Time: 12/21/14  9:45 AM  Result Value Ref Range   WBC 7.6 4.0 - 10.5 K/uL   RBC 3.79 (L) 3.87 - 5.11 MIL/uL   Hemoglobin 11.1 (L) 12.0 - 15.0 g/dL   HCT 45.4 (L) 09.8 - 11.9 %   MCV 86.8 78.0 - 100.0 fL   MCH 29.3 26.0 - 34.0 pg   MCHC 33.7 30.0 - 36.0 g/dL   RDW 14.7 82.9 - 56.2 %   Platelets 207 150 - 400 K/uL   US Ob Comp Less 14 Wks  12/21/2014   CLINICAL DATA:  Abdominal pain with positive pregnancy test.  EXAM: OBSTETRIC <14 WK [place] AND TRANSVAGINAL OB US  TECHNIQUE: Both transabdominal and transvaginal ultrasound examinations were performed for complete evaluation of the gestation as well as the maternal uterus, adnexal regions, and pelvic cul-de-sac. Transvaginal technique was performed to assess early pregnancy.  COMPARISON:  None.  FINDINGS: No intrauterine gestational sac evident. Endometrium unremarkable. No evidence for fibroids in the uterus.  Maternal uterus/adnexae: Maternal  ovaries are normal in appearance with left ovarian corpus luteum cyst evident.  No adnexal mass. Sonographer reports trace intraperitoneal free fluid not well demonstrated on static images.  IMPRESSION: No intrauterine gestational sac. No adnexal mass. Given the history of a positive pregnancy test, differential  considerations include intrauterine gestation too early to visualize, completed abortion, or nonvisualized ectopic pregnancy. Close clinical correlation is recommended with serial beta-hCG and followup ultrasound as warranted.   Electronically Signed   By: Kennith CenterEric  Mansell M.D.   On: 12/21/2014 10:43   [place] Ob Transvaginal  12/21/2014   CLINICAL DATA:  Abdominal pain with positive pregnancy test.  EXAM: OBSTETRIC <14 WK US AND TRANSVAGINAL OB US  TECHNIQUE: Both transabdominal and transvaginal ultrasound examinations were performed for complete evaluation of the gestation as well as the maternal uterus, adnexal regions, and pelvic cul-de-sac. Transvaginal technique was performed to assess early pregnancy.  COMPARISON:  None.  FINDINGS: No intrauterine gestational sac evident. Endometrium unremarkable. No evidence for fibroids in the uterus.  Maternal uterus/adnexae: Maternal ovaries are normal in appearance with left ovarian corpus luteum cyst evident.  No adnexal mass. Sonographer reports trace intraperitoneal free fluid not well demonstrated on static images.  IMPRESSION: No intrauterine gestational sac. No adnexal mass. Given the history of a positive pregnancy test, differential considerations include intrauterine gestation too early to visualize, completed abortion, or nonvisualized ectopic pregnancy. Close clinical correlation is recommended with serial beta-hCG and followup ultrasound as warranted.   Electronically Signed   By: Kennith CenterEric  Mansell M.D.   On: 12/21/2014 10:43    Review of Systems  Gastrointestinal: Positive for abdominal pain. Negative for nausea, vomiting and constipation.  Genitourinary:       Denies vaginal bleeding    Physical Exam   Blood pressure 115/63, pulse 72, temperature 98.1 F (36.7 C), temperature source Oral, resp. rate 16, last menstrual period 10/26/2014, unknown if currently breastfeeding.  Physical Exam  Constitutional: She is oriented to person, place, and time. She  appears well-developed and well-nourished.  Non-toxic appearance. She does not have a sickly appearance. She does not appear ill. No distress.  HENT:  Head: Normocephalic.  Eyes: Pupils are equal, round, and reactive to light.  Neck: Neck supple.  Respiratory: Effort normal.  GI: Soft. There is no tenderness.  Genitourinary:  Speculum exam: Vagina - Small amount of creamy discharge, no odor Cervix - No contact bleeding Bimanual exam: Cervix closed Uterus non tender, normal size Adnexa non tender, no masses bilaterally GC/Chlam, wet prep done Chaperone present for exam.  Musculoskeletal: Normal range of motion.  Neurological: She is alert and oriented to person, place, and time.  Skin: Skin is warm. She is not diaphoretic.  Psychiatric: Her behavior is normal.    MAU Course  Procedures  None  MDM    Assessment and Plan   A:  1. Pregnancy of unknown anatomic location   2. Abdominal pain in pregnancy, antepartum     P:  Discharge home in stable condition Return in 48 hours for a repeat beta hcg leve; patient instructed to go to the [facility] Return to [facility]U if symptoms worsen Pelvic rest Ectopic precautions    Duane LopeJennifer I Rasch, NP 12/21/2014 9:24 AM

## 2014-12-21 NOTE — Progress Notes (Addendum)
Patient states she is unable to void for urine sample. Informed patient blood test has been ordered and lab will come to draw blood. Informed patient that results will take about an hour. Patient states she cannot wait an hour for results because she has to be at work. States she will try to void for urine sample, but still cannot stay and will have to go to work. States she may come back tomorrow. Still unable to void for specimen. States she will return tomorrow. AMA form signed.

## 2014-12-22 LAB — GC/CHLAMYDIA PROBE AMP (~~LOC~~) NOT AT ARMC
Chlamydia: NEGATIVE
NEISSERIA GONORRHEA: NEGATIVE

## 2014-12-22 LAB — HIV ANTIBODY (ROUTINE TESTING W REFLEX): HIV Screen 4th Generation wRfx: NONREACTIVE

## 2014-12-23 ENCOUNTER — Telehealth: Payer: Self-pay

## 2014-12-23 ENCOUNTER — Inpatient Hospital Stay (HOSPITAL_COMMUNITY)
Admission: EM | Admit: 2014-12-23 | Discharge: 2014-12-23 | Disposition: A | Discharge status: Home / Self Care | Payer: 59 | Source: Ambulatory Visit | Attending: Family Medicine | Admitting: Family Medicine

## 2014-12-23 ENCOUNTER — Other Ambulatory Visit: Payer: 59

## 2014-12-23 DIAGNOSIS — O3680X0 Pregnancy with inconclusive fetal viability, not applicable or unspecified: Secondary | ICD-10-CM

## 2014-12-23 DIAGNOSIS — O9989 Other specified diseases and conditions complicating pregnancy, childbirth and the puerperium: Secondary | ICD-10-CM | POA: Insufficient documentation

## 2014-12-23 DIAGNOSIS — R109 Unspecified abdominal pain: Secondary | ICD-10-CM | POA: Insufficient documentation

## 2014-12-23 LAB — HCG, QUANTITATIVE, PREGNANCY: hCG, Beta Chain, Quant, S: 798 m[IU]/mL — ABNORMAL HIGH (ref <5)

## 2014-12-23 NOTE — Telephone Encounter (Signed)
Per Latoya MaclachlanKaren Palmer patient had appropriate increase in HCG. Needs to return to MAU in 48 hours (sunday 12/25/14) for repeat HCG. Attempted to contact patient. No answer. Left message informing patient that most recent lab looked good but that she needs to return in 48 hours, Sunday,  to MAU for repeat HCG, call clinic with any questions.

## 2014-12-23 NOTE — MAU Note (Signed)
Pt presents to MAU for F/U BHCG.  Was instructed to go to clinic but states she has a work meeting this a.m. & had to come for labs now.  Pt states she is still having same cramping as before, denies bleeding.

## 2014-12-23 NOTE — MAU Note (Signed)
Pt states she cannot stay here for lab results, has to go to work.  Requests that we call her with results.

## 2014-12-25 ENCOUNTER — Inpatient Hospital Stay (HOSPITAL_COMMUNITY)
Admission: AD | Admit: 2014-12-25 | Discharge: 2014-12-25 | Disposition: A | Discharge status: Home / Self Care | Payer: 59 | Source: Ambulatory Visit | Attending: Family Medicine | Admitting: Family Medicine

## 2014-12-25 DIAGNOSIS — Z87891 Personal history of nicotine dependence: Secondary | ICD-10-CM | POA: Diagnosis not present

## 2014-12-25 DIAGNOSIS — Z3A08 8 weeks gestation of pregnancy: Secondary | ICD-10-CM | POA: Diagnosis not present

## 2014-12-25 DIAGNOSIS — O9989 Other specified diseases and conditions complicating pregnancy, childbirth and the puerperium: Secondary | ICD-10-CM | POA: Diagnosis not present

## 2014-12-25 DIAGNOSIS — Z79899 Other long term (current) drug therapy: Secondary | ICD-10-CM | POA: Diagnosis not present

## 2014-12-25 DIAGNOSIS — Z91040 Latex allergy status: Secondary | ICD-10-CM | POA: Diagnosis not present

## 2014-12-25 DIAGNOSIS — R109 Unspecified abdominal pain: Secondary | ICD-10-CM | POA: Diagnosis not present

## 2014-12-25 DIAGNOSIS — O26899 Other specified pregnancy related conditions, unspecified trimester: Secondary | ICD-10-CM

## 2014-12-25 LAB — HCG, QUANTITATIVE, PREGNANCY: HCG, BETA CHAIN, QUANT, S: 2259 m[IU]/mL — AB (ref <5)

## 2014-12-25 NOTE — MAU Provider Note (Signed)
History     CSN: [id number]  Arrival date and time: 12/25/14 1320   None     Chief Complaint  Patient presents with  . Labs Only   HPI Latoya Palmer is 29 y.o. [id number] [redacted]w[redacted]d weeks presents for repeat BHCG.  Initially seen 5/25 with cramping--BHCG that date 358.  She returned 5/27--BHCG 798.  Today she continued with cramping that she describing as lower mid abdominal-more constant that intermittent.  Rating it as a 7/10.  The patient does appear uncomfortable, smiling/engaging.   Neg for vaginal bleeding.  Has not taken Tylenol for her cramping because "I do not like to take medicine".   In a hurry to leave because she has her daughter with her.  She left me a number I can reach her with results.   Past Medical History  Diagnosis Date  . Headache(784.0)   . Heart murmur   . Anemia   . Ovarian cyst   . Abnormal Pap smear   . Preterm labor   . Bacterial vaginosis   . Vaginal Pap smear, abnormal     Past Surgical History  Procedure Laterality Date  . Myringotomy    . Therapeutic abortion      two  . Colposcopy      No family history on file.  History  Substance Use Topics  . Smoking status: Former Smoker -- 0.50 packs/day for 4 years    Types: Cigarettes    Quit date: 05/12/2013  . Smokeless tobacco: Never Used  . Alcohol Use: No     Comment: twice a month, social    Allergies:  Allergies  Allergen Reactions  . Latex Swelling and Other (See Comments)    Causes irritation    Prescriptions prior to admission  Medication Sig Dispense Refill Last Dose  . diphenhydrAMINE (BENADRYL) 25 mg capsule Take 1 capsule (25 mg total) by mouth every 6 (six) hours as needed for itching. 30 capsule 0   . hydrocortisone 2.5 % lotion Apply topically 2 (two) times daily. X 5 days qs 59 mL 0   . levonorgestrel (PLAN B) 0.75 MG tablet Take 1 tablet (0.75 mg total) by mouth every 12 (twelve) hours. 2 tablet 0   . meloxicam (MOBIC) 15 MG tablet Take 1 tablet (15 mg total) by mouth  daily. Take 1 daily with food. 10 tablet 0   . metroNIDAZOLE (FLAGYL) 500 MG tablet TAKE 1 TABLET BY MOUTH TWICE DAILY 14 tablet 0   . metroNIDAZOLE (FLAGYL) 500 MG tablet Take 1 tablet (500 mg total) by mouth 2 (two) times daily. 14 tablet 0   . Prenatal Multivit-Min-Fe-FA (PRENATAL VITAMINS) 0.8 MG tablet Take 1 tablet by mouth daily. 30 tablet 12   . tinidazole (TINDAMAX) 500 MG tablet Take 4 tablets (2,000 mg total) by mouth daily with breakfast. Take 4 tablets daily for 2 days. 8 tablet 0   . traMADol (ULTRAM) 50 MG tablet Take 1 tablet (50 mg total) by mouth every 6 (six) hours as needed. 15 tablet 0   . ulipristal acetate ([name]) 30 MG tablet Take 1 tablet (30 mg total) by mouth once. 1 tablet 0     Review of Systems  Constitutional: Negative for fever.  Gastrointestinal: Positive for abdominal pain (cramping).  Genitourinary:       Neg for vaginal bleeding  Neurological: Negative for headaches.   Physical Exam   Blood pressure 116/77, pulse 74, temperature 98.2 F (36.8 C), resp. rate 18, last menstrual period 10/26/2014,  unknown if currently breastfeeding.  Physical Exam  Vitals reviewed. Constitutional: She is oriented to person, place, and time. She appears well-developed and well-nourished. No distress.  HENT:  Head: Normocephalic.  Neck: Normal range of motion.  Cardiovascular: Normal rate.   Neurological: She is alert and oriented to person, place, and time.  Psychiatric: She has a normal mood and affect. Her behavior is normal.   Results for orders placed or performed during the hospital encounter of 12/25/14 (from the past 24 hour(s))  hCG, quantitative, pregnancy     Status: Abnormal   Collection Time: 12/25/14  1:30 PM  Result Value Ref Range   hCG, Beta Chain, Quant, S 2259 (H) <5 mIU/mL   MAU Course  Procedures  MDM Labs: BHCG Consulted with Dr. Shawnie PonsPratt re: f/u.  Reassuring BHCG rise--will schedule U/S for June 1 (1 week from last U/S) Discussed results  with patient.  She understands Radiology will call for U/S appt and agrees to return for worsening pain or vaginal bleeding  Assessment and Plan  A:  Reassuring rise of BHCG       Lower abdominal cramping        P:  Reported results to patient by phone      Told her Radiology will call her with appt for U/S to be done mid week      Ectopic precautions.       [name],EVE M 12/25/2014, 1:28 PM

## 2014-12-25 NOTE — MAU Note (Signed)
Pt presents to MAU for repeat BHCG. Reports lower abdominal cramping the same as 2 days ago. Denies any vaginal bleeding

## 2014-12-25 NOTE — Discharge Instructions (Signed)

## 2014-12-28 ENCOUNTER — Ambulatory Visit (HOSPITAL_COMMUNITY)
Admission: RE | Admit: 2014-12-28 | Discharge: 2014-12-28 | Disposition: A | Discharge status: Home / Self Care | Payer: 59 | Source: Ambulatory Visit | Attending: Gynecology | Admitting: Gynecology

## 2014-12-28 ENCOUNTER — Inpatient Hospital Stay (HOSPITAL_COMMUNITY)
Admission: AD | Admit: 2014-12-28 | Discharge: 2014-12-28 | Disposition: A | Discharge status: Home / Self Care | Payer: 59 | Source: Ambulatory Visit | Attending: Family Medicine | Admitting: Family Medicine

## 2014-12-28 ENCOUNTER — Ambulatory Visit: Payer: Self-pay | Admitting: Obstetrics & Gynecology

## 2014-12-28 DIAGNOSIS — R109 Unspecified abdominal pain: Secondary | ICD-10-CM

## 2014-12-28 DIAGNOSIS — Z3A09 9 weeks gestation of pregnancy: Secondary | ICD-10-CM

## 2014-12-28 DIAGNOSIS — O9989 Other specified diseases and conditions complicating pregnancy, childbirth and the puerperium: Secondary | ICD-10-CM | POA: Diagnosis not present

## 2014-12-28 DIAGNOSIS — O3680X0 Pregnancy with inconclusive fetal viability, not applicable or unspecified: Secondary | ICD-10-CM

## 2014-12-28 DIAGNOSIS — Z36 Encounter for antenatal screening of mother: Secondary | ICD-10-CM | POA: Insufficient documentation

## 2014-12-28 DIAGNOSIS — N831 Corpus luteum cyst: Secondary | ICD-10-CM | POA: Insufficient documentation

## 2014-12-28 DIAGNOSIS — O3481 Maternal care for other abnormalities of pelvic organs, first trimester: Secondary | ICD-10-CM | POA: Insufficient documentation

## 2014-12-28 DIAGNOSIS — Z3A01 Less than 8 weeks gestation of pregnancy: Secondary | ICD-10-CM | POA: Diagnosis not present

## 2014-12-28 DIAGNOSIS — O26899 Other specified pregnancy related conditions, unspecified trimester: Secondary | ICD-10-CM

## 2014-12-28 NOTE — MAU Provider Note (Signed)
Patient here for follow up US: currently having no pain or bleeding. Patient had appropriate rising quants and was scheduled for an US today for follow up.   US shows:  [place] Ob Transvaginal  12/28/2014   CLINICAL DATA:  Pregnant, follow-up for viability  EXAM: TRANSVAGINAL OB ULTRASOUND  TECHNIQUE: Transvaginal ultrasound was performed for complete evaluation of the gestation as well as the maternal uterus, adnexal regions, and pelvic cul-de-sac.  COMPARISON:  12/21/2014  FINDINGS: Intrauterine gestational sac: Visualized/normal in shape.  Yolk sac:  Not visualized  Embryo:  Not visualized  MSD: 6.9  mm   5 w   2  d  Maternal uterus/adnexae: Bilateral ovaries are within normal limits, noting a left corpus luteal cyst.  Trace fluid in the right adnexa.  IMPRESSION: Single intrauterine gestational sac, measuring 5 weeks 2 days by mean sac diameter.  No definite yolk sac or fetal pole is visualized.  Consider follow-up pelvic ultrasound in two weeks to confirm viability as clinically warranted.   Electronically Signed   By: Charline BillsSriyesh  Krishnan M.D.   On: 12/28/2014 09:11    Informed the patient that she will need to return in 7-10 days for a repeat US and that likely she is not as far along as she thought. Ectopic precautions given at length and patient is to return to MAU with any pain or bleeding. Patient is to remain on pelvic rest until US confirms where the location of the pregnancy is.    Duane LopeJennifer I Sosaia Pittinger, NP 12/28/2014 10:04 AM

## 2014-12-28 NOTE — Progress Notes (Signed)
Pt seen by APP only

## 2015-02-08 ENCOUNTER — Telehealth: Payer: Self-pay | Admitting: *Deleted

## 2015-02-08 DIAGNOSIS — B9689 Other specified bacterial agents as the cause of diseases classified elsewhere: Secondary | ICD-10-CM

## 2015-02-08 DIAGNOSIS — N760 Acute vaginitis: Principal | ICD-10-CM

## 2015-02-08 MED ORDER — TINIDAZOLE 500 MG PO TABS: 2.0000 g | ORAL_TABLET | Freq: Every day | ORAL | Status: AC

## 2015-02-08 NOTE — Telephone Encounter (Signed)
Patient called and left message stating that she needs a refill for BV. Pt requesting tindamax. Rx to pharmacy per protocol.

## 2015-04-04 ENCOUNTER — Telehealth: Payer: Self-pay

## 2015-04-04 NOTE — Telephone Encounter (Addendum)
Pt states that she needs a refill on her flagyl because "I built up a resistance to it, it did not clear up infection."  9/7  1430  Called pt and left message to call us back and state whether a detailed message can be left on her voice mail.  **Pt received Rx for Tindamax on 7/13.  Need to review how she took the medication and clarify if she is now requesting Flagyl.  Diane Day RNC

## 2015-04-05 MED ORDER — METRONIDAZOLE 500 MG PO TABS: 500.0000 mg | ORAL_TABLET | Freq: Two times a day (BID) | ORAL | Status: DC

## 2015-04-05 NOTE — Telephone Encounter (Addendum)
1435- Pt called back and stated that she missed our call and that we can leave a detailed message on her VM.   1650  Call returned to pt.  She reports still having a discharge with odor. She confirmed that she was given Tindamax in July which did not resolve the discharge. She requests Flagyl.  Per chart review, pt is [redacted] wks pregnant. I asked where she is getting her prenatal care and she responded, "I am no longer with child."  No further explanation was given.  Pt advised that Flagyl will be sent to her pharmacy this time. If the discharge persists, she will need to have office appt for evaluation.  Pt agreed and voiced understanding.  Diane Day RNC

## 2015-06-09 ENCOUNTER — Ambulatory Visit: Payer: 59 | Admitting: Obstetrics & Gynecology

## 2015-10-03 ENCOUNTER — Encounter: Payer: Self-pay | Admitting: General Practice

## 2015-10-03 ENCOUNTER — Telehealth: Payer: Self-pay | Admitting: General Practice

## 2015-10-03 NOTE — Telephone Encounter (Signed)
Patient called and left message requesting refill on flagyl. Per chart review, patient has not been seen in office since 2015 and has received a minimum of 3 prescriptions for flagyl or tindamax through telephone encounter. Called patient and asked what symptoms she has been having. Patient states she has been irritated, uncomfortable, itching at times and a discharge with a slight smell. Discussed having patient come in for an office for an exam as she could have a yeast infection or BV and offered Friday appt of 3/10 @ 8am. Patient states she has never had a yeast infection before and knows this is BV. Patient states she cannot wait till Friday and plans on going to MAU later tonight after work. Patient had no questions

## 2015-10-04 ENCOUNTER — Telehealth: Payer: Self-pay | Admitting: *Deleted

## 2015-10-04 ENCOUNTER — Inpatient Hospital Stay (HOSPITAL_COMMUNITY)
Admission: AD | Admit: 2015-10-04 | Discharge: 2015-10-04 | Disposition: A | Discharge status: Home / Self Care | Payer: 59 | Source: Ambulatory Visit | Attending: Obstetrics & Gynecology | Admitting: Obstetrics & Gynecology

## 2015-10-04 ENCOUNTER — Encounter (HOSPITAL_COMMUNITY): Payer: Self-pay | Admitting: *Deleted

## 2015-10-04 DIAGNOSIS — B9689 Other specified bacterial agents as the cause of diseases classified elsewhere: Secondary | ICD-10-CM

## 2015-10-04 DIAGNOSIS — Z87891 Personal history of nicotine dependence: Secondary | ICD-10-CM | POA: Diagnosis not present

## 2015-10-04 DIAGNOSIS — Z3202 Encounter for pregnancy test, result negative: Secondary | ICD-10-CM | POA: Diagnosis not present

## 2015-10-04 DIAGNOSIS — A499 Bacterial infection, unspecified: Secondary | ICD-10-CM | POA: Diagnosis not present

## 2015-10-04 DIAGNOSIS — N898 Other specified noninflammatory disorders of vagina: Secondary | ICD-10-CM

## 2015-10-04 DIAGNOSIS — N760 Acute vaginitis: Secondary | ICD-10-CM | POA: Diagnosis not present

## 2015-10-04 LAB — WET PREP, GENITAL
Sperm: NONE SEEN
Trich, Wet Prep: NONE SEEN
YEAST WET PREP: NONE SEEN

## 2015-10-04 LAB — URINE MICROSCOPIC-ADD ON

## 2015-10-04 LAB — POCT PREGNANCY, URINE: PREG TEST UR: NEGATIVE

## 2015-10-04 LAB — URINALYSIS, ROUTINE W REFLEX MICROSCOPIC
Bilirubin Urine: NEGATIVE
GLUCOSE, UA: NEGATIVE mg/dL
Ketones, ur: NEGATIVE mg/dL
Nitrite: NEGATIVE
Protein, ur: NEGATIVE mg/dL
SPECIFIC GRAVITY, URINE: 1.025 (ref 1.005–1.030)
pH: 6 (ref 5.0–8.0)

## 2015-10-04 MED ORDER — NORGESTIMATE-ETH ESTRADIOL 0.25-35 MG-MCG PO TABS: 1.0000 | ORAL_TABLET | Freq: Every day | ORAL | Status: DC

## 2015-10-04 MED ORDER — CLINDAMYCIN HCL 300 MG PO CAPS: 300.0000 mg | ORAL_CAPSULE | Freq: Two times a day (BID) | ORAL | Status: DC

## 2015-10-04 NOTE — MAU Note (Signed)
Pt presents to MAU with complaints of vaginal discharge with an odor. States she has a history of BV.

## 2015-10-04 NOTE — MAU Provider Note (Signed)
History     CSN: [id number]  Arrival date and time: 10/04/15 0757   First Provider Initiated Contact with Patient 10/04/15 [phone]         Chief Complaint  Patient presents with  . Vaginal Discharge   HPI Comments: Latoya Palmer is a 30 y.o. Female who presents for vaginal discharge & irritation. States she gets BV every month & knows that's what's going on now. Called clinic for refill of her flagyl but was told she would need an appointment since she hasn't been seen in the clinic since 2015. Has been treated in the past with flagyl & tindamax.   Female GU Problem The patient's primary symptoms include genital itching, a genital odor and vaginal discharge. The patient's pertinent negatives include no genital lesions, missed menses, pelvic pain or vaginal bleeding. This is a recurrent problem. The current episode started in the past 7 days. The problem occurs constantly. The problem has been unchanged. The patient is experiencing no pain. She is not pregnant. Associated symptoms include dysuria. Pertinent negatives include no abdominal pain, fever, flank pain, frequency, hematuria, nausea, painful intercourse, urgency or vomiting. The vaginal discharge was malodorous, grey and thin. There has been no bleeding. Nothing aggravates the symptoms. She has tried nothing for the symptoms. She is sexually active. No, her partner does not have an STD. She uses condoms for contraception. Her menstrual history has been regular. Her past medical history is significant for vaginosis (recurrent BV - monthly for years). There is no history of an STD.    OB History    Gravida Para Term Preterm AB TAB SAB Ectopic Multiple Living   0 0 0 1      Past Medical History  Diagnosis Date  . Headache(784.0)   . Heart murmur   . Anemia   . Ovarian cyst   . Abnormal Pap smear   . Preterm labor   . Bacterial vaginosis   . Vaginal Pap smear, abnormal     Past Surgical History  Procedure  Laterality Date  . Myringotomy    . Therapeutic abortion      two  . Colposcopy      History reviewed. No pertinent family history.  Social History  Substance Use Topics  . Smoking status: Former Smoker -- 0.50 packs/day for 4 years    Types: Cigarettes    Quit date: 05/12/2013  . Smokeless tobacco: Never Used  . Alcohol Use: No     Comment: twice a month, social    Allergies:  Allergies  Allergen Reactions  . Latex Swelling and Other (See Comments)    Causes irritation    Prescriptions prior to admission  Medication Sig Dispense Refill Last Dose  . diphenhydrAMINE (BENADRYL) 25 mg capsule Take 1 capsule (25 mg total) by mouth every 6 (six) hours as needed for itching. 30 capsule 0   . metroNIDAZOLE (FLAGYL) 500 MG tablet Take 1 tablet (500 mg total) by mouth 2 (two) times daily. 14 tablet 0   . Prenatal Multivit-Min-Fe-FA (PRENATAL VITAMINS) 0.8 MG tablet Take 1 tablet by mouth daily. 30 tablet 12     Review of Systems  Constitutional: Negative.  Negative for fever.  Gastrointestinal: Negative.  Negative for nausea, vomiting and abdominal pain.  Genitourinary: Positive for dysuria and vaginal discharge. Negative for urgency, frequency, hematuria, flank pain, pelvic pain and missed menses.       + vaginal discharge & irritation No vaginal bleeding No  dyspareunia or postcoital bleeding   Physical Exam   Blood pressure 116/77, pulse 75, temperature 98 F (36.7 C), resp. rate 16, last menstrual period 09/03/2015, unknown if currently breastfeeding.  Physical Exam  Nursing note and vitals reviewed. Constitutional: She is oriented to person, place, and time. She appears well-developed and well-nourished. No distress.  HENT:  Head: Normocephalic and atraumatic.  Eyes: Conjunctivae are normal. Right eye exhibits no discharge. Left eye exhibits no discharge. No scleral icterus.  Neck: Normal range of motion.  Cardiovascular: Normal rate.   Respiratory: Effort normal. No  respiratory distress.  GI: Soft. She exhibits no distension. There is no tenderness.  Genitourinary: Uterus normal. Cervix exhibits discharge (minimal amount of clear mucoid discharge). Cervix exhibits no motion tenderness. No bleeding in the vagina. Vaginal discharge (small amount of thin gray discharge) found.    Cervix closed  Neurological: She is alert and oriented to person, place, and time.  Skin: Skin is warm and dry. She is not diaphoretic.  Psychiatric: She has a normal mood and affect. Her behavior is normal. Judgment and thought content normal.    MAU Course  Procedures Results for orders placed or performed during the hospital encounter of 10/04/15 (from the past 24 hour(s))  Urinalysis, Routine w reflex microscopic (not at [facility])     Status: Abnormal   Collection Time: 10/04/15  8:10 AM  Result Value Ref Range   Color, Urine YELLOW YELLOW   APPearance CLEAR CLEAR   Specific Gravity, Urine 1.025 1.005 - 1.030   pH 6.0 5.0 - 8.0   Glucose, UA NEGATIVE NEGATIVE mg/dL   Hgb urine dipstick SMALL (A) NEGATIVE   Bilirubin Urine NEGATIVE NEGATIVE   Ketones, ur NEGATIVE NEGATIVE mg/dL   Protein, ur NEGATIVE NEGATIVE mg/dL   Nitrite NEGATIVE NEGATIVE   Leukocytes, UA TRACE (A) NEGATIVE  Urine microscopic-add on     Status: Abnormal   Collection Time: 10/04/15  8:10 AM  Result Value Ref Range   Squamous Epithelial / LPF 0-5 (A) NONE SEEN   WBC, UA 0-5 0 - 5 WBC/hpf   RBC / HPF 0-5 0 - 5 RBC/hpf   Bacteria, UA MANY (A) NONE SEEN   Urine-Other MUCOUS PRESENT   Wet prep, genital     Status: Abnormal   Collection Time: 10/04/15  8:45 AM  Result Value Ref Range   Yeast Wet Prep HPF POC NONE SEEN NONE SEEN   Trich, Wet Prep NONE SEEN NONE SEEN   Clue Cells Wet Prep HPF POC PRESENT (A) NONE SEEN   WBC, Wet Prep HPF POC MODERATE (A) NONE SEEN   Sperm NONE SEEN   Pregnancy, urine POC     Status: None   Collection Time: 10/04/15  8:51 AM  Result Value Ref Range   Preg Test, Ur  NEGATIVE NEGATIVE    MDM UPT negative Pt uses plain dove soap. Denies changes in soap, detergent, etc. Denies tobacco use or douching. Uses condoms when has intercourse, last intercourse > 1 month ago.   Upon review of records, pt had abnormal Pap with [facility] in 2015 & no showed for her colposcopy appointments. Per patient, she had a normal pap with her PCP ([facility]) last year but plans on returning to the clinic for future exams. Encouraged patient to call Clinic for appt & possible colposcopy.  Assessment and Plan  A: 1. BV (bacterial vaginosis)   2. Vaginal discharge     P: Discharge home Rx clindamycin 300 mg BID x 7  days & sprintec Pt to call clinic for colposcopy Continue use of dove soap & condom use.  Recommend daily probiotic  Judeth Hornrin Damain Broadus 10/04/2015, 8:26 AM

## 2015-10-04 NOTE — Telephone Encounter (Signed)
Latoya Palmer called and left a message yesterday afternoon.  I called her today and she states she went to ER and wasn't happy that she had to do that.  She wants to talk to someone, will have leadership talk with her.

## 2015-10-04 NOTE — Discharge Instructions (Signed)
Use condoms Take probiotics with lactobacillus  Bacterial Vaginosis Bacterial vaginosis is a vaginal infection that occurs when the normal balance of bacteria in the vagina is disrupted. It results from an overgrowth of certain bacteria. This is the most common vaginal infection in women of childbearing age. Treatment is important to prevent complications, especially in pregnant women, as it can cause a premature delivery. CAUSES  Bacterial vaginosis is caused by an increase in harmful bacteria that are normally present in smaller amounts in the vagina. Several different kinds of bacteria can cause bacterial vaginosis. However, the reason that the condition develops is not fully understood. RISK FACTORS Certain activities or behaviors can put you at an increased risk of developing bacterial vaginosis, including:  Having a new sex partner or multiple sex partners.  Douching.  Using an intrauterine device (IUD) for contraception. Women do not get bacterial vaginosis from toilet seats, bedding, swimming pools, or contact with objects around them. SIGNS AND SYMPTOMS  Some women with bacterial vaginosis have no signs or symptoms. Common symptoms include:  Grey vaginal discharge.  A fishlike odor with discharge, especially after sexual intercourse.  Itching or burning of the vagina and vulva.  Burning or pain with urination. DIAGNOSIS  Your health care provider will take a medical history and examine the vagina for signs of bacterial vaginosis. A sample of vaginal fluid may be taken. Your health care provider will look at this sample under a microscope to check for bacteria and abnormal cells. A vaginal pH test may also be done.  TREATMENT  Bacterial vaginosis may be treated with antibiotic medicines. These may be given in the form of a pill or a vaginal cream. A second round of antibiotics may be prescribed if the condition comes back after treatment. Because bacterial vaginosis increases  your risk for sexually transmitted diseases, getting treated can help reduce your risk for chlamydia, gonorrhea, HIV, and herpes. HOME CARE INSTRUCTIONS   Only take over-the-counter or prescription medicines as directed by your health care provider.  If antibiotic medicine was prescribed, take it as directed. Make sure you finish it even if you start to feel better.  During treatment, it is important that you follow these instructions:  Avoid sexual activity or use condoms correctly.  Do not douche.  Avoid alcohol as directed by your health care provider.  Avoid breastfeeding as directed by your health care provider. SEEK MEDICAL CARE IF:   Your symptoms are not improving after 3 days of treatment.  You have increased discharge or pain.  You have a fever. MAKE SURE YOU:   Understand these instructions.  Will watch your condition.  Will get help right away if you are not doing well or get worse. FOR MORE INFORMATION  Centers for Disease Control and Prevention, Division of STD Prevention: SolutionApps.co.zawww.cdc.gov/std American Sexual Health Association (ASHA): www.ashastd.org    This information is not intended to replace advice given to you by your health care provider. Make sure you discuss any questions you have with your health care provider.   Document Released: 07/15/2005 Document Revised: 08/05/2014 Document Reviewed: 02/24/2013 Elsevier Interactive Patient Education [employer]2016 [employer]vier Inc.

## 2015-10-18 ENCOUNTER — Encounter (HOSPITAL_COMMUNITY): Payer: Self-pay | Admitting: *Deleted

## 2015-10-18 ENCOUNTER — Inpatient Hospital Stay (HOSPITAL_COMMUNITY)
Admission: AD | Admit: 2015-10-18 | Discharge: 2015-10-18 | Disposition: A | Discharge status: Home / Self Care | Payer: 59 | Source: Ambulatory Visit | Attending: Obstetrics & Gynecology | Admitting: Obstetrics & Gynecology

## 2015-10-18 DIAGNOSIS — Z87891 Personal history of nicotine dependence: Secondary | ICD-10-CM | POA: Diagnosis not present

## 2015-10-18 DIAGNOSIS — N939 Abnormal uterine and vaginal bleeding, unspecified: Secondary | ICD-10-CM | POA: Insufficient documentation

## 2015-10-18 DIAGNOSIS — Z3202 Encounter for pregnancy test, result negative: Secondary | ICD-10-CM | POA: Insufficient documentation

## 2015-10-18 LAB — URINALYSIS, ROUTINE W REFLEX MICROSCOPIC
Bilirubin Urine: NEGATIVE
Glucose, UA: NEGATIVE mg/dL
KETONES UR: NEGATIVE mg/dL
Leukocytes, UA: NEGATIVE
Nitrite: NEGATIVE
PH: 6 (ref 5.0–8.0)
Protein, ur: NEGATIVE mg/dL
Specific Gravity, Urine: 1.025 (ref 1.005–1.030)

## 2015-10-18 LAB — CBC
HCT: 34.5 % — ABNORMAL LOW (ref 36.0–46.0)
HEMOGLOBIN: 11.6 g/dL — AB (ref 12.0–15.0)
MCH: 28.9 pg (ref 26.0–34.0)
MCHC: 33.6 g/dL (ref 30.0–36.0)
MCV: 85.8 fL (ref 78.0–100.0)
Platelets: 234 10*3/uL (ref 150–400)
RBC: 4.02 MIL/uL (ref 3.87–5.11)
RDW: 13.7 % (ref 11.5–15.5)
WBC: 4.8 10*3/uL (ref 4.0–10.5)

## 2015-10-18 LAB — URINE MICROSCOPIC-ADD ON: WBC, UA: NONE SEEN WBC/hpf (ref 0–5)

## 2015-10-18 LAB — HCG, QUANTITATIVE, PREGNANCY

## 2015-10-18 LAB — POCT PREGNANCY, URINE: Preg Test, Ur: NEGATIVE

## 2015-10-18 NOTE — MAU Note (Signed)
Pt states she started bleeding yesterday afternoon - started as spotting, then became heavy.  Also severe cramping, passing clots.  Pos HPT 4 days ago.

## 2015-10-18 NOTE — Discharge Instructions (Signed)

## 2015-10-18 NOTE — MAU Provider Note (Signed)
History     CSN: [id number]  Arrival date and time: 10/18/15 4098   First Provider Initiated Contact with Patient 10/18/15 [phone]         Chief Complaint  Patient presents with  . Vaginal Bleeding  . Abdominal Pain   HPI  Latoya Palmer is a 30 y.o. female who presents for vaginal bleeding. Reports having a positive HPT on Saturday. Started spotting yesterday morning. Bleeding increased last night & has been more like a menses. Also reports period-like cramps since last night. Cramping has improved this morning.  Denies n/v/d, constipation, dysuria, or fever.     OB History    Gravida Para Term Preterm AB TAB SAB Ectopic Multiple Living   0 0 0 1      Past Medical History  Diagnosis Date  . Headache(784.0)   . Heart murmur   . Anemia   . Ovarian cyst   . Abnormal Pap smear   . Preterm labor   . Bacterial vaginosis   . Vaginal Pap smear, abnormal     Past Surgical History  Procedure Laterality Date  . Myringotomy    . Therapeutic abortion      two  . Colposcopy      History reviewed. No pertinent family history.  Social History  Substance Use Topics  . Smoking status: Former Smoker -- 0.50 packs/day for 4 years    Types: Cigarettes    Quit date: 05/12/2013  . Smokeless tobacco: Never Used  . Alcohol Use: No     Comment: twice a month, social    Allergies:  Allergies  Allergen Reactions  . Latex Swelling and Other (See Comments)    Causes irritation    Prescriptions prior to admission  Medication Sig Dispense Refill Last Dose  . clindamycin (CLEOCIN) 300 MG capsule Take 1 capsule (300 mg total) by mouth 2 (two) times daily. 14 capsule 0   . norgestimate-ethinyl estradiol (SPRINTEC 28) 0.25-35 MG-MCG tablet Take 1 tablet by mouth daily. 1 Package 11     Review of Systems  Constitutional: Negative.   Gastrointestinal: Positive for abdominal pain. Negative for nausea, vomiting, diarrhea and constipation.  Genitourinary:       +  vaginal bleeding   Physical Exam   Blood pressure 110/72, pulse 62, temperature 97.5 F (36.4 C), temperature source Oral, resp. rate 16, height  (1.651 m), weight 133 lb 6.4 oz (60.51 kg), last menstrual period 09/04/2015, unknown if currently breastfeeding.  Physical Exam  Nursing note and vitals reviewed. Constitutional: She is oriented to person, place, and time. She appears well-developed and well-nourished. No distress.  HENT:  Head: Normocephalic and atraumatic.  Eyes: Conjunctivae are normal. Right eye exhibits no discharge. Left eye exhibits no discharge. No scleral icterus.  Neck: Normal range of motion.  Cardiovascular: Normal rate, regular rhythm and normal heart sounds.   No murmur heard. Respiratory: Effort normal and breath sounds normal. No respiratory distress. She has no wheezes.  GI: Soft. Bowel sounds are normal. She exhibits no distension. There is no tenderness.  Genitourinary: Uterus normal. Cervix exhibits no motion tenderness and no discharge. There is bleeding (small amount of dark red blood) in the vagina.  Cervix closed  Neurological: She is alert and oriented to person, place, and time.  Skin: Skin is warm and dry. She is not diaphoretic.  Psychiatric: She has a normal mood and affect. Her behavior is normal. Judgment and thought content normal.  MAU Course  Procedures Results for orders placed or performed during the hospital encounter of 10/18/15 (from the past 24 hour(s))  Urinalysis, Routine w reflex microscopic ([facility])     Status: Abnormal   Collection Time: 10/18/15  7:55 AM  Result Value Ref Range   Color, Urine YELLOW YELLOW   APPearance CLEAR CLEAR   Specific Gravity, Urine 1.025 1.005 - 1.030   pH 6.0 5.0 - 8.0   Glucose, UA NEGATIVE NEGATIVE mg/dL   Hgb urine dipstick LARGE (A) NEGATIVE   Bilirubin Urine NEGATIVE NEGATIVE   Ketones, ur NEGATIVE NEGATIVE mg/dL   Protein, ur NEGATIVE NEGATIVE mg/dL   Nitrite NEGATIVE NEGATIVE    Leukocytes, UA NEGATIVE NEGATIVE  Urine microscopic-add on     Status: Abnormal   Collection Time: 10/18/15  7:55 AM  Result Value Ref Range   Squamous Epithelial / LPF 0-5 (A) NONE SEEN   WBC, UA NONE SEEN 0 - 5 WBC/hpf   RBC / HPF TOO NUMEROUS TO COUNT 0 - 5 RBC/hpf   Bacteria, UA RARE (A) NONE SEEN   Urine-Other MUCOUS PRESENT   Pregnancy, urine POC     Status: None   Collection Time: 10/18/15  8:03 AM  Result Value Ref Range   Preg Test, Ur NEGATIVE NEGATIVE  CBC     Status: Abnormal   Collection Time: 10/18/15  8:20 AM  Result Value Ref Range   WBC 4.8 4.0 - 10.5 K/uL   RBC 4.02 3.87 - 5.11 MIL/uL   Hemoglobin 11.6 (L) 12.0 - 15.0 g/dL   HCT 96.034.5 (L) 45.436.0 - 09.846.0 %   MCV 85.8 78.0 - 100.0 fL   MCH 28.9 26.0 - 34.0 pg   MCHC 33.6 30.0 - 36.0 g/dL   RDW 11.913.7 14.711.5 - 82.915.5 %   Platelets 234 150 - 400 K/uL  hCG, quantitative, pregnancy     Status: None   Collection Time: 10/18/15  8:20 AM  Result Value Ref Range   hCG, Beta Chain, Quant, S <1 <5 mIU/mL    MDM Pt had negative UPT in MAU on 3/8 UPT negative CBC, BHCG  Assessment and Plan  A: 1. Negative pregnancy test   2. Vaginal bleeding     P: Discharge home Will start taking OCPs that were previously prescribed GC/CT pending F/u with Clinic as needed or for routine care  Judeth Hornrin Teodor Prater 10/18/2015, 8:06 AM

## 2015-10-19 ENCOUNTER — Encounter: Payer: Self-pay | Admitting: *Deleted

## 2015-10-19 LAB — GC/CHLAMYDIA PROBE AMP (~~LOC~~) NOT AT ARMC
Chlamydia: NEGATIVE
NEISSERIA GONORRHEA: NEGATIVE

## 2015-10-24 ENCOUNTER — Encounter: Payer: Self-pay | Admitting: Family Medicine

## 2015-11-23 ENCOUNTER — Telehealth: Payer: Self-pay

## 2015-11-23 DIAGNOSIS — N760 Acute vaginitis: Principal | ICD-10-CM

## 2015-11-23 DIAGNOSIS — B9689 Other specified bacterial agents as the cause of diseases classified elsewhere: Secondary | ICD-10-CM

## 2015-11-23 NOTE — Telephone Encounter (Signed)
Pt called requesting another Rx for chronic BV.

## 2015-11-24 MED ORDER — METRONIDAZOLE 500 MG PO TABS: 500.0000 mg | ORAL_TABLET | Freq: Two times a day (BID) | ORAL | Status: DC

## 2015-11-24 NOTE — Telephone Encounter (Signed)
Morrie Sheldonshley left another voice message yesterday she is calling again for a refill of BV medicine- states didn't get a call back after her last message.

## 2015-11-24 NOTE — Telephone Encounter (Signed)
Per chart review patient has been treated often for bv with both flagyl and clindamycin-last in March . Discussed with Dr. Deborha PaymentAnyanwu-may have flagyl ,but must keep appointment as scheduled to discuss suppression theraphy for chronic bv. Called patient and she c/o discharge and bad odor. Explained we will prescribe flagyl but keep appointment as scheduled for pap and discuss suppression theraphy. She agreed with plan.

## 2015-12-11 ENCOUNTER — Encounter: Payer: Self-pay | Admitting: Obstetrics & Gynecology

## 2015-12-11 ENCOUNTER — Ambulatory Visit: Payer: 59 | Admitting: Obstetrics & Gynecology

## 2015-12-11 NOTE — Progress Notes (Signed)
Morrie Sheldonshley had an appointment scheduled today for pap smear and to discuss tx for chronic BV that she did not keep. Reviewed with provider (Dr. Debroah LoopArnold)  and patient may schedule at anytime if she desires.

## 2016-03-05 ENCOUNTER — Other Ambulatory Visit: Payer: Self-pay

## 2016-03-05 ENCOUNTER — Telehealth: Payer: Self-pay | Admitting: *Deleted

## 2016-03-05 NOTE — Telephone Encounter (Signed)
[facility]

## 2016-03-05 NOTE — Telephone Encounter (Signed)
Pt called to request a refill of flagyl for BV.

## 2016-03-05 NOTE — Telephone Encounter (Signed)
Patient is requesting a refill on Flagyl for BV. I have given her one dose of Flagyl and advised patient to come in for appointment if it does not go away.

## 2016-03-07 ENCOUNTER — Telehealth: Payer: Self-pay | Admitting: General Practice

## 2016-03-07 DIAGNOSIS — N760 Acute vaginitis: Principal | ICD-10-CM

## 2016-03-07 DIAGNOSIS — B9689 Other specified bacterial agents as the cause of diseases classified elsewhere: Secondary | ICD-10-CM

## 2016-03-07 MED ORDER — METRONIDAZOLE 500 MG PO TABS: 500.0000 mg | ORAL_TABLET | Freq: Two times a day (BID) | ORAL | 0 refills | Status: DC

## 2016-03-07 NOTE — Telephone Encounter (Signed)
Patient called into front office stating she needs to speak with someone. Called patient and she states she spoke to someone the other day and flagyl was supposed to be called in for her for BV but she just went by the pharmacy and it isn't there. Apologized to patient & ordered med. Informed patient. Patient verbalized understanding & had no questions

## 2016-04-02 ENCOUNTER — Telehealth: Payer: Self-pay | Admitting: *Deleted

## 2016-04-02 DIAGNOSIS — Z7251 High risk heterosexual behavior: Secondary | ICD-10-CM

## 2016-04-02 NOTE — Telephone Encounter (Signed)
Pt left message for Plan B to be called in to her pharmacy. She is aware that it does not require a prescription however her insurance will not pay unless it is called/sent in. I returned the call to pt and left message requesting her to call back and state whether a detailed message can be left on her voice mail. *Need to clarify w/pt whether she has been taking OCP's as prescribed in March. If so, Plan B should not be necessary. If not, confirm that the unprotected intercourse occurred within the last 72 hours.

## 2016-04-03 ENCOUNTER — Telehealth: Payer: Self-pay

## 2016-04-03 MED ORDER — LEVONORGESTREL 0.75 MG PO TABS: 0.7500 mg | ORAL_TABLET | Freq: Two times a day (BID) | ORAL | 0 refills | Status: DC

## 2016-04-03 NOTE — Telephone Encounter (Signed)
Plan B called to pharmacy.

## 2016-04-03 NOTE — Telephone Encounter (Signed)
Received a call from pt's [facility] pharmacy and was informed that pt's insurance does not cover the 075 mg (2) tablet but they will cover the 1.25 mg one tablet.  Per Anyanwu, okay to make the dosage change.

## 2016-05-25 ENCOUNTER — Emergency Department (HOSPITAL_COMMUNITY)
Admission: EM | Admit: 2016-05-25 | Discharge: 2016-05-25 | Disposition: A | Discharge status: Home / Self Care | Payer: 59 | Attending: Emergency Medicine | Admitting: Emergency Medicine

## 2016-05-25 ENCOUNTER — Encounter (HOSPITAL_COMMUNITY): Payer: Self-pay | Admitting: Emergency Medicine

## 2016-05-25 DIAGNOSIS — Z91040 Latex allergy status: Secondary | ICD-10-CM | POA: Insufficient documentation

## 2016-05-25 DIAGNOSIS — Z87891 Personal history of nicotine dependence: Secondary | ICD-10-CM | POA: Insufficient documentation

## 2016-05-25 DIAGNOSIS — H6692 Otitis media, unspecified, left ear: Secondary | ICD-10-CM | POA: Insufficient documentation

## 2016-05-25 DIAGNOSIS — H6690 Otitis media, unspecified, unspecified ear: Secondary | ICD-10-CM

## 2016-05-25 DIAGNOSIS — H9202 Otalgia, left ear: Secondary | ICD-10-CM | POA: Diagnosis present

## 2016-05-25 MED ORDER — BENZONATATE 100 MG PO CAPS: 100.0000 mg | ORAL_CAPSULE | Freq: Three times a day (TID) | ORAL | 0 refills | Status: DC

## 2016-05-25 MED ORDER — AMOXICILLIN 500 MG PO CAPS: 1000.0000 mg | ORAL_CAPSULE | Freq: Two times a day (BID) | ORAL | 0 refills | Status: DC

## 2016-05-25 NOTE — ED Notes (Signed)
C/O LEFT EAR PAIN, "IT'S AFFECTING MY EQUILIBRIUM", X 3 DAYS. ALSO C/O COUGH, CLEAR TO GREEN SPUTUM.

## 2016-05-25 NOTE — ED Provider Notes (Signed)
[facility]-EMERGENCY DEPT Provider Note   By signing my name below, I, Earmon PhoenixJennifer Waddell, attest that this documentation has been prepared under the direction and in the presence of AvayaSamantha Antwone Capozzoli, PA-C. Electronically Signed: Earmon PhoenixJennifer Waddell, ED Scribe. 05/25/16. 11:21 AM.    History   Chief Complaint Chief Complaint  Patient presents with  . Otalgia  . Cough    The history is provided by the patient and medical records. No language interpreter was used.    HPI Comments:  Latoya Palmer is a 30 y.o. female who presents to the Emergency Department complaining of left ear pain that began three days ago. She reports some associated swelling of the outer left ear. She reports an associated productive cough that has been ongoing since she was diagnosed with a URI about two weeks ago. Pt denies any sick contacts. She has not taken anything for the current symptoms. Lying on her left side increases her ear pain. She denies alleviating factors. She denies fever, chills, nausea, vomiting, sore throat. She states she quit smoking a couple months ago and smokes cigars occasionally. She denies allergies to any medications.   Past Medical History:  Diagnosis Date  . Abnormal Pap smear   . Anemia   . Bacterial vaginosis   . Headache(784.0)   . Heart murmur   . Ovarian cyst   . Preterm labor   . Vaginal Pap smear, abnormal     Patient Active Problem List   Diagnosis Date Noted  . Miscarriage 09/17/2013  . BV (bacterial vaginosis) 08/28/2011  . Pap smear of cervix with ASCUS, cannot exclude HGSIL 08/28/2011    Past Surgical History:  Procedure Laterality Date  . COLPOSCOPY    . MYRINGOTOMY    . THERAPEUTIC ABORTION     two    OB History    Gravida Para Term Preterm AB Living   7 1 1  0 6 1   SAB TAB Ectopic Multiple Live Births   3 3 0 0 1       Home Medications    Prior to Admission medications   Medication Sig Start Date End Date Taking? Authorizing Provider    clindamycin (CLEOCIN) 300 MG capsule Take 1 capsule (300 mg total) by mouth 2 (two) times daily. 10/04/15   Judeth HornErin Lawrence, NP  levonorgestrel (PLAN B) 0.75 MG tablet Take 1 tablet (0.75 mg total) by mouth every 12 (twelve) hours. 04/03/16   Willodean Rosenthalarolyn Harraway-Smith, MD  metroNIDAZOLE (FLAGYL) 500 MG tablet Take 1 tablet (500 mg total) by mouth 2 (two) times daily. 03/07/16   Tereso NewcomerUgonna A Anyanwu, MD  norgestimate-ethinyl estradiol (SPRINTEC 28) 0.25-35 MG-MCG tablet Take 1 tablet by mouth daily. 10/04/15   Judeth HornErin Lawrence, NP    Family History No family history on file.  Social History Social History  Substance Use Topics  . Smoking status: Former Smoker    Packs/day: 0.50    Years: 4.00    Types: Cigarettes    Quit date: 05/12/2013  . Smokeless tobacco: Never Used  . Alcohol use No     Comment: twice a month, social     Allergies   Latex   Review of Systems Review of Systems A complete 10 system review of systems was obtained and all systems are negative except as noted in the HPI and PMH.    Physical Exam Updated Vital Signs BP 111/82 (BP Location: Left Arm)   Pulse 68   Temp 98.1 F (36.7 C) (Oral)   Resp  16   Ht 5\' 5"  (1.651 m)   Wt 130 lb (59 kg)   LMP 05/25/2016   SpO2 98%   BMI 21.63 kg/m   Physical Exam  Constitutional: She is oriented to person, place, and time. She appears well-developed and well-nourished. No distress.  HENT:  Head: Normocephalic and atraumatic.  Right Ear: Tympanic membrane and ear canal normal.  Left Ear: There is tenderness. Tympanic membrane is erythematous. A middle ear effusion is present.  Mouth/Throat: No oropharyngeal exudate.  Eyes: Conjunctivae and EOM are normal. Pupils are equal, round, and reactive to light. Right eye exhibits no discharge. Left eye exhibits no discharge. No scleral icterus.  Cardiovascular: Normal rate, regular rhythm, normal heart sounds and intact distal pulses.  Exam reveals no gallop and no friction rub.   No  murmur heard. Pulmonary/Chest: Effort normal and breath sounds normal. No respiratory distress. She has no wheezes. She has no rales. She exhibits no tenderness.  Musculoskeletal: Normal range of motion. She exhibits no edema.  Neurological: She is alert and oriented to person, place, and time.  Skin: Skin is warm and dry. No rash noted. She is not diaphoretic. No erythema. No pallor.  Psychiatric: She has a normal mood and affect. Her behavior is normal.  Nursing note and vitals reviewed.    ED Treatments / Results  DIAGNOSTIC STUDIES: Oxygen Saturation is 98% on RA, normal by my interpretation.   COORDINATION OF CARE: 11:08 AM- Will prescribe antibiotics for ear infection. Pt verbalizes understanding and agrees to plan.  Medications - No data to display   Labs (all labs ordered are listed, but only abnormal results are displayed) Labs Reviewed - No data to display  EKG  EKG Interpretation None       Radiology No results found.  Procedures Procedures (including critical care time)  Medications Ordered in ED Medications - No data to display   Initial Impression / Assessment and Plan / ED Course  I have reviewed the triage vital signs and the nursing notes.  Pertinent labs & imaging results that were available during my care of the patient were reviewed by me and considered in my medical decision making (see chart for details).  Clinical Course    Patient presents with otalgia and exam consistent with acute otitis media. No concern for acute mastoiditis, meningitis.  Patient discharged home with Amoxicillin.  Advised patient to follow-up with PCP.  I have also discussed reasons to return immediately to the ER.  Patient expresses understanding and agrees with plan. Pt appears safe for discharge.   I personally performed the services described in this documentation, which was scribed in my presence. The recorded information has been reviewed and is accurate.  Final  Clinical Impressions(s) / ED Diagnoses   Final diagnoses:  Acute otitis media, unspecified otitis media type    New Prescriptions Discharge Medication List as of 05/25/2016 11:25 AM    START taking these medications   Details  amoxicillin (AMOXIL) 500 MG capsule Take 2 capsules (1,000 mg total) by mouth 2 (two) times daily., Starting Sat 05/25/2016, Print    benzonatate (TESSALON) 100 MG capsule Take 1 capsule (100 mg total) by mouth every 8 (eight) hours., Starting Sat 05/25/2016, Print         Lester KinsmanSamantha Tripp HooppoleDowless, PA-C 05/25/16 1151    Maia PlanJoshua G Long, MD 05/25/16 [phone]

## 2016-05-25 NOTE — ED Triage Notes (Signed)
Pt. Stated, I've had this ear pain for the last 3 days and a basd cough.

## 2016-05-25 NOTE — Discharge Instructions (Signed)
Take antibiotics as prescribed. Take cough medication as needed. Drink plenty of fluids, take Tylenol as needed for body aches. Follow up with her primary care doctor. Return to the ED if you experience difficulty breathing, chest pain, increased fevers.

## 2016-06-05 ENCOUNTER — Telehealth: Payer: Self-pay | Admitting: *Deleted

## 2016-06-05 MED ORDER — LEVONORGESTREL 1.5 MG PO TABS
1.5000 mg | ORAL_TABLET | Freq: Once | ORAL | 0 refills | Status: AC
Start: 2016-06-05 — End: 2016-06-05

## 2016-06-05 MED ORDER — LEVONORGESTREL 1.5 MG PO TABS: 1.5000 mg | ORAL_TABLET | Freq: Once | ORAL | 0 refills | Status: DC

## 2016-06-05 NOTE — Telephone Encounter (Signed)
Pt left message requesting Plan B RX due to failed contraception yesterday. Rx e-prescribed as requested. I called pt and notified her that the prescription has been sent. I also mentioned the fact that we had sent Rx for the same medication in September.  Pt stated that she is allergic to latex so "the condom keeps breaking". I informed her that there are non-latex condoms available at any pharmacy. I also asked if she would like to have appt to discuss other birth control options. She stated yes and agreed to be called with appt with any provider. She stated that a message can be left on her voice mail with appt details.

## 2016-06-05 NOTE — Addendum Note (Signed)
Addended by: Jill SideAY, DIANE L on: 06/05/2016 02:24 PM   Modules accepted: Orders

## 2016-06-27 ENCOUNTER — Ambulatory Visit: Payer: 59 | Admitting: Obstetrics and Gynecology

## 2016-07-04 ENCOUNTER — Telehealth: Payer: Self-pay | Admitting: *Deleted

## 2016-07-04 NOTE — Telephone Encounter (Signed)
Patient phoned to MAU and spoke with Wynelle BourgeoisMarie Williams, CNM.  Hilda LiasMarie communicates the following regarding patient call.  States she has left multiple messages and no one has returned her calls.  States she has BV and wants a prescription not for flagyl but for pentasol (question spelling).  Patient states if someone doesn't call her back today she will go to MAU and the hospital won't get paid.   Will forward message to clinical pool.

## 2016-07-05 ENCOUNTER — Other Ambulatory Visit: Payer: Self-pay | Admitting: Advanced Practice Midwife

## 2016-07-05 ENCOUNTER — Telehealth (HOSPITAL_COMMUNITY): Payer: Self-pay

## 2016-07-05 MED ORDER — TINIDAZOLE 500 MG PO TABS: 2.0000 g | ORAL_TABLET | Freq: Every day | ORAL | 2 refills | Status: DC

## 2016-07-05 NOTE — Progress Notes (Signed)
Patient called MAU requesting Rx for Tindazole  No one called her yesterday   States has recurrent BV.  Flagyl does not work  Since she has not received call back from clinic, will send Rx for tindazole today  Warned if sx not improved needs to be seen and swabbed  Discussed if Flagyl does not work this might not work either  Aviva SignsMarie L Madeleyn Schwimmer, CNM

## 2016-07-19 ENCOUNTER — Encounter: Payer: Self-pay | Admitting: Obstetrics & Gynecology

## 2016-07-19 ENCOUNTER — Ambulatory Visit (INDEPENDENT_AMBULATORY_CARE_PROVIDER_SITE_OTHER): Payer: 59 | Admitting: Obstetrics & Gynecology

## 2016-07-19 VITALS — BP 118/77 | HR 60 | Ht 65.0 in | Wt 135.5 lb

## 2016-07-19 DIAGNOSIS — Z113 Encounter for screening for infections with a predominantly sexual mode of transmission: Secondary | ICD-10-CM

## 2016-07-19 DIAGNOSIS — Z0000 Encounter for general adult medical examination without abnormal findings: Secondary | ICD-10-CM | POA: Diagnosis not present

## 2016-07-19 DIAGNOSIS — R87613 High grade squamous intraepithelial lesion on cytologic smear of cervix (HGSIL): Secondary | ICD-10-CM | POA: Insufficient documentation

## 2016-07-19 DIAGNOSIS — Z760 Encounter for issue of repeat prescription: Secondary | ICD-10-CM | POA: Diagnosis not present

## 2016-07-19 DIAGNOSIS — Z3041 Encounter for surveillance of contraceptive pills: Secondary | ICD-10-CM

## 2016-07-19 DIAGNOSIS — Z01419 Encounter for gynecological examination (general) (routine) without abnormal findings: Secondary | ICD-10-CM | POA: Diagnosis not present

## 2016-07-19 DIAGNOSIS — Z1151 Encounter for screening for human papillomavirus (HPV): Secondary | ICD-10-CM | POA: Diagnosis not present

## 2016-07-19 DIAGNOSIS — Z124 Encounter for screening for malignant neoplasm of cervix: Secondary | ICD-10-CM | POA: Diagnosis not present

## 2016-07-19 DIAGNOSIS — N760 Acute vaginitis: Secondary | ICD-10-CM

## 2016-07-19 HISTORY — DX: High grade squamous intraepithelial lesion on cytologic smear of cervix (HGSIL): R87.613

## 2016-07-19 LAB — HEPATITIS B SURFACE ANTIGEN: Hepatitis B Surface Ag: NEGATIVE

## 2016-07-19 LAB — HEPATITIS C ANTIBODY: HCV Ab: NEGATIVE

## 2016-07-19 MED ORDER — METRONIDAZOLE 500 MG PO TABS: 500.0000 mg | ORAL_TABLET | Freq: Two times a day (BID) | ORAL | 6 refills | Status: DC

## 2016-07-19 MED ORDER — LEVONORGEST-ETH ESTRAD 91-DAY 0.15-0.03 &0.01 MG PO TABS: 1.0000 | ORAL_TABLET | Freq: Every day | ORAL | 4 refills | Status: DC

## 2016-07-19 MED ORDER — METRONIDAZOLE 500 MG PO TABS: 500.0000 mg | ORAL_TABLET | Freq: Two times a day (BID) | ORAL | 0 refills | Status: DC

## 2016-07-19 NOTE — Progress Notes (Addendum)
GYNECOLOGY ANNUAL PREVENTATIVE CARE ENCOUNTER NOTE  Subjective:   Latoya Palmer is a 30 y.o. SAA P1 59(31 yo daughter) [phone] female here for a routine annual gynecologic exam.  Current complaints: Interested in contraception- would like Seasonique.   Denies abnormal vaginal bleeding, discharge, pelvic pain, problems with intercourse or other gynecologic concerns.    Gynecologic History Patient's last menstrual period was 06/22/2016 (exact date). Contraception: condoms Last Pap: 09/17/2013. Results were: abnormal with ASCUS cannot rule out HGSIL, patient did not follow up for colposcopy.  Obstetric History OB History  Gravida Para Term Preterm AB Living  7 1 1  0 6 1  SAB TAB Ectopic Multiple Live Births  3 3 0 0 1    # Outcome Date GA Lbr Len/2nd Weight Sex Delivery Anes PTL Lv  7 SAB 08/26/13             Birth Comments: System Generated. Please review and update pregnancy details.  6 Term 09/26/06    F Vag-Spont EPI Y LIV  5 TAB           4 TAB           3 TAB           2 SAB           1 SAB               Past Medical History:  Diagnosis Date  . Abnormal Pap smear   . Anemia   . Bacterial vaginosis   . Headache(784.0)   . Heart murmur   . Ovarian cyst   . Preterm labor   . Vaginal Pap smear, abnormal     Past Surgical History:  Procedure Laterality Date  . COLPOSCOPY    . MYRINGOTOMY    . THERAPEUTIC ABORTION     two    No current outpatient prescriptions on file prior to visit.   No current facility-administered medications on file prior to visit.     Allergies  Allergen Reactions  . Latex Swelling and Other (See Comments)    Causes irritation    Social History   Social History  . Marital status: Single    Spouse name: N/A  . Number of children: N/A  . Years of education: N/A   Occupational History  . Not on file.   Social History Main Topics  . Smoking status: Current Some Day Smoker    Packs/day: 0.50    Years: 4.00    Types:  Cigarettes    Last attempt to quit: 05/12/2013  . Smokeless tobacco: Never Used  . Alcohol use No     Comment: twice a month, social  . Drug use: No  . Sexual activity: Yes    Birth control/ protection: None   Other Topics Concern  . Not on file   Social History Narrative  . No narrative on file    No family history on file.  The following portions of the patient's history were reviewed and updated as appropriate: allergies, current medications, past family history, past medical history, past social history, past surgical history and problem list.  Review of Systems Pertinent items are noted in HPI.  Monogamous for 5 years Works at [employer] Declines flu vaccine FH- no breast/gyn/colon cancer She has a FP that does her routine lab work   Objective:  BP 118/77   Pulse 60   Ht 5\' 5"  (1.651 m)   Wt 135 lb 8 oz (  61.5 kg)   LMP 06/22/2016 (Exact Date)   Breastfeeding? No   BMI 22.55 kg/m  CONSTITUTIONAL: Well-developed, well-nourished female in no acute distress.  HENT:  Normocephalic, atraumatic, External right and left ear normal. Oropharynx is clear and moist EYES: Conjunctivae and EOM are normal. Pupils are equal, round, and reactive to light. No scleral icterus.  NECK: Normal range of motion, supple, no masses.  Normal thyroid.  SKIN: Skin is warm and dry. No rash noted. Not diaphoretic. No erythema. No pallor. NEUROLOGIC: Alert and oriented to person, place, and time. Normal reflexes, muscle tone coordination. No cranial nerve deficit noted. PSYCHIATRIC: Normal mood and affect. Normal behavior. Normal judgment and thought content. CARDIOVASCULAR: Normal heart rate noted, regular rhythm RESPIRATORY: Clear to auscultation bilaterally. Effort and breath sounds normal, no problems with respiration noted. BREASTS: Symmetric in size. No masses, skin changes, nipple drainage, or lymphadenopathy. ABDOMEN: Soft, normal bowel sounds, no distention noted.  No tenderness,  rebound or guarding.  PELVIC: Normal appearing external genitalia; normal appearing vaginal mucosa and cervix.  No abnormal discharge noted.  Pap smear obtained.  Normal uterine size, no other palpable masses, no uterine or adnexal tenderness. MUSCULOSKELETAL: Normal range of motion. No tenderness.  No cyanosis, clubbing, or edema.  2+ distal pulses. + vaginal discharge c/w BV  Assessment:  Annual gynecologic examination with pap smear Contraception Recurrent BV   Plan:  Will follow up results of pap smear and manage accordingly. STI screening per patient request  Seasonique prescribed Refilled Flagyl and reccommended boric acid suppositories daily Routine preventative health maintenance measures emphasized. Please refer to After Visit Summary for other counseling recommendations.   Nicholaus BloomMYRA Carsten Carstarphen, MD, Evern CoreFACOG and  Jaynie CollinsUGONNA  ANYANWU, MD, FACOG Attending Obstetrician & Gynecologist, [facility]  Of note, Dr. Marice Potterove had to complete this encounter for Dr. Macon LargeAnyanwu, who was called away for an emergency.

## 2016-07-19 NOTE — Patient Instructions (Signed)
 Preventive Care 18-39 Years, Female Preventive care refers to lifestyle choices and visits with your health care provider that can promote health and wellness. What does preventive care include?  A yearly physical exam. This is also called an annual well check.  Dental exams once or twice a year.  Routine eye exams. Ask your health care provider how often you should have your eyes checked.  Personal lifestyle choices, including: ? Daily care of your teeth and gums. ? Regular physical activity. ? Eating a healthy diet. ? Avoiding tobacco and drug use. ? Limiting alcohol use. ? Practicing safe sex. ? Taking vitamin and mineral supplements as recommended by your health care provider. What happens during an annual well check? The services and screenings done by your health care provider during your annual well check will depend on your age, overall health, lifestyle risk factors, and family history of disease. Counseling Your health care provider may ask you questions about your:  Alcohol use.  Tobacco use.  Drug use.  Emotional well-being.  Home and relationship well-being.  Sexual activity.  Eating habits.  Work and work environment.  Method of birth control.  Menstrual cycle.  Pregnancy history.  Screening You may have the following tests or measurements:  Height, weight, and BMI.  Diabetes screening. This is done by checking your blood sugar (glucose) after you have not eaten for a while (fasting).  Blood pressure.  Lipid and cholesterol levels. These may be checked every 5 years starting at age 20.  Skin check.  Hepatitis C blood test.  Hepatitis B blood test.  Sexually transmitted disease (STD) testing.  BRCA-related cancer screening. This may be done if you have a family history of breast, ovarian, tubal, or peritoneal cancers.  Pelvic exam and Pap test. This may be done every 3 years starting at age 21. Starting at age 30, this may be done  every 5 years if you have a Pap test in combination with an HPV test.  Discuss your test results, treatment options, and if necessary, the need for more tests with your health care provider. Vaccines Your health care provider may recommend certain vaccines, such as:  Influenza vaccine. This is recommended every year.  Tetanus, diphtheria, and acellular pertussis (Tdap, Td) vaccine. You may need a Td booster every 10 years.  Varicella vaccine. You may need this if you have not been vaccinated.  HPV vaccine. If you are 26 or younger, you may need three doses over 6 months.  Measles, mumps, and rubella (MMR) vaccine. You may need at least one dose of MMR. You may also need a second dose.  Pneumococcal 13-valent conjugate (PCV13) vaccine. You may need this if you have certain conditions and were not previously vaccinated.  Pneumococcal polysaccharide (PPSV23) vaccine. You may need one or two doses if you smoke cigarettes or if you have certain conditions.  Meningococcal vaccine. One dose is recommended if you are age 19-21 years and a first-year college student living in a residence hall, or if you have one of several medical conditions. You may also need additional booster doses.  Hepatitis A vaccine. You may need this if you have certain conditions or if you travel or work in places where you may be exposed to hepatitis A.  Hepatitis B vaccine. You may need this if you have certain conditions or if you travel or work in places where you may be exposed to hepatitis B.  Haemophilus influenzae type b (Hib) vaccine. You may need this   if you have certain risk factors.  Talk to your health care provider about which screenings and vaccines you need and how often you need them. This information is not intended to replace advice given to you by your health care provider. Make sure you discuss any questions you have with your health care provider. Document Released: 09/10/2001 Document Revised:  04/03/2016 Document Reviewed: 05/16/2015 Elsevier Interactive Patient Education  2017 Elsevier Inc.  

## 2016-07-20 LAB — HIV ANTIBODY (ROUTINE TESTING W REFLEX): HIV 1&2 Ab, 4th Generation: NONREACTIVE

## 2016-07-20 LAB — RPR

## 2016-07-24 LAB — POCT PREGNANCY, URINE: Preg Test, Ur: NEGATIVE

## 2016-07-24 LAB — CYTOLOGY - PAP
Adequacy: ABSENT — AB
Chlamydia: NEGATIVE
DIAGNOSIS: HIGH — AB
HPV: DETECTED — AB
Neisseria Gonorrhea: NEGATIVE
Trichomonas: NEGATIVE

## 2016-07-26 ENCOUNTER — Encounter: Payer: Self-pay | Admitting: Obstetrics & Gynecology

## 2016-07-29 DIAGNOSIS — J189 Pneumonia, unspecified organism: Secondary | ICD-10-CM

## 2016-07-29 HISTORY — DX: Pneumonia, unspecified organism: J18.9

## 2016-08-01 ENCOUNTER — Telehealth: Payer: Self-pay

## 2016-08-01 NOTE — Telephone Encounter (Signed)
Per Dr. 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  Colpo scheduled for 08/09/16 @ 0940.  Notified pt of results and appt.  Pt stated understanding with no further questions.

## 2016-08-09 ENCOUNTER — Encounter: Payer: 59 | Admitting: Obstetrics & Gynecology

## 2016-10-11 ENCOUNTER — Other Ambulatory Visit: Payer: Self-pay | Admitting: Obstetrics & Gynecology

## 2016-10-14 ENCOUNTER — Telehealth: Payer: Self-pay | Admitting: *Deleted

## 2016-10-14 NOTE — Telephone Encounter (Signed)
Latoya Palmer left a message this am stating she is calling re: her appt in January. States she had a pap and a check up and due to her getting BV every month was advised would have refills of her med. States she went to pharmacy this weekend and there wasn't any refills. States was supposed to have refills so she wouldn't have to keep coming to have hospital and get bills she can't pay.  Wants to know if she can get her refills or should she come to ER?  Per chart review was seen by Dr. Marice Potterove 07/19/16 and prescribed flagyl with 6 refills.  Need to call patient and find out which pharmacy she went to and make sure pharmacy has refills.    I called Latoya Palmer and left a message I was returning her call- please call the office back so we can discuss her concern so we can take care of it. Also needs to have colpo rescheduled- looks like she DNKA.

## 2016-10-15 ENCOUNTER — Telehealth: Payer: Self-pay | Admitting: Medical

## 2016-10-15 DIAGNOSIS — B9689 Other specified bacterial agents as the cause of diseases classified elsewhere: Secondary | ICD-10-CM

## 2016-10-15 DIAGNOSIS — N760 Acute vaginitis: Principal | ICD-10-CM

## 2016-10-15 MED ORDER — METRONIDAZOLE 500 MG PO TABS: 500.0000 mg | ORAL_TABLET | Freq: Two times a day (BID) | ORAL | 4 refills | Status: DC

## 2016-10-15 NOTE — Telephone Encounter (Signed)
Patient called [facility] stating that she was told that the clinic staff couldn't help her and that she needed to talk to a nurse in the hospital to get a refill for her recurrent BV. She was given Flagyl with 6 refills by Dr. Marice Potterove in December and [facility] that she does not have refills available. 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 The patient states that she can't miss work for the Colposcopy. 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 She plans to keep the appointment on 3/29 at 8:40 am. After speaking with the patient I was able to [facility] and they stated that the Rx they received did not have any refills even though the electronic order in Epic states 6 refills. The patient has only had 1 Rx for Flagyl since  December. I have sent in another Rx for Flagyl with refills. Patient is aware.   Marny LowensteinJulie N Hashem Goynes, PA-C 10/15/2016 4:21 PM

## 2016-10-16 NOTE — Telephone Encounter (Signed)
I called Morrie Sheldonshley again and left a message I am returning your call- please call our office back as we are trying to meet your need. I called her pharmacy and they verified a new order was sent in yesterday and has 4 refills and Morrie Sheldonshley has picked up a refill.

## 2016-10-24 ENCOUNTER — Ambulatory Visit (INDEPENDENT_AMBULATORY_CARE_PROVIDER_SITE_OTHER): Payer: 59 | Admitting: Obstetrics & Gynecology

## 2016-10-24 ENCOUNTER — Encounter: Payer: Self-pay | Admitting: Obstetrics & Gynecology

## 2016-10-24 ENCOUNTER — Other Ambulatory Visit (HOSPITAL_COMMUNITY)
Admission: RE | Admit: 2016-10-24 | Discharge: 2016-10-24 | Disposition: A | Discharge status: Home / Self Care | Payer: 59 | Source: Ambulatory Visit | Attending: Obstetrics & Gynecology | Admitting: Obstetrics & Gynecology

## 2016-10-24 VITALS — BP 117/70 | HR 78 | Wt 135.1 lb

## 2016-10-24 DIAGNOSIS — R87613 High grade squamous intraepithelial lesion on cytologic smear of cervix (HGSIL): Secondary | ICD-10-CM | POA: Diagnosis present

## 2016-10-24 LAB — POCT PREGNANCY, URINE: PREG TEST UR: NEGATIVE

## 2016-10-24 NOTE — Progress Notes (Signed)
   Subjective:    Patient ID: Carole Civilshley S Grape, female    DOB: 15-Jul-1986, 31 y.o.   MRN: [id number]  HPI  31 yo SAA P1 32(32 yo son) here for a colpo. 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 In 2015 she had a ASCUS, cannot rule out HGSIL.  Review of Systems She has been abstinent since 11/17.    Objective:   Physical Exam Pleasant WNWHBFNAD Breathing, conversing, and ambulating normally UPT negative, consent signed, time out done Cervix prepped with acetic acid. Transformation zone seen in its entirety. Colpo adequate. 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 She tolerated the procedure well.      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

## 2016-10-30 ENCOUNTER — Encounter (HOSPITAL_COMMUNITY): Payer: Self-pay | Admitting: *Deleted

## 2016-10-30 ENCOUNTER — Encounter (HOSPITAL_COMMUNITY): Payer: Self-pay

## 2016-11-05 ENCOUNTER — Encounter (HOSPITAL_COMMUNITY): Payer: Self-pay | Admitting: *Deleted

## 2016-11-05 ENCOUNTER — Encounter (HOSPITAL_COMMUNITY)
Admission: RE | Disposition: A | Discharge status: Home / Self Care | Payer: Self-pay | Source: Ambulatory Visit | Attending: Obstetrics & Gynecology

## 2016-11-05 ENCOUNTER — Ambulatory Visit (HOSPITAL_COMMUNITY): Payer: 59 | Admitting: Anesthesiology

## 2016-11-05 ENCOUNTER — Ambulatory Visit (HOSPITAL_COMMUNITY)
Admission: RE | Admit: 2016-11-05 | Discharge: 2016-11-05 | Disposition: A | Discharge status: Home / Self Care | Payer: 59 | Source: Ambulatory Visit | Attending: Obstetrics & Gynecology | Admitting: Obstetrics & Gynecology

## 2016-11-05 DIAGNOSIS — N83209 Unspecified ovarian cyst, unspecified side: Secondary | ICD-10-CM | POA: Insufficient documentation

## 2016-11-05 DIAGNOSIS — Z79899 Other long term (current) drug therapy: Secondary | ICD-10-CM | POA: Diagnosis not present

## 2016-11-05 DIAGNOSIS — R011 Cardiac murmur, unspecified: Secondary | ICD-10-CM | POA: Diagnosis not present

## 2016-11-05 DIAGNOSIS — F17210 Nicotine dependence, cigarettes, uncomplicated: Secondary | ICD-10-CM | POA: Diagnosis not present

## 2016-11-05 DIAGNOSIS — N760 Acute vaginitis: Secondary | ICD-10-CM | POA: Insufficient documentation

## 2016-11-05 DIAGNOSIS — R51 Headache: Secondary | ICD-10-CM | POA: Diagnosis not present

## 2016-11-05 DIAGNOSIS — D649 Anemia, unspecified: Secondary | ICD-10-CM | POA: Diagnosis not present

## 2016-11-05 DIAGNOSIS — Z91040 Latex allergy status: Secondary | ICD-10-CM | POA: Insufficient documentation

## 2016-11-05 DIAGNOSIS — D067 Carcinoma in situ of other parts of cervix: Secondary | ICD-10-CM | POA: Diagnosis not present

## 2016-11-05 DIAGNOSIS — R87613 High grade squamous intraepithelial lesion on cytologic smear of cervix (HGSIL): Secondary | ICD-10-CM | POA: Diagnosis present

## 2016-11-05 HISTORY — PX: CERVICAL CONIZATION W/BX: SHX1330

## 2016-11-05 LAB — PREGNANCY, URINE: Preg Test, Ur: NEGATIVE

## 2016-11-05 SURGERY — CONE BIOPSY, CERVIX
Anesthesia: General | Site: Cervix

## 2016-11-05 MED ORDER — KETOROLAC TROMETHAMINE 30 MG/ML IJ SOLN
INTRAMUSCULAR | Status: DC | PRN
  Administered 2016-11-05: 30 mg via INTRAVENOUS

## 2016-11-05 MED ORDER — FENTANYL CITRATE (PF) 100 MCG/2ML IJ SOLN
INTRAMUSCULAR | Status: AC
Start: 2016-11-05 — End: 2016-11-05
  Filled 2016-11-05: qty 2

## 2016-11-05 MED ORDER — BUPIVACAINE HCL (PF) 0.5 % IJ SOLN
INTRAMUSCULAR | Status: DC | PRN
  Administered 2016-11-05: 20 mL

## 2016-11-05 MED ORDER — IODINE STRONG (LUGOLS) 5 % PO SOLN
ORAL | Status: AC
  Filled 2016-11-05: qty 1

## 2016-11-05 MED ORDER — ONDANSETRON HCL 4 MG/2ML IJ SOLN
INTRAMUSCULAR | Status: DC | PRN
  Administered 2016-11-05: 4 mg via INTRAVENOUS

## 2016-11-05 MED ORDER — OXYCODONE-ACETAMINOPHEN 5-325 MG PO TABS: 1.0000 | ORAL_TABLET | Freq: Four times a day (QID) | ORAL | 0 refills | Status: DC | PRN

## 2016-11-05 MED ORDER — PROPOFOL 10 MG/ML IV BOLUS
INTRAVENOUS | Status: AC
  Filled 2016-11-05: qty 20

## 2016-11-05 MED ORDER — KETOROLAC TROMETHAMINE 30 MG/ML IJ SOLN
INTRAMUSCULAR | Status: AC
  Filled 2016-11-05: qty 1

## 2016-11-05 MED ORDER — BUPIVACAINE HCL (PF) 0.5 % IJ SOLN
INTRAMUSCULAR | Status: AC
  Filled 2016-11-05: qty 30

## 2016-11-05 MED ORDER — LIDOCAINE HCL (CARDIAC) 20 MG/ML IV SOLN
INTRAVENOUS | Status: AC
  Filled 2016-11-05: qty 5

## 2016-11-05 MED ORDER — SCOPOLAMINE 1 MG/3DAYS TD PT72
MEDICATED_PATCH | TRANSDERMAL | Status: AC
  Administered 2016-11-05: 1.5 mg via TRANSDERMAL
  Filled 2016-11-05: qty 1

## 2016-11-05 MED ORDER — SCOPOLAMINE 1 MG/3DAYS TD PT72
1.0000 | MEDICATED_PATCH | Freq: Once | TRANSDERMAL | Status: DC
  Administered 2016-11-05: 1.5 mg via TRANSDERMAL

## 2016-11-05 MED ORDER — SILVER NITRATE-POT NITRATE 75-25 % EX MISC
CUTANEOUS | Status: DC | PRN
  Administered 2016-11-05: 2

## 2016-11-05 MED ORDER — ACETIC ACID 5 % SOLN
Status: AC
  Filled 2016-11-05: qty 500

## 2016-11-05 MED ORDER — IBUPROFEN 600 MG PO TABS: 600.0000 mg | ORAL_TABLET | Freq: Four times a day (QID) | ORAL | 1 refills | Status: DC | PRN

## 2016-11-05 MED ORDER — ACETIC ACID 4% SOLUTION
Status: DC | PRN
  Administered 2016-11-05: 1 via TOPICAL

## 2016-11-05 MED ORDER — DEXAMETHASONE SODIUM PHOSPHATE 4 MG/ML IJ SOLN
INTRAMUSCULAR | Status: AC
  Filled 2016-11-05: qty 1

## 2016-11-05 MED ORDER — LACTATED RINGERS IV SOLN
INTRAVENOUS | Status: DC
  Administered 2016-11-05: 10:00:00 via INTRAVENOUS

## 2016-11-05 MED ORDER — ONDANSETRON HCL 4 MG/2ML IJ SOLN
INTRAMUSCULAR | Status: AC
  Filled 2016-11-05: qty 2

## 2016-11-05 MED ORDER — MIDAZOLAM HCL 2 MG/2ML IJ SOLN
INTRAMUSCULAR | Status: AC
  Filled 2016-11-05: qty 2

## 2016-11-05 MED ORDER — DEXAMETHASONE SODIUM PHOSPHATE 10 MG/ML IJ SOLN
INTRAMUSCULAR | Status: DC | PRN
  Administered 2016-11-05: 4 mg via INTRAVENOUS

## 2016-11-05 MED ORDER — FERRIC SUBSULFATE 259 MG/GM EX SOLN
CUTANEOUS | Status: AC
  Filled 2016-11-05: qty 8

## 2016-11-05 MED ORDER — PROPOFOL 10 MG/ML IV BOLUS
INTRAVENOUS | Status: DC | PRN
  Administered 2016-11-05: 200 mg via INTRAVENOUS

## 2016-11-05 MED ORDER — FENTANYL CITRATE (PF) 100 MCG/2ML IJ SOLN
INTRAMUSCULAR | Status: DC | PRN
  Administered 2016-11-05: 100 ug via INTRAVENOUS

## 2016-11-05 MED ORDER — MIDAZOLAM HCL 2 MG/2ML IJ SOLN
INTRAMUSCULAR | Status: DC | PRN
  Administered 2016-11-05: 2 mg via INTRAVENOUS

## 2016-11-05 SURGICAL SUPPLY — 31 items
BLADE SURG 11 STRL SS (BLADE) ×2 IMPLANT
CATH FOLEY LATEX FREE 12 FR (CATHETERS) ×2
CATH FOLEY LF 12 FR (CATHETERS) IMPLANT
CLOTH BEACON ORANGE TIMEOUT ST (SAFETY) ×2 IMPLANT
CONTAINER PREFILL 10% NBF 60ML (FORM) ×4 IMPLANT
COUNTER NEEDLE 1200 MAGNETIC (NEEDLE) ×2 IMPLANT
DILATOR CANAL MILEX (MISCELLANEOUS) IMPLANT
ELECT REM PT RETURN 9FT ADLT (ELECTROSURGICAL) ×2
ELECTRODE REM PT RTRN 9FT ADLT (ELECTROSURGICAL) ×1 IMPLANT
GLOVE BIO SURGEON STRL SZ 6.5 (GLOVE) ×2 IMPLANT
GLOVE BIOGEL PI IND STRL 6 (GLOVE) ×1 IMPLANT
GLOVE BIOGEL PI IND STRL 6.5 (GLOVE) IMPLANT
GLOVE BIOGEL PI IND STRL 7.0 (GLOVE) ×1 IMPLANT
GLOVE BIOGEL PI INDICATOR 6 (GLOVE) ×1
GLOVE BIOGEL PI INDICATOR 6.5 (GLOVE) ×1
GLOVE BIOGEL PI INDICATOR 7.0 (GLOVE) ×3
GOWN STRL REUS W/TWL LRG LVL3 (GOWN DISPOSABLE) ×4 IMPLANT
NDL SPNL 18GX3.5 QUINCKE PK (NEEDLE) ×1 IMPLANT
NEEDLE SPNL 18GX3.5 QUINCKE PK (NEEDLE) ×2 IMPLANT
PACK VAGINAL MINOR WOMEN LF (CUSTOM PROCEDURE TRAY) ×2 IMPLANT
PAD OB MATERNITY 4.3X12.25 (PERSONAL CARE ITEMS) ×2 IMPLANT
PENCIL BUTTON HOLSTER BLD 10FT (ELECTRODE) ×2 IMPLANT
SCOPETTES 8  STERILE (MISCELLANEOUS) ×1
SCOPETTES 8 STERILE (MISCELLANEOUS) ×1 IMPLANT
SPONGE SURGIFOAM ABS GEL 12-7 (HEMOSTASIS) ×1 IMPLANT
SUT CHROMIC 0 CT 1 (SUTURE) IMPLANT
SUT VIC AB 0 CT1 27 (SUTURE) ×4
SUT VIC AB 0 CT1 27XBRD ANBCTR (SUTURE) ×1 IMPLANT
TOWEL OR 17X24 6PK STRL BLUE (TOWEL DISPOSABLE) ×2 IMPLANT
TUBING NON-CON 1/4 X 20 CONN (TUBING) ×2 IMPLANT
YANKAUER SUCT BULB TIP NO VENT (SUCTIONS) ×2 IMPLANT

## 2016-11-05 NOTE — Anesthesia Procedure Notes (Signed)
Procedure Name: LMA Insertion Date/Time: 11/05/2016 11:25 AM Performed by: Shanon Payor Pre-anesthesia Checklist: Patient identified, Emergency Drugs available, Suction available, Timeout performed and Patient being monitored Patient Re-evaluated:Patient Re-evaluated prior to inductionOxygen Delivery Method: Circle system utilized Preoxygenation: Pre-oxygenation with 100% oxygen Intubation Type: IV induction LMA: LMA inserted LMA Size: 4.0 Number of attempts: 1 Placement Confirmation: positive ETCO2 and breath sounds checked- equal and bilateral Tube secured with: Tape Dental Injury: Teeth and Oropharynx as per pre-operative assessment

## 2016-11-05 NOTE — Transfer of Care (Signed)
Immediate Anesthesia Transfer of Care Note  Patient: Latoya Palmer  Procedure(s) Performed: Procedure(s): CONIZATION CERVIX WITH BIOPSY - COLD KNIFE (N/A)  Patient Location: PACU  Anesthesia Type:General  Level of Consciousness: awake, alert  and oriented  Airway & Oxygen Therapy: Patient Spontanous Breathing and Patient connected to nasal cannula oxygen  Post-op Assessment: Report given to RN and Post -op Vital signs reviewed and stable  Post vital signs: Reviewed and stable  Last Vitals:  Vitals:   11/05/16 0939  BP: 109/80  Pulse: 65  Resp: 16  Temp: 36.7 C    Last Pain:  Vitals:   11/05/16 0939  TempSrc: Oral  PainSc: 0-No pain      Patients Stated Pain Goal: 4 (11/05/16 0939)  Complications: No apparent anesthesia complications

## 2016-11-05 NOTE — Op Note (Signed)
11/05/2016  11:50 AM  PATIENT:  Latoya Palmer  31 y.o. female  PRE-OPERATIVE DIAGNOSIS:  HGSIL  POST-OPERATIVE DIAGNOSIS:  HGSIL  PROCEDURE:  Procedure(s): CONIZATION CERVIX WITH BIOPSY - COLD KNIFE (N/A)  SURGEON:  Surgeon(s) and Role:    * Allie Bossier, MD - Primary   ANESTHESIA:   general  EBL:  Total I/O In: -  Out: 10 [Blood:10]  BLOOD ADMINISTERED:none  DRAINS: none   LOCAL MEDICATIONS USED:  MARCAINE     SPECIMEN:  Source of Specimen:  cone biopsy of cervix and ECC  DISPOSITION OF SPECIMEN:  PATHOLOGY  COUNTS:  YES  TOURNIQUET:  * No tourniquets in log *  DICTATION: .Dragon Dictation  PLAN OF CARE: Discharge to home after PACU  PATIENT DISPOSITION:  PACU - hemodynamically stable.   Delay start of Pharmacological VTE agent (>24hrs) due to surgical blood loss or risk of bleeding: not applicable  The risks, benefits, and alternatives of surgery were explained, understood, and accepted. She was taken to the operating room and placed in the dorsal lithotomy position. Her vagina was prepped and draped in the usual sterile fashion. A timeout was done. A bimanual exam revealed a normal size and shape, anteverted mobile uterus. Non-enlarged adnexa were noted. A weighted speculum was placed in the posterior aspect the vagina and the cervix was visualized. Anterior lip of the cervix was grasped with a single-tooth tenaculum, and a paracervical block was done with 20 mL of 0.5% Marcaine. Acetic acid was placed on the cervix and allowed to be there for about a minute.  A cone shaped specimen of the cervix was removed and endocervical curettings were obtained. The acetowhite lesion was removed entirely. The cone bed was cauterized with the Bovie. Hemostasis was noted. A small piece of gel foam was placed on the cone bed. A pursestring closure of the cervix was done with a 2-0 Vicryl suture. Excellent hemostasis was noted. The instrumen,t sponge, and needle counts were correct.  She was taken to the recovery room in stable condition. She tolerated the procedure well.

## 2016-11-05 NOTE — Anesthesia Preprocedure Evaluation (Signed)
Anesthesia Evaluation  Patient identified by MRN, date of birth, ID band Patient awake    Reviewed: Allergy & Precautions, H&P , NPO status , Patient's Chart, lab work & pertinent test results  Airway Mallampati: I  TM Distance: >3 FB Neck ROM: full    Dental no notable dental hx. (+) Teeth Intact   Pulmonary neg pulmonary ROS, Current Smoker,    Pulmonary exam normal        Cardiovascular negative cardio ROS Normal cardiovascular exam     Neuro/Psych negative psych ROS   GI/Hepatic negative GI ROS, Neg liver ROS,   Endo/Other  negative endocrine ROS  Renal/GU negative Renal ROS  negative genitourinary   Musculoskeletal negative musculoskeletal ROS (+)   Abdominal Normal abdominal exam  (+)   Peds  Hematology negative hematology ROS (+)   Anesthesia Other Findings   Reproductive/Obstetrics negative OB ROS                             Anesthesia Physical Anesthesia Plan  ASA: II  Anesthesia Plan: General   Post-op Pain Management:    Induction: Intravenous  Airway Management Planned: LMA  Additional Equipment:   Intra-op Plan:   Post-operative Plan:   Informed Consent: I have reviewed the patients History and Physical, chart, labs and discussed the procedure including the risks, benefits and alternatives for the proposed anesthesia with the patient or authorized representative who has indicated his/her understanding and acceptance.   Dental Advisory Given  Plan Discussed with: CRNA and Surgeon  Anesthesia Plan Comments:         Anesthesia Quick Evaluation

## 2016-11-05 NOTE — H&P (Addendum)
Latoya Palmer is an 31 y.o. female.She is here for a CKC due to a + ECC. 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    Past Medical History:  Diagnosis Date  . Abnormal Pap smear   . Anemia   . Bacterial vaginosis   . Headache(784.0)    hx - last one 09/2016 -otc prn  . Heart murmur    hx - no problems  . Ovarian cyst   . Preterm labor   . SVD (spontaneous vaginal delivery) 2008   x 1  . Vaginal Pap smear, abnormal     Past Surgical History:  Procedure Laterality Date  . COLPOSCOPY    . MYRINGOTOMY    . THERAPEUTIC ABORTION     two    History reviewed. No pertinent family history.  Social History:  reports that she has been smoking Cigarettes.  She has a 5.50 pack-year smoking history. She has never used smokeless tobacco. She reports that she does not drink alcohol or use drugs.  Allergies:  Allergies  Allergen Reactions  . Latex Swelling and Other (See Comments)    Causes irritation    Prescriptions Prior to Admission  Medication Sig Dispense Refill Last Dose  . ibuprofen (ADVIL,MOTRIN) 200 MG tablet Take 400 mg by mouth daily as needed for headache or moderate pain.   Past Month at Unknown time  . Levonorgestrel-Ethinyl Estradiol (AMETHIA,CAMRESE) 0.15-0.03 &0.01 MG tablet Take 1 tablet by mouth daily. 1 Package 4 Unknown at Unknown time  . metroNIDAZOLE (FLAGYL) 500 MG tablet Take 1 tablet (500 mg total) by mouth 2 (two) times daily. (Patient not taking: Reported on 10/31/2016) 14 tablet 4 Completed Course at Unknown time    ROS  She is currently abstinent  Blood pressure 109/80, pulse 65, temperature 98.1 F (36.7 C), temperature source Oral, resp. rate 16, height  (1.651 m), weight 61.2 kg (135 lb), last menstrual period 10/27/2016, SpO2 100 %. Physical Exam  WNWHBFNAD Breathing and conversing normally Heart- rrr Lungs- CTAB Abd-  benign  Results for orders placed or performed during the hospital encounter of 11/05/16 (from the past 24 hour(s))  Pregnancy, urine     Status: None   Collection Time: 11/05/16  9:33 AM  Result Value Ref Range   Preg Test, Ur NEGATIVE NEGATIVE    No results found.  Assessment/Plan: + ECC- plan for CKC  She understands the risks of surgery, including, but not to infection, bleeding, DVTs, damage to bowel, bladder, ureters. She wishes to proceed.     Blimi Godby C Donyae Kohn 11/05/2016, 11:12 AM

## 2016-11-05 NOTE — Discharge Instructions (Signed)
DISCHARGE INSTRUCTIONS: D&C / D&E °The following instructions have been prepared to help you care for yourself upon your return home. °  °Personal hygiene: °• Use sanitary pads for vaginal drainage, not tampons. °• Shower the day after your procedure. °• NO tub baths, pools or Jacuzzis for 2-3 weeks. °• Wipe front to back after using the bathroom. ° °Activity and limitations: °• Do NOT drive or operate any equipment for 24 hours. The effects of anesthesia are still present and drowsiness may result. °• Do NOT rest in bed all day. °• Walking is encouraged. °• Walk up and down stairs slowly. °• You may resume your normal activity in one to two days or as indicated by your physician. ° °Sexual activity: NO intercourse for at least 2 weeks after the procedure, or as indicated by your physician. ° °Diet: Eat a light meal as desired this evening. You may resume your usual diet tomorrow. ° °Return to work: You may resume your work activities in one to two days or as indicated by your doctor. ° °What to expect after your surgery: Expect to have vaginal bleeding/discharge for 2-3 days and spotting for up to 10 days. It is not unusual to have soreness for up to 1-2 weeks. You may have a slight burning sensation when you urinate for the first day. Mild cramps may continue for a couple of days. You may have a regular period in 2-6 weeks. ° °Call your doctor for any of the following: °• Excessive vaginal bleeding, saturating and changing one pad every hour. °• Inability to urinate 6 hours after discharge from hospital. °• Pain not relieved by pain medication. °• Fever of 100.4° F or greater. °• Unusual vaginal discharge or odor. ° ° Call for an appointment:  ° ° °Patient’s signature: ______________________ ° °Nurse’s signature ________________________ ° °Support person's signature_______________________ ° ° °Cervical Conization, Care After °This sheet gives you information about how to care for yourself after your procedure.  Your doctor may also give you more specific instructions. If you have problems or questions, contact your doctor. °Follow these instructions at home: °Medicines °· Take over-the-counter and prescription medicines only as told by your doctor. °· Do not take aspirin until your doctor says it is okay. °· If you take pain medicine: °? You may have constipation. To help treat this, your doctor may tell you to: °§ Drink enough fluid to keep your pee (urine) clear or pale yellow. °§ Take medicines. °§ Eat foods that are high in fiber. These include fresh fruits and vegetables, whole grains, bran, and beans. °§ Limit foods that are high in fat and sugar. These include fried foods and sweet foods. °? Do not drive or use heavy machines. °General instructions °· You can eat your usual diet unless your doctor tells you not to do so. °· Take showers for the first week. Do not take baths, swim, or use hot tubs until your doctor says it is okay. °· Do not douche, use tampons, or have sex until your doctor says it is okay. °· For 7-14 days after your procedure, avoid: °? Being very active. °? Exercising. °? Heavy lifting. °· Keep all follow-up visits as told by your doctor. This is important. °Contact a doctor if: °· You have a rash. °· You are dizzy or lightheaded. °· You feel sick to your stomach (nauseous). °· You throw up (vomit). °· You have fluid from your vagina (vaginal discharge) that smells bad. °Get help right away if: °· There   are blood clots coming from your vagina. °· You have more bleeding than you would have in a normal period. For example, you soak a pad in less than 1 hour. °· You have a fever. °· You have more and more cramps. °· You pass out (faint). °· You have pain when peeing. °· Your have a lot of pain. °· Your pain gets worse. °· Your pain does not get better when you take your medicine. °· You have blood in your pee. °· You throw up (vomit). °Summary °· After your procedure, take over-the-counter and  prescription medicines only as told by your doctor. °· Do not douche, use tampons, or have sex until your doctor says it is okay. °· For about 7-14 days after your procedure, try not to exercise or lift heavy objects. °· Get help right away if you have new symptoms, or if your symptoms become worse. °This information is not intended to replace advice given to you by your health care provider. Make sure you discuss any questions you have with your health care provider. °Document Released: 04/23/2008 Document Revised: 07/17/2016 Document Reviewed: 07/17/2016 °Elsevier Interactive Patient Education © 2017 Elsevier Inc. ° °

## 2016-11-06 ENCOUNTER — Encounter (HOSPITAL_COMMUNITY): Payer: Self-pay | Admitting: Obstetrics & Gynecology

## 2016-11-06 NOTE — Anesthesia Postprocedure Evaluation (Addendum)
Anesthesia Post Note  Patient: Latoya Palmer  Procedure(s) Performed: Procedure(s) (LRB): CONIZATION CERVIX WITH BIOPSY - COLD KNIFE (N/A)  Patient location during evaluation: PACU Anesthesia Type: General Level of consciousness: awake Pain management: pain level controlled Vital Signs Assessment: post-procedure vital signs reviewed and stable Respiratory status: spontaneous breathing Cardiovascular status: stable Postop Assessment: no signs of nausea or vomiting Anesthetic complications: no        Last Vitals:  Vitals:   11/05/16 1245 11/05/16 1300  BP:  115/76  Pulse: (!) 52 (!) 54  Resp: 11 16  Temp: 36.8 C 36.8 C    Last Pain:  Vitals:   11/06/16 1436  TempSrc:   PainSc: 3    Pain Goal: Patients Stated Pain Goal: 4 (11/05/16 0939)               Remie Mathison JR,JOHN Susann Givens

## 2016-11-07 ENCOUNTER — Ambulatory Visit: Payer: 59 | Admitting: Obstetrics & Gynecology

## 2016-11-11 ENCOUNTER — Telehealth: Payer: Self-pay

## 2016-11-11 NOTE — Telephone Encounter (Signed)
Pt called and stated that she has not had a BM since last Monday.  Pt stated that she is not taking any pain medication.  I asked pt if she has taken anything for constipation otc.  Pt informed me that she has taken a laxative but it was not effective.  I encouraged pt to increase her dietary intake of foods with high fiber.  Notified Dr. Marice Potter, provider recommended to take Miralax 17g bid until BM.  Notified pt of providers recommendation.  Pt stated understanding with no further questions.

## 2016-11-18 ENCOUNTER — Inpatient Hospital Stay (HOSPITAL_COMMUNITY)
Admission: AD | Admit: 2016-11-18 | Discharge: 2016-11-18 | Disposition: A | Discharge status: Home / Self Care | Payer: 59 | Source: Ambulatory Visit | Attending: Obstetrics & Gynecology | Admitting: Obstetrics & Gynecology

## 2016-11-18 ENCOUNTER — Encounter (HOSPITAL_COMMUNITY): Payer: Self-pay

## 2016-11-18 DIAGNOSIS — N939 Abnormal uterine and vaginal bleeding, unspecified: Secondary | ICD-10-CM | POA: Diagnosis not present

## 2016-11-18 DIAGNOSIS — Z888 Allergy status to other drugs, medicaments and biological substances status: Secondary | ICD-10-CM | POA: Insufficient documentation

## 2016-11-18 DIAGNOSIS — Z79899 Other long term (current) drug therapy: Secondary | ICD-10-CM | POA: Diagnosis not present

## 2016-11-18 DIAGNOSIS — N83209 Unspecified ovarian cyst, unspecified side: Secondary | ICD-10-CM | POA: Diagnosis not present

## 2016-11-18 DIAGNOSIS — F17210 Nicotine dependence, cigarettes, uncomplicated: Secondary | ICD-10-CM | POA: Insufficient documentation

## 2016-11-18 DIAGNOSIS — Z9889 Other specified postprocedural states: Secondary | ICD-10-CM

## 2016-11-18 DIAGNOSIS — R109 Unspecified abdominal pain: Secondary | ICD-10-CM | POA: Insufficient documentation

## 2016-11-18 LAB — CBC WITH DIFFERENTIAL/PLATELET
BASOS ABS: 0 10*3/uL (ref 0.0–0.1)
BASOS PCT: 0 %
Eosinophils Absolute: 0.2 10*3/uL (ref 0.0–0.7)
Eosinophils Relative: 4 %
HEMATOCRIT: 34.6 % — AB (ref 36.0–46.0)
HEMOGLOBIN: 12 g/dL (ref 12.0–15.0)
Lymphocytes Relative: 48 %
Lymphs Abs: 2.7 10*3/uL (ref 0.7–4.0)
MCH: 30 pg (ref 26.0–34.0)
MCHC: 34.7 g/dL (ref 30.0–36.0)
MCV: 86.5 fL (ref 78.0–100.0)
MONO ABS: 0.2 10*3/uL (ref 0.1–1.0)
Monocytes Relative: 3 %
NEUTROS ABS: 2.5 10*3/uL (ref 1.7–7.7)
NEUTROS PCT: 45 %
Platelets: 205 10*3/uL (ref 150–400)
RBC: 4 MIL/uL (ref 3.87–5.11)
RDW: 13.4 % (ref 11.5–15.5)
WBC: 5.6 10*3/uL (ref 4.0–10.5)

## 2016-11-18 MED ORDER — KETOROLAC TROMETHAMINE 60 MG/2ML IM SOLN
60.0000 mg | Freq: Once | INTRAMUSCULAR | Status: AC
  Administered 2016-11-18: 60 mg via INTRAMUSCULAR
  Filled 2016-11-18: qty 2

## 2016-11-18 NOTE — Discharge Instructions (Signed)

## 2016-11-18 NOTE — MAU Provider Note (Signed)
History     CSN: [id number]  Arrival date and time: 11/18/16 4098   First Provider Initiated Contact with Patient 11/18/16 1031      Chief Complaint  Patient presents with  . Vaginal Bleeding   HPI   Latoya Palmer is a 31 y.o. female [phone] non pregnant, status post cone biopsy done on 4/10 here with vaginal bleeding.  She attests to light bleeding since procedure, however bleeding picked up on 4/20. She attests to changing 4 pads per day "pads are not full".  She is having some off and on abdominal cramping. She has not taken anything for it and doesn't feel she needs to.  She denied dizziness.   OB History    Gravida Para Term Preterm AB Living   0 6 1   SAB TAB Ectopic Multiple Live Births   3 3 0 0 1      Past Medical History:  Diagnosis Date  . Abnormal Pap smear   . Anemia   . Bacterial vaginosis   . Headache(784.0)    hx - last one 09/2016 -otc prn  . Heart murmur    hx - no problems  . Ovarian cyst   . Preterm labor   . SVD (spontaneous vaginal delivery) 2008   x 1  . Vaginal Pap smear, abnormal     Past Surgical History:  Procedure Laterality Date  . CERVICAL CONIZATION W/BX N/A 11/05/2016   Procedure: CONIZATION CERVIX WITH BIOPSY - COLD KNIFE;  Surgeon: Allie Bossier, MD;  Location: [facility];  Service: Gynecology;  Laterality: N/A;  . COLPOSCOPY    . MYRINGOTOMY    . THERAPEUTIC ABORTION     two    Family History  Problem Relation Age of Onset  . Cancer Neg Hx   . Diabetes Neg Hx   . Kidney disease Neg Hx   . Hypertension Neg Hx     Social History  Substance Use Topics  . Smoking status: Current Some Day Smoker    Packs/day: 0.50    Years: 11.00    Types: Cigarettes    Last attempt to quit: 05/12/2013  . Smokeless tobacco: Never Used  . Alcohol use No    Allergies:  Allergies  Allergen Reactions  . Latex Swelling and Other (See Comments)    Causes irritation    Prescriptions Prior to Admission  Medication Sig Dispense  Refill Last Dose  . ibuprofen (ADVIL,MOTRIN) 200 MG tablet Take 400 mg by mouth daily as needed for headache or moderate pain.   Past Month at Unknown time  . ibuprofen (ADVIL,MOTRIN) 600 MG tablet Take 1 tablet (600 mg total) by mouth every 6 (six) hours as needed. 30 tablet 1   . Levonorgestrel-Ethinyl Estradiol (AMETHIA,CAMRESE) 0.15-0.03 &0.01 MG tablet Take 1 tablet by mouth daily. 1 Package 4 Unknown at Unknown time  . metroNIDAZOLE (FLAGYL) 500 MG tablet TAKE ONE TABLET BY MOUTH TWICE DAILY 14 tablet 0   . oxyCODONE-acetaminophen (PERCOCET/ROXICET) 5-325 MG tablet Take 1-2 tablets by mouth every 6 (six) hours as needed. 20 tablet 0    Results for orders placed or performed during the hospital encounter of 11/18/16 (from the past 48 hour(s))  CBC with Differential     Status: Abnormal   Collection Time: 11/18/16 10:51 AM  Result Value Ref Range   WBC 5.6 4.0 - 10.5 K/uL   RBC 4.00 3.87 - 5.11 MIL/uL   Hemoglobin 12.0 12.0 - 15.0 g/dL  HCT 34.6 (L) 36.0 - 46.0 %   MCV 86.5 78.0 - 100.0 fL   MCH 30.0 26.0 - 34.0 pg   MCHC 34.7 30.0 - 36.0 g/dL   RDW 40.9 81.1 - 91.4 %   Platelets 205 150 - 400 K/uL   Neutrophils Relative % 45 %   Neutro Abs 2.5 1.7 - 7.7 K/uL   Lymphocytes Relative 48 %   Lymphs Abs 2.7 0.7 - 4.0 K/uL   Monocytes Relative 3 %   Monocytes Absolute 0.2 0.1 - 1.0 K/uL   Eosinophils Relative 4 %   Eosinophils Absolute 0.2 0.0 - 0.7 K/uL   Basophils Relative 0 %   Basophils Absolute 0.0 0.0 - 0.1 K/uL   Review of Systems  Gastrointestinal: Positive for abdominal pain.  Genitourinary: Positive for vaginal bleeding.  Neurological: Negative for dizziness.   Physical Exam   Blood pressure 111/75, pulse 67, resp. rate 18, height  (1.651 m), weight 133 lb (60.3 kg), last menstrual period 11/01/2016.  Physical Exam  Constitutional: She is oriented to person, place, and time. She appears well-developed and well-nourished. No distress.  HENT:  Head:  Normocephalic.  Eyes: Pupils are equal, round, and reactive to light.  Genitourinary:  Genitourinary Comments: Vagina - Small amount of pink, vaginal discharge,  no odor  Cervix - No contact bleeding, no active bleeding. Suture noted at 3 o'clock.  Bimanual exam: Cervix closed, non tender.  Uterus non tender, normal size Chaperone present for exam.   Musculoskeletal: Normal range of motion.  Neurological: She is alert and oriented to person, place, and time.  Skin: Skin is warm. She is not diaphoretic.  Psychiatric: Her behavior is normal.    MAU Course  Procedures  None  MDM  Discussed Patient with Dr Debroah Loop, minimal bleeding on exam today.  Margins negative for dysplasia, patient needs follow up in the [facility]. Discussed this with her.  Toradol given after pelvic exam: patient rates her pain 0/10 at the time of discharge.   Assessment and Plan   A:  1. Abnormal vaginal bleeding   2. S/P cone biopsy of cervix     P:  Discharge home in stable condition Call the [facility] to schedule follow up with Dr. Marice Potter. Return to MAU as needed, if symptoms worsen Bleeding precautions Ok to use naproxen as needed for pain, as directed    Duane Lope, NP 11/18/2016 5:13 PM

## 2016-11-18 NOTE — MAU Note (Signed)
Pt had a cone biopsy on 4/10. Pt states she had a small amount of bleeding for the week after. Pt states she started having some heavier bleeding on Thursday that she thought might be an early period. Pt states the heavy bleeding has continued and pt states she has some cramping which is different than normal for her. Pt states her LMP was 11/01/16

## 2016-11-18 NOTE — MAU Note (Signed)
Urine in lab 

## 2016-11-20 ENCOUNTER — Telehealth: Payer: Self-pay

## 2016-11-20 NOTE — Telephone Encounter (Signed)
Patient drop off FMLA paper work. These forms have been completed at this time.

## 2016-12-03 ENCOUNTER — Encounter: Payer: Self-pay | Admitting: Obstetrics & Gynecology

## 2016-12-03 ENCOUNTER — Ambulatory Visit (INDEPENDENT_AMBULATORY_CARE_PROVIDER_SITE_OTHER): Payer: 59 | Admitting: Obstetrics & Gynecology

## 2016-12-03 VITALS — BP 115/75 | HR 84 | Ht 65.0 in | Wt 141.0 lb

## 2016-12-03 DIAGNOSIS — Z9889 Other specified postprocedural states: Secondary | ICD-10-CM

## 2016-12-03 NOTE — Progress Notes (Signed)
   Subjective:    Patient ID: Latoya Palmer, female    DOB: 1986-01-27, 31 y.o.   MRN: [id number]  HPI 31 yo AA lady here for a post op visit. She had a CKC 4 weeks ago. No problems. No IC for 6 months. She is taking Chantix to quit smoking.   Review of Systems     Objective:   Physical Exam [id number] Breathing, conversing, and ambulating normally Her cervix is healed nicely. 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

## 2017-01-03 NOTE — Addendum Note (Signed)
Addendum  created 01/03/17 1149 by Leilani AbleHatchett, Koua Deeg, MD   Sign clinical note

## 2017-02-05 ENCOUNTER — Telehealth: Payer: Self-pay | Admitting: *Deleted

## 2017-02-05 NOTE — Telephone Encounter (Signed)
Pt left message stating that she had Cone biopsy in April. She had her first period after the procedure in June and it was light. Now she si having a period and it is very heavy. She is feeling drained and wants to know if this is normal. I called pt @ 1622 and left message stating that I am calling to discuss her concerns. If she is weak and/or dizzy she should go to MAU for evaluation. Also, if she is using one or more pads per hour for 3 consecutive hours, she should go to MAU. We will try to call her again tomorrow.

## 2017-02-06 NOTE — Telephone Encounter (Signed)
Call patient left another message concerning her cycle and to please call us back.

## 2017-10-08 ENCOUNTER — Other Ambulatory Visit: Payer: Self-pay

## 2017-10-08 ENCOUNTER — Encounter (HOSPITAL_COMMUNITY): Payer: Self-pay

## 2017-10-08 ENCOUNTER — Emergency Department (HOSPITAL_COMMUNITY): Payer: 59

## 2017-10-08 ENCOUNTER — Emergency Department (HOSPITAL_COMMUNITY)
Admission: EM | Admit: 2017-10-08 | Discharge: 2017-10-08 | Disposition: A | Discharge status: Home / Self Care | Payer: 59 | Attending: Emergency Medicine | Admitting: Emergency Medicine

## 2017-10-08 DIAGNOSIS — F17210 Nicotine dependence, cigarettes, uncomplicated: Secondary | ICD-10-CM | POA: Diagnosis not present

## 2017-10-08 DIAGNOSIS — Z79899 Other long term (current) drug therapy: Secondary | ICD-10-CM | POA: Diagnosis not present

## 2017-10-08 DIAGNOSIS — R0789 Other chest pain: Secondary | ICD-10-CM

## 2017-10-08 LAB — I-STAT BETA HCG BLOOD, ED (MC, WL, AP ONLY)

## 2017-10-08 LAB — CBC
HEMATOCRIT: 37.2 % (ref 36.0–46.0)
Hemoglobin: 12.4 g/dL (ref 12.0–15.0)
MCH: 28.7 pg (ref 26.0–34.0)
MCHC: 33.3 g/dL (ref 30.0–36.0)
MCV: 86.1 fL (ref 78.0–100.0)
PLATELETS: 224 10*3/uL (ref 150–400)
RBC: 4.32 MIL/uL (ref 3.87–5.11)
RDW: 13.6 % (ref 11.5–15.5)
WBC: 5.5 10*3/uL (ref 4.0–10.5)

## 2017-10-08 LAB — BASIC METABOLIC PANEL
Anion gap: 8 (ref 5–15)
BUN: 7 mg/dL (ref 6–20)
CALCIUM: 9.2 mg/dL (ref 8.9–10.3)
CO2: 23 mmol/L (ref 22–32)
CREATININE: 0.87 mg/dL (ref 0.44–1.00)
Chloride: 107 mmol/L (ref 101–111)
GFR calc Af Amer: >60 mL/min (ref >60)
Glucose, Bld: 88 mg/dL (ref 65–99)
POTASSIUM: 3.8 mmol/L (ref 3.5–5.1)
SODIUM: 138 mmol/L (ref 135–145)

## 2017-10-08 LAB — I-STAT TROPONIN, ED: TROPONIN I, POC: 0 ng/mL (ref 0.00–0.08)

## 2017-10-08 NOTE — ED Provider Notes (Signed)
[facility] EMERGENCY DEPARTMENT Provider Note   CSN: [id number] Arrival date & time: 10/08/17  1123     History   Chief Complaint Chief Complaint  Patient presents with  . Chest Pain    HPI Latoya Palmer is a 32 y.o. female.  HPI   Latoya Palmer is a 32 y.o. female, with a history of anemia and heart murmur in pregnancy, presenting to the ED with chest pain beginning today around 11 AM this morning. States she was sitting in a meeting when she began to feel chest pain, central chest, described as a tightness or squeezing, 7/10 at onset, now resolved, nonradiating. Lasted for about 40 minutes. Did not worsen with walking or exertion. Not pleuritic.   Denies drug use, regular alcohol use. Occasional tobacco use.  Denies N/V/D, diaphoresis, cough, fever/chills, recent illness, dizziness, syncope, shortness of breath, or any other complaints.   Past Medical History:  Diagnosis Date  . Abnormal Pap smear   . Anemia   . Bacterial vaginosis   . Headache(784.0)    hx - last one 09/2016 -otc prn  . Heart murmur    hx - no problems  . Ovarian cyst   . Preterm labor   . SVD (spontaneous vaginal delivery) 2008   x 1  . Vaginal Pap smear, abnormal     Patient Active Problem List   Diagnosis Date Noted  . HGSIL (high grade squamous intraepithelial lesion) on Pap smear of cervix 07/19/2016  . Miscarriage 09/17/2013  . BV (bacterial vaginosis) 08/28/2011  . Pap smear of cervix with ASCUS, cannot exclude HGSIL 08/28/2011    Past Surgical History:  Procedure Laterality Date  . CERVICAL CONIZATION W/BX N/A 11/05/2016   Procedure: CONIZATION CERVIX WITH BIOPSY - COLD KNIFE;  Surgeon: Allie Bossier, MD;  Location: [facility];  Service: Gynecology;  Laterality: N/A;  . COLPOSCOPY    . MYRINGOTOMY    . THERAPEUTIC ABORTION     two    OB History    Gravida Para Term Preterm AB Living   7 1 1  0 6 1   SAB TAB Ectopic Multiple Live Births   3 3 0 0 1        Home Medications    Prior to Admission medications   Medication Sig Start Date End Date Taking? Authorizing Provider  ibuprofen (ADVIL,MOTRIN) 200 MG tablet Take 400 mg by mouth daily as needed for headache or moderate pain.    [provider]  ibuprofen (ADVIL,MOTRIN) 600 MG tablet Take 1 tablet (600 mg total) by mouth every 6 (six) hours as needed. Patient not taking: Reported on 11/18/2016 11/05/16   Allie Bossier, MD  Levonorgestrel-Ethinyl Estradiol (AMETHIA,CAMRESE) 0.15-0.03 &0.01 MG tablet Take 1 tablet by mouth daily. Patient not taking: Reported on 11/18/2016 07/19/16   Allie Bossier, MD  metroNIDAZOLE (FLAGYL) 500 MG tablet TAKE ONE TABLET BY MOUTH TWICE DAILY 11/13/16   Allie Bossier, MD  oxyCODONE-acetaminophen (PERCOCET/ROXICET) 5-325 MG tablet Take 1-2 tablets by mouth every 6 (six) hours as needed. 11/05/16   Allie Bossier, MD  varenicline (CHANTIX) 1 MG tablet Take 1 mg by mouth 2 (two) times daily.    [provider]    Family History Family History  Problem Relation Age of Onset  . Heart attack Maternal Grandmother 50  . Cancer Neg Hx   . Diabetes Neg Hx   . Kidney disease Neg Hx   . Hypertension Neg Hx  Social History Social History   Tobacco Use  . Smoking status: Current Some Day Smoker    Packs/day: 0.50    Years: 11.00    Pack years: 5.50    Types: Cigarettes    Last attempt to quit: 05/12/2013    Years since quitting: 4.4  . Smokeless tobacco: Never Used  Substance Use Topics  . Alcohol use: No  . Drug use: No     Allergies   Latex   Review of Systems Review of Systems  Constitutional: Negative for chills, diaphoresis and fever.  Respiratory: Negative for cough and shortness of breath.   Cardiovascular: Positive for chest pain. Negative for palpitations and leg swelling.  Gastrointestinal: Negative for abdominal pain, diarrhea, nausea and vomiting.  Musculoskeletal: Negative for back pain.  Neurological: Negative for  dizziness, syncope, weakness and light-headedness.  All other systems reviewed and are negative.    Physical Exam Updated Vital Signs BP 110/81 (BP Location: Right Arm)   Pulse (!) 58   Temp 98.8 F (37.1 C) (Oral)   Resp 16   LMP 09/12/2017 (Exact Date)   SpO2 97%   Physical Exam  Constitutional: She appears well-developed and well-nourished. No distress.  HENT:  Head: Normocephalic and atraumatic.  Eyes: Conjunctivae are normal.  Neck: Neck supple.  Cardiovascular: Normal rate, regular rhythm, normal heart sounds and intact distal pulses.  Pulmonary/Chest: Effort normal and breath sounds normal. No respiratory distress. She exhibits tenderness.  Patient shows no increased work of breathing.  Speaks in full sentences without difficulty.    Abdominal: Soft. There is no tenderness. There is no guarding.  Musculoskeletal: She exhibits no edema.  Lymphadenopathy:    She has no cervical adenopathy.  Neurological: She is alert.  Skin: Skin is warm and dry. She is not diaphoretic.  Psychiatric: She has a normal mood and affect. Her behavior is normal.  Nursing note and vitals reviewed.    ED Treatments / Results  Labs (all labs ordered are listed, but only abnormal results are displayed) Labs Reviewed  BASIC METABOLIC PANEL  CBC  I-STAT TROPONIN, ED  I-STAT BETA HCG BLOOD, ED ([facility], [facility], [facility] ONLY)    EKG  EKG Interpretation  Date/Time:  Wednesday October 08 2017 11:29:46 EDT Ventricular Rate:  79 PR Interval:  140 QRS Duration: 80 QT Interval:  376 QTC Calculation: 431 R Axis:   68 Text Interpretation:  Normal sinus rhythm Normal ECG No STEMI.  Confirmed by Alona Bene [phone]) on 10/08/2017 10:59:21 PM       Radiology Dg Chest 2 View  Result Date: 10/08/2017 CLINICAL DATA:  chest pain and tightness with sob starting this am EXAM: CHEST - 2 VIEW COMPARISON:  none FINDINGS: Lungs are clear. Heart size and mediastinal contours are within normal limits. No effusion.   No pneumothorax. Visualized bones unremarkable. IMPRESSION: No acute cardiopulmonary disease. Electronically Signed   By: Corlis Leak M.D.   On: 10/08/2017 12:53    Procedures Procedures (including critical care time)  Medications Ordered in ED Medications - No data to display   Initial Impression / Assessment and Plan / ED Course  I have reviewed the triage vital signs and the nursing notes.  Pertinent labs & imaging results that were available during my care of the patient were reviewed by me and considered in my medical decision making (see chart for details).  Clinical Course as of Oct 09 1600  Wed Oct 08, 2017  1327 Patient reevaluated.  Continues to be pain  and symptom-free.  [SJ]    Clinical Course User Index [SJ] Hugh Kamara C, PA-C   Patient presents for evaluation following an instance of chest pain.  Low suspicion for ACS. HEART score is 1, indicating low risk for a cardiac event. Wells criteria score is 0, indicating low risk for PE. PERC negative. Discussed performing second troponin, patient acknowledged pros and cons were discussed and declined second troponin.  States she has ability for close PCP follow up and states she will follow up this week. The patient was given instructions for home care as well as return precautions. Patient voices understanding of these instructions, accepts the plan, and is comfortable with discharge.   Vitals:   10/08/17 1300 10/08/17 1303 10/08/17 1315 10/08/17 1330  BP: 110/81 110/81 112/85 109/81  Pulse: (!) 58 (!) 58 (!) 57 (!) 56  Resp: 19 16 18 18   Temp:  98.8 F (37.1 C)    TempSrc:  Oral    SpO2: 96% 97% 95% 95%     Final Clinical Impressions(s) / ED Diagnoses   Final diagnoses:  Atypical chest pain    ED Discharge Orders    None       Concepcion LivingJoy, Kendrick Remigio C, PA-C 10/09/17 1602    Maia PlanLong, Joshua G, MD 10/10/17 1003

## 2017-10-08 NOTE — Discharge Instructions (Signed)
Your lab results and x-ray were encouraging.  Please follow-up with your primary care provider as soon as possible in this manner.  Return to the ED for recurrence.  For continued chest soreness: Antiinflammatory medications: Take 600 mg of ibuprofen every 6 hours or 440 mg (over the counter dose) to 500 mg (prescription dose) of naproxen every 12 hours for the next 3 days. After this time, these medications may be used as needed for pain. Take these medications with food to avoid upset stomach. Choose only one of these medications, do not take them together. Tylenol: Should you continue to have additional pain while taking the ibuprofen or naproxen, you may add in tylenol as needed. Your daily total maximum amount of tylenol from all sources should be limited to 4000mg /day for persons without liver problems, or 2000mg /day for those with liver problems.

## 2017-10-08 NOTE — ED Triage Notes (Signed)
Pt presents for evaluation of chest pain and tightness with sob starting this AM while in a meeting.

## 2017-10-24 ENCOUNTER — Ambulatory Visit: Payer: Self-pay | Admitting: Urgent Care

## 2017-11-21 NOTE — Telephone Encounter (Signed)
Close encounter 

## 2017-11-24 ENCOUNTER — Ambulatory Visit: Payer: 59 | Admitting: Obstetrics & Gynecology

## 2018-03-04 ENCOUNTER — Telehealth: Payer: Self-pay | Admitting: General Practice

## 2018-03-04 ENCOUNTER — Telehealth: Payer: Self-pay

## 2018-03-04 DIAGNOSIS — B9689 Other specified bacterial agents as the cause of diseases classified elsewhere: Secondary | ICD-10-CM

## 2018-03-04 DIAGNOSIS — N760 Acute vaginitis: Principal | ICD-10-CM

## 2018-03-04 MED ORDER — METRONIDAZOLE 500 MG PO TABS: 500.0000 mg | ORAL_TABLET | Freq: Two times a day (BID) | ORAL | 0 refills | Status: DC

## 2018-03-04 NOTE — Telephone Encounter (Signed)
Patient called into front office requesting refill on Flagyl for BV infection. Refill sent in per protocol.

## 2018-03-04 NOTE — Telephone Encounter (Signed)
Pt called because she had a question, did not elaborate.

## 2018-03-09 NOTE — Telephone Encounter (Signed)
I called Latoya Palmer back and she  States she got everything worked out; no needs now.

## 2018-03-23 ENCOUNTER — Encounter: Payer: Self-pay | Admitting: *Deleted

## 2018-03-23 ENCOUNTER — Ambulatory Visit (INDEPENDENT_AMBULATORY_CARE_PROVIDER_SITE_OTHER): Payer: Medicaid Other | Admitting: Obstetrics & Gynecology

## 2018-03-23 ENCOUNTER — Encounter: Payer: Self-pay | Admitting: Obstetrics & Gynecology

## 2018-03-23 ENCOUNTER — Other Ambulatory Visit (HOSPITAL_COMMUNITY)
Admission: RE | Admit: 2018-03-23 | Discharge: 2018-03-23 | Disposition: A | Discharge status: Home / Self Care | Payer: Medicaid Other | Source: Ambulatory Visit | Attending: Obstetrics & Gynecology | Admitting: Obstetrics & Gynecology

## 2018-03-23 VITALS — BP 131/89 | HR 65 | Wt 136.5 lb

## 2018-03-23 DIAGNOSIS — Z01419 Encounter for gynecological examination (general) (routine) without abnormal findings: Secondary | ICD-10-CM | POA: Diagnosis present

## 2018-03-23 DIAGNOSIS — F439 Reaction to severe stress, unspecified: Secondary | ICD-10-CM

## 2018-03-23 DIAGNOSIS — Z0000 Encounter for general adult medical examination without abnormal findings: Secondary | ICD-10-CM | POA: Diagnosis present

## 2018-03-23 DIAGNOSIS — Z1151 Encounter for screening for human papillomavirus (HPV): Secondary | ICD-10-CM | POA: Insufficient documentation

## 2018-03-23 DIAGNOSIS — Z23 Encounter for immunization: Secondary | ICD-10-CM | POA: Diagnosis not present

## 2018-03-23 MED ORDER — VARENICLINE TARTRATE 1 MG PO TABS: 1.0000 mg | ORAL_TABLET | Freq: Two times a day (BID) | ORAL | 3 refills | Status: DC

## 2018-03-23 MED ORDER — TINIDAZOLE 500 MG PO TABS: 2.0000 g | ORAL_TABLET | Freq: Every day | ORAL | 2 refills | Status: DC

## 2018-03-23 NOTE — Progress Notes (Signed)
Subjective:    Latoya Palmer is a 32 y.o. single 3P1 65(33 yo daughter) female who presents for an annual exam. The patient has no complaints today. The patient is not currently sexually active. GYN screening history: last pap: was abnormal: h/o HGSIL, s/p CKC 4/18. The patient wears seatbelts: yes. The patient participates in regular exercise: no. Has the patient ever been transfused or tattooed?: yes. The patient reports that there is not domestic violence in her life.   Menstrual History: OB History    Gravida  7   Para  1   Term  1   Preterm  0   AB  6   Living  1     SAB  3   TAB  3   Ectopic  0   Multiple  0   Live Births  1           Menarche age: [age] Patient's last menstrual period was 03/13/2018 (exact date).    The following portions of the patient's history were reviewed and updated as appropriate: allergies, current medications, past family history, past medical history, past social history, past surgical history and problem list.  Review of Systems Pertinent items are noted in HPI.   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 Recurrent BV   Objective:    BP 131/89   Pulse 65   Wt 136 lb 8 oz (61.9 kg)   LMP 03/13/2018 (Exact Date)   BMI 22.71 kg/m   General Appearance:    Alert, cooperative, no distress, appears stated age  Head:    Normocephalic, without obvious abnormality, atraumatic  Eyes:    PERRL, conjunctiva/corneas clear, EOM's intact, fundi    benign, both eyes  Ears:    Normal TM's and external ear canals, both ears  Nose:   Nares normal, septum midline, mucosa normal, no drainage    or sinus tenderness  Throat:   Lips, mucosa, and tongue normal; teeth and gums normal  Neck:   Supple, symmetrical, trachea midline, no adenopathy;    thyroid:  no enlargement/tenderness/nodules; no carotid   bruit or JVD  Back:     Symmetric, no curvature, ROM normal, no CVA  tenderness  Lungs:     Clear to auscultation bilaterally, respirations unlabored  Chest Wall:    No tenderness or deformity   Heart:    Regular rate and rhythm, S1 and S2 normal, no murmur, rub   or gallop  Breast Exam:    No tenderness, masses, or nipple abnormality  Abdomen:     Soft, non-tender, bowel sounds active all four quadrants,    no masses, no organomegaly  Genitalia:    Normal female without lesion or tenderness, vaginal discharge c/w bv, normal size and shape, retroverted, mobile, non-tender, normal adnexal exam      Extremities:   Extremities normal, atraumatic, no cyanosis or edema  Pulses:   2+ and symmetric all extremities  Skin:   Skin color, texture, turgor normal, no rashes or lesions  Lymph nodes:   Cervical, supraclavicular, and axillary nodes normal  Neurologic:   CNII-XII intact, normal strength, sensation and reflexes    throughout  .    Assessment:    Healthy female exam.   History of vitamin D deficiency   Plan:     Thin prep Pap smear. with cotesting Start Gardasil TDAP today Check vit d and STI testing tindamax Restart Chantix

## 2018-03-24 ENCOUNTER — Other Ambulatory Visit: Payer: Self-pay | Admitting: Obstetrics & Gynecology

## 2018-03-24 LAB — CYTOLOGY - PAP
Chlamydia: NEGATIVE
DIAGNOSIS: NEGATIVE
HPV (WINDOPATH): NOT DETECTED
Neisseria Gonorrhea: NEGATIVE

## 2018-03-24 LAB — VITAMIN D 25 HYDROXY (VIT D DEFICIENCY, FRACTURES): VIT D 25 HYDROXY: 22.7 ng/mL — AB (ref 30.0–100.0)

## 2018-03-24 LAB — HEPATITIS B SURFACE ANTIGEN: Hepatitis B Surface Ag: NEGATIVE

## 2018-03-24 LAB — HIV ANTIBODY (ROUTINE TESTING W REFLEX): HIV Screen 4th Generation wRfx: NONREACTIVE

## 2018-03-24 LAB — RPR: RPR: NONREACTIVE

## 2018-03-24 LAB — HEPATITIS C ANTIBODY

## 2018-03-24 MED ORDER — VITAMIN D (ERGOCALCIFEROL) 1.25 MG (50000 UNIT) PO CAPS: 50000.0000 [IU] | ORAL_CAPSULE | ORAL | 1 refills | Status: DC

## 2018-03-24 NOTE — Progress Notes (Signed)
Vitamin D called in. Patient notified via Mychart.

## 2018-04-17 ENCOUNTER — Encounter: Payer: Self-pay | Admitting: *Deleted

## 2018-07-29 NOTE — L&D Delivery Note (Addendum)
OB/GYN Faculty Practice Delivery Note  Latoya Palmer is a 33 y.o. [id number] s/p SVDx2 at [redacted]w[redacted]d. She was admitted for IOL secondary to multi-fetal gestation (di/di twins).   ROM: 14h 67m with clear fluid GBS Status: Negative (08/05 0000) Maximum Maternal Temperature: 102.50F  Labor Progress: . Patient presented to L&D for  IOL secondary to multi-fetal gestation. Initial SVE: 1.5/80%/-1. Induction with AROM and Oxytocin. Epidural placed. Due to maternal fever and fetal tachycardia, patient placed on Ampicillin and Gentamicin. She then progressed to complete and felt the urge to push and was transferred to OR for delivery. Prior to delivery, Twin A vertex and Twin B breech presentation.    Delivery Date/Time: 03/17/2019: Twin A 1136 and Twin B 1214 Delivery: Fetal position again assessed in OR via BSUS. Patient feeling urge to push and Twin A head delivered in LOA position. No nuchal cord present. Shoulder and body delivered in usual fashion. Infant with spontaneous cry, placed on mother's abdomen, dried and stimulated. Cord clamped x 2 after 1-minute delay, and cut by FOB. Cord blood drawn. BSUS again confirmed position of Twin B and external cephalic version done with one attempt and Twin B rotated to vertex position. AROM performed when head well-applied. Twin B head delivered in ROA position. No nuchal cord present. Shoulder and body delivered in usual fashion. Infant with spontaneous cry, placed on mother's abdomen, dried and stimulated. Cord clamped x 2 after 1-minute delay, and cut by FOB. Cord blood drawn. Both pllacentas delivered spontaneously together with gentle cord traction. Fundus firm with massage and Pitocin. Labia, perineum, vagina, and cervix inspected inspected with 2nd degree laceration which was repaired with 3-0 Vicryl in standard fashion. Mother noted to have moderate bleeding after placentas delivered. Patient given rectal Cytotec, Methergine and TXA. Patient to receive one more  dose of Ampicillin post-partum as scheduled and is covered on Gentamicin until morning of 8/20. W  Placenta: Sent to L&D Complications: None Lacerations: 2nd degree perineal EBL: 600 mL Analgesia: Epidural    Postpartum Planning [ ]  message to sent to schedule follow-up  [ ]  vaccines UTD  Infant: APGAR (1 MIN):    Shadai, Mcclane [[id number]]  [address] [[id number]]  8    APGAR (5 MINS):    Ashwini, Jago [[id number]]  [address] [[id number]]  9    APGAR (10 MINS):    Yevette, Knust [[id number]]     Jennet, Scroggin [[id number]]       Barrington Ellison, MD [facility] Family Medicine Fellow, [facility] for [facility], [facility] 03/17/2019, 4:43 PM

## 2018-08-03 ENCOUNTER — Telehealth: Payer: Self-pay | Admitting: Family Medicine

## 2018-08-03 ENCOUNTER — Ambulatory Visit: Payer: Medicaid Other | Admitting: Obstetrics and Gynecology

## 2018-08-03 NOTE — Telephone Encounter (Signed)
Pt called stating that she is wanting to get an appt to be seen by the doctor. She is currently 8wks preg and cramping and has history of having a miscarriage with her last child. Advised patient that she would need to go to maternity admissions to get evaluated. Patient instantly became very rude and expressed why did she have to go to MAU when she spoke to a nurse and the office could see her the same day. Explained to patient that because of the cramping she is having and she is currently 8 weeks that's why she would have to go to maternity admissions. Pt expressed that she does not want to go to MAU just to get another bill she once to be seen. Explained to the patient that she started scheduling for our new ob pts when they are around 10-12 weeks so I could get her scheduled then. When I asked patient about a pregnancy verification later she stated that she only did a home preg test. Informed patient that before we could get her scheduled for her OB appt we would have to bring her in to get a preg test done first. Pt got even more rude and wanted to know why we needed to have a pregnancy test/ verification if she is already an existing patient. Informed this is the protocol for our office for every new pregnancy that we have to have verification before we can schedule an appt. Expressed to pt that I understand all of her concerns but this is protocol and we have to bring her in for a preg test or she would have to go to maternity admissions. Pt wanted me to disregard the fact that she was pregnant seeing as we have not confirmed anything, let pt know that I could not do that because she already stated that she was. Joni Reining spoke to the patient and told her to come in today (08/03/18) to after hours for her cramping.

## 2018-08-04 ENCOUNTER — Encounter (HOSPITAL_COMMUNITY): Payer: Self-pay | Admitting: *Deleted

## 2018-08-04 ENCOUNTER — Inpatient Hospital Stay (HOSPITAL_COMMUNITY): Payer: Medicaid Other

## 2018-08-04 ENCOUNTER — Other Ambulatory Visit: Payer: Self-pay | Admitting: Advanced Practice Midwife

## 2018-08-04 ENCOUNTER — Inpatient Hospital Stay (HOSPITAL_COMMUNITY)
Admission: AD | Admit: 2018-08-04 | Discharge: 2018-08-04 | Disposition: A | Discharge status: Home / Self Care | Payer: Medicaid Other | Source: Ambulatory Visit | Attending: Family Medicine | Admitting: Family Medicine

## 2018-08-04 ENCOUNTER — Other Ambulatory Visit: Payer: Self-pay

## 2018-08-04 DIAGNOSIS — O30049 Twin pregnancy, dichorionic/diamniotic, unspecified trimester: Secondary | ICD-10-CM

## 2018-08-04 DIAGNOSIS — O99331 Smoking (tobacco) complicating pregnancy, first trimester: Secondary | ICD-10-CM | POA: Diagnosis not present

## 2018-08-04 DIAGNOSIS — O210 Mild hyperemesis gravidarum: Secondary | ICD-10-CM | POA: Diagnosis not present

## 2018-08-04 DIAGNOSIS — R1030 Lower abdominal pain, unspecified: Secondary | ICD-10-CM | POA: Insufficient documentation

## 2018-08-04 DIAGNOSIS — F17210 Nicotine dependence, cigarettes, uncomplicated: Secondary | ICD-10-CM | POA: Insufficient documentation

## 2018-08-04 DIAGNOSIS — R109 Unspecified abdominal pain: Secondary | ICD-10-CM

## 2018-08-04 DIAGNOSIS — O26891 Other specified pregnancy related conditions, first trimester: Secondary | ICD-10-CM | POA: Insufficient documentation

## 2018-08-04 DIAGNOSIS — Z3A01 Less than 8 weeks gestation of pregnancy: Secondary | ICD-10-CM | POA: Insufficient documentation

## 2018-08-04 DIAGNOSIS — O3441 Maternal care for other abnormalities of cervix, first trimester: Secondary | ICD-10-CM | POA: Diagnosis present

## 2018-08-04 DIAGNOSIS — Z3491 Encounter for supervision of normal pregnancy, unspecified, first trimester: Secondary | ICD-10-CM

## 2018-08-04 LAB — CBC
HCT: 35.1 % — ABNORMAL LOW (ref 36.0–46.0)
Hemoglobin: 11.6 g/dL — ABNORMAL LOW (ref 12.0–15.0)
MCH: 28.5 pg (ref 26.0–34.0)
MCHC: 33 g/dL (ref 30.0–36.0)
MCV: 86.2 fL (ref 80.0–100.0)
PLATELETS: 220 10*3/uL (ref 150–400)
RBC: 4.07 MIL/uL (ref 3.87–5.11)
RDW: 13.3 % (ref 11.5–15.5)
WBC: 7.6 10*3/uL (ref 4.0–10.5)
nRBC: 0 % (ref 0.0–0.2)

## 2018-08-04 LAB — POCT PREGNANCY, URINE: Preg Test, Ur: POSITIVE — AB

## 2018-08-04 LAB — URINALYSIS, ROUTINE W REFLEX MICROSCOPIC
BILIRUBIN URINE: NEGATIVE
Glucose, UA: NEGATIVE mg/dL
Hgb urine dipstick: NEGATIVE
KETONES UR: NEGATIVE mg/dL
Leukocytes, UA: NEGATIVE
Nitrite: NEGATIVE
PH: 7 (ref 5.0–8.0)
Protein, ur: NEGATIVE mg/dL
Specific Gravity, Urine: 1.02 (ref 1.005–1.030)

## 2018-08-04 LAB — HCG, QUANTITATIVE, PREGNANCY: hCG, Beta Chain, Quant, S: 36892 m[IU]/mL — ABNORMAL HIGH (ref <5)

## 2018-08-04 MED ORDER — DOXYLAMINE-PYRIDOXINE ER 20-20 MG PO TBCR: 1.0000 | EXTENDED_RELEASE_TABLET | Freq: Two times a day (BID) | ORAL | 2 refills | Status: DC | PRN

## 2018-08-04 MED ORDER — PROMETHAZINE HCL 12.5 MG PO TABS: 12.5000 mg | ORAL_TABLET | Freq: Four times a day (QID) | ORAL | 0 refills | Status: DC | PRN

## 2018-08-04 MED ORDER — DOXYLAMINE-PYRIDOXINE ER 20-20 MG PO TBCR: 1.0000 | EXTENDED_RELEASE_TABLET | Freq: Two times a day (BID) | ORAL | 3 refills | Status: DC

## 2018-08-04 NOTE — MAU Note (Signed)
Pt presents to MAU with complaints of lower abdominal cramping and nausea for a week and a half. Denies any VB. + HPT last Monday

## 2018-08-04 NOTE — Progress Notes (Signed)
Patient called [facility] and reports that her rx was not at her pharmacy. RX was sent to [facility], but patient reports that she needed it sent to [facility]. New RX sent for the patient to the pharmacy she requested. Also sent in phenergan in case her insurance doesn't cover bonjesta.   Thressa Sheller DNP, CNM  08/04/18  9:50 PM

## 2018-08-04 NOTE — MAU Note (Signed)
Pt care delayed due to pt not being able to urinate. Explained to pt about process and apologized. Pt is upset and did not understand why we could not take blood and get ultrasound due to her not being able to urinate to test for pregnancy.

## 2018-08-04 NOTE — [facility]U Provider Note (Signed)
Chief Complaint: Abdominal Pain   First Provider Initiated Contact with Patient 08/04/18 1358     SUBJECTIVE HPI: Latoya Palmer is a 33 y.o. [id number] at [redacted]w[redacted]d by certain LMP who presents to Maternity Admissions reporting intermittent low abd cramping x 1 week. No pain currently. 6/10 at worst. Pos home UPT. No other testing this pregnancy.   Vaginal Bleeding: Denies Passage of tissue or clots: denies Dizziness: Denies  Modifying factors: hasn't tried anything for pain  Associated signs and symptoms: Pos for nausea and vomiting x 1. Neg for fever, chills, urinary complaints, vaginal discharge, vaginal bleeding.   O POS  Past Medical History:  Diagnosis Date  . Abnormal Pap smear   . Anemia   . Bacterial vaginosis   . Headache(784.0)    hx - last one 09/2016 -otc prn  . Heart murmur    hx - no problems  . Ovarian cyst   . Preterm labor   . SVD (spontaneous vaginal delivery) 2008   x 1  . Vaginal Pap smear, abnormal    OB History  Gravida Para Term Preterm AB Living  8 1 1  0 6 1  SAB TAB Ectopic Multiple Live Births  3 3 0 0 1    # Outcome Date GA Lbr Len/2nd Weight Sex Delivery Anes PTL Lv  8 Current           7 SAB 08/26/13             Birth Comments: System Generated. Please review and update pregnancy details.  6 Term 09/26/06    F Vag-Spont EPI Y LIV  5 TAB           4 TAB           3 TAB           2 SAB           1 SAB            Past Surgical History:  Procedure Laterality Date  . CERVICAL CONIZATION W/BX N/A 11/05/2016   Procedure: CONIZATION CERVIX WITH BIOPSY - COLD KNIFE;  Surgeon: Allie Bossier, MD;  Location: [facility];  Service: Gynecology;  Laterality: N/A;  . COLPOSCOPY    . MYRINGOTOMY    . THERAPEUTIC ABORTION     two   Social History   Socioeconomic History  . Marital status: Single    Spouse name: Not on file  . Number of children: Not on file  . Years of education: Not on file  . Highest education level: Not on file  Occupational History   . Not on file  Social Needs  . Financial resource strain: Not on file  . Food insecurity:    Worry: Not on file    Inability: Not on file  . Transportation needs:    Medical: Not on file    Non-medical: Not on file  Tobacco Use  . Smoking status: Current Some Day Smoker    Packs/day: 0.50    Years: 11.00    Pack years: 5.50    Types: Cigarettes    Last attempt to quit: 05/12/2013    Years since quitting: 5.2  . Smokeless tobacco: Never Used  Substance and Sexual Activity  . Alcohol use: No  . Drug use: No  . Sexual activity: Yes    Birth control/protection: None  Lifestyle  . Physical activity:    Days per week: Not on file    Minutes per session: Not  on file  . Stress: Not on file  Relationships  . Social connections:    Talks on phone: Not on file    Gets together: Not on file    Attends religious service: Not on file    Active member of club or organization: Not on file    Attends meetings of clubs or organizations: Not on file    Relationship status: Not on file  . Intimate partner violence:    Fear of current or ex partner: Not on file    Emotionally abused: Not on file    Physically abused: Not on file    Forced sexual activity: Not on file  Other Topics Concern  . Not on file  Social History Narrative  . Not on file   No current facility-administered medications on file prior to encounter.    Current Outpatient Medications on File Prior to Encounter  Medication Sig Dispense Refill  . metroNIDAZOLE (FLAGYL) 500 MG tablet Take 1 tablet (500 mg total) by mouth 2 (two) times daily. (Patient not taking: Reported on 03/23/2018) 14 tablet 0  . tinidazole (TINDAMAX) 500 MG tablet Take 4 tablets (2,000 mg total) by mouth daily with breakfast. For two days 8 tablet 2  . varenicline (CHANTIX) 1 MG tablet Take 1 tablet (1 mg total) by mouth 2 (two) times daily. 60 tablet 3  . Vitamin D, Ergocalciferol, (DRISDOL) 50000 units CAPS capsule Take 1 capsule (50,000 Units  total) by mouth every 7 (seven) days. 8 capsule 1   Allergies  Allergen Reactions  . Latex Swelling and Other (See Comments)    Causes irritation    I have reviewed the past Medical Hx, Surgical Hx, Social Hx, Allergies and Medications.   Review of Systems  Constitutional: Negative for chills and fever.  Gastrointestinal: Positive for abdominal pain, nausea and vomiting. Negative for abdominal distention, constipation and diarrhea.  Genitourinary: Negative for difficulty urinating, dysuria, flank pain, frequency, hematuria, pelvic pain, vaginal bleeding and vaginal discharge.  Musculoskeletal: Positive for back pain.    OBJECTIVE Patient Vitals for the past 24 hrs:  BP Temp Pulse Resp SpO2 Height Weight  08/04/18 1518 121/71 - - - - - -  08/04/18 1235 - - - - - 5\' 5"  (1.651 m) 65.3 kg  08/04/18 1234 (!) 109/53 98.1 F (36.7 C) 60 16 100 % - -   Constitutional: Well-developed, well-nourished female in no acute distress.  Cardiovascular: normal rate Respiratory: normal rate and effort.  GI: Abd soft, non-tender. Pos BS x 4 MS: Extremities nontender, no edema, normal ROM Neurologic: Alert and oriented x 4.  GU: Neg CVAT.  BIMANUAL EXAM: NEFG, physiologic discharge, no blood noted, cervix closed uterus slightly enlarged, no adnexal tenderness or masses. No CMT. Declined wet  Prep and cultures due to having them done recently.   LAB RESULTS Results for orders placed or performed during the hospital encounter of 08/04/18 (from the past 24 hour(s))  Urinalysis, Routine w reflex microscopic     Status: Abnormal   Collection Time: 08/04/18  1:48 PM  Result Value Ref Range   Color, Urine YELLOW YELLOW   APPearance HAZY (A) CLEAR   Specific Gravity, Urine 1.020 1.005 - 1.030   pH 7.0 5.0 - 8.0   Glucose, UA NEGATIVE NEGATIVE mg/dL   Hgb urine dipstick NEGATIVE NEGATIVE   Bilirubin Urine NEGATIVE NEGATIVE   Ketones, ur NEGATIVE NEGATIVE mg/dL   Protein, ur NEGATIVE NEGATIVE mg/dL    Nitrite NEGATIVE NEGATIVE   Leukocytes,  UA NEGATIVE NEGATIVE  Pregnancy, urine POC     Status: Abnormal   Collection Time: 08/04/18  1:53 PM  Result Value Ref Range   Preg Test, Ur POSITIVE (A) NEGATIVE  CBC     Status: Abnormal   Collection Time: 08/04/18  2:15 PM  Result Value Ref Range   WBC 7.6 4.0 - 10.5 K/uL   RBC 4.07 3.87 - 5.11 MIL/uL   Hemoglobin 11.6 (L) 12.0 - 15.0 g/dL   HCT 91.435.1 (L) 78.236.0 - 95.646.0 %   MCV 86.2 80.0 - 100.0 fL   MCH 28.5 26.0 - 34.0 pg   MCHC 33.0 30.0 - 36.0 g/dL   RDW 21.313.3 08.611.5 - 57.815.5 %   Platelets 220 150 - 400 K/uL   nRBC 0.0 0.0 - 0.2 %  hCG, quantitative, pregnancy     Status: Abnormal   Collection Time: 08/04/18  2:15 PM  Result Value Ref Range   hCG, Beta Chain, Quant, S 36,892 (H) <5 mIU/mL    IMAGING [place] Ob Comp Less 14 Wks  Result Date: 08/04/2018 CLINICAL DATA:  Abdominal pain. Gestational age by LMP of 7 weeks 6 days. EXAM: TWIN OBSTETRIC <14WK US AND TRANSVAGINAL OB US COMPARISON:  None. FINDINGS: Number of IUPs:  2 Chorionicity/Amnionicity:  Dichorionic-diamniotic (thick membrane) TWIN 1 Yolk sac:  Visualized. Embryo:  Visualized. Cardiac Activity: Visualized. Heart Rate: 100 bpm CRL:  4 mm   6 w 0 d                  US EDC: 03/30/2019 TWIN 2 Yolk sac:  Visualized. Embryo:  Visualized. Cardiac Activity: Visualized. Heart Rate: 109 bpm CRL:  3 mm   5 w 5 d                  US EDC: 04/01/2019 Subchorionic hemorrhage:  None visualized. Maternal uterus/adnexae: Both ovaries are normal in appearance. No mass or abnormal free fluid identified. IMPRESSION: Living dichorionic twin IUP with average gestational age of [redacted] weeks 6 days, and US EDC of 03/31/2019. No significant maternal uterine or adnexal abnormality identified. Electronically Signed   By: Myles RosenthalJohn  Stahl M.D.   On: 08/04/2018 15:34   [place] Ob Comp Addl Gest Less 14 Wks  Result Date: 08/04/2018 CLINICAL DATA:  Abdominal pain. Gestational age by LMP of 7 weeks 6 days. EXAM: TWIN OBSTETRIC <14WK US  AND TRANSVAGINAL OB US COMPARISON:  None. FINDINGS: Number of IUPs:  2 Chorionicity/Amnionicity:  Dichorionic-diamniotic (thick membrane) TWIN 1 Yolk sac:  Visualized. Embryo:  Visualized. Cardiac Activity: Visualized. Heart Rate: 100 bpm CRL:  4 mm   6 w 0 d                  US EDC: 03/30/2019 TWIN 2 Yolk sac:  Visualized. Embryo:  Visualized. Cardiac Activity: Visualized. Heart Rate: 109 bpm CRL:  3 mm   5 w 5 d                  US EDC: 04/01/2019 Subchorionic hemorrhage:  None visualized. Maternal uterus/adnexae: Both ovaries are normal in appearance. No mass or abnormal free fluid identified. IMPRESSION: Living dichorionic twin IUP with average gestational age of [redacted] weeks 6 days, and US EDC of 03/31/2019. No significant maternal uterine or adnexal abnormality identified. Electronically Signed   By: Myles RosenthalJohn  Stahl M.D.   On: 08/04/2018 15:34   [place] Ob Transvaginal  Result Date: 08/04/2018 CLINICAL DATA:  Abdominal pain. Gestational age by LMP of 7  weeks 6 days. EXAM: TWIN OBSTETRIC <14WK [place] AND TRANSVAGINAL OB US COMPARISON:  None. FINDINGS: Number of IUPs:  2 Chorionicity/Amnionicity:  Dichorionic-diamniotic (thick membrane) TWIN 1 Yolk sac:  Visualized. Embryo:  Visualized. Cardiac Activity: Visualized. Heart Rate: 100 bpm CRL:  4 mm   6 w 0 d                  [place] EDC: 03/30/2019 TWIN 2 Yolk sac:  Visualized. Embryo:  Visualized. Cardiac Activity: Visualized. Heart Rate: 109 bpm CRL:  3 mm   5 w 5 d                  [place] EDC: 04/01/2019 Subchorionic hemorrhage:  None visualized. Maternal uterus/adnexae: Both ovaries are normal in appearance. No mass or abnormal free fluid identified. IMPRESSION: Living dichorionic twin IUP with average gestational age of [redacted] weeks 6 days, and [place] EDC of 03/31/2019. No significant maternal uterine or adnexal abnormality identified. Electronically Signed   By: Myles Rosenthal M.D.   On: 08/04/2018 15:34    [facility] COURSE CBC, Quant, ABO/Rh, ultrasound, UA.   MDM Abd pain in early pregnancy  with normal twin intrauterine pregnancy and hemodynamically stable. Dating changed to 03/31/19 by [place].   ASSESSMENT 1. Abdominal pain during pregnancy in first trimester   2. Normal intrauterine pregnancy on prenatal ultrasound in first trimester   3.      N/V pregnancy  PLAN Discharge home in stable condition. First trimester precautions Consider serial cervical lengths 2/2 Hx Cone Bx.   Follow-up Information    [facility] Follow up in 4 week(s).   Specialty:  Obstetrics and Gynecology Why:  start prenatal care Contact information: [address] [place] [address] [phone]       [facility] Follow up.   Specialty:  Obstetrics and Gynecology Why:  as needed in pregancy emergenices Contact information: [address] [id number] mc [place] [address] [phone]         Allergies as of 08/04/2018      Reactions   Latex Swelling, Other (See Comments)   Causes irritation      Medication List    STOP taking these medications   tinidazole 500 MG tablet Commonly known as:  TINDAMAX   varenicline 1 MG tablet Commonly known as:  CHANTIX     TAKE these medications   Doxylamine-Pyridoxine ER 20-20 MG Tbcr Commonly known as:  BONJESTA Take 1 tablet by mouth 2 (two) times daily as needed.   metroNIDAZOLE 500 MG tablet Commonly known as:  FLAGYL Take 1 tablet (500 mg total) by mouth 2 (two) times daily.   Vitamin D (Ergocalciferol) 1.25 MG (50000 UT) Caps capsule Commonly known as:  DRISDOL Take 1 capsule (50,000 Units total) by mouth every 7 (seven) days.        Katrinka Blazing, [place], CNM 08/04/2018  3:20 PM  4

## 2018-08-04 NOTE — Discharge Instructions (Signed)
Abdominal Pain During Pregnancy ° °Abdominal pain is common during pregnancy, and has many possible causes. Some causes are more serious than others, and sometimes the cause is not known. Abdominal pain can be a sign that labor is starting. It can also be caused by normal growth and stretching of muscles and ligaments during pregnancy. Always tell your health care provider if you have any abdominal pain. °Follow these instructions at home: °· Do not have sex or put anything in your vagina until your pain goes away completely. °· Get plenty of rest until your pain improves. °· Drink enough fluid to keep your urine pale yellow. °· Take over-the-counter and prescription medicines only as told by your health care provider. °· Keep all follow-up visits as told by your health care provider. This is important. °Contact a health care provider if: °· Your pain continues or gets worse after resting. °· You have lower abdominal pain that: °? Comes and goes at regular intervals. °? Spreads to your back. °? Is similar to menstrual cramps. °· You have pain or burning when you urinate. °Get help right away if: °· You have a fever or chills. °· You have vaginal bleeding. °· You are leaking fluid from your vagina. °· You are passing tissue from your vagina. °· You have vomiting or diarrhea that lasts for more than 24 hours. °· Your baby is moving less than usual. °· You feel very weak or faint. °· You have shortness of breath. °· You develop severe pain in your upper abdomen. °Summary °· Abdominal pain is common during pregnancy, and has many possible causes. °· If you experience abdominal pain during pregnancy, tell your health care provider right away. °· Follow your health care provider's home care instructions and keep all follow-up visits as directed. °This information is not intended to replace advice given to you by your health care provider. Make sure you discuss any questions you have with your health care  provider. °Document Released: 07/15/2005 Document Revised: 10/17/2016 Document Reviewed: 10/17/2016 °Elsevier Interactive Patient Education © 2019 [employer] Inc. ° °

## 2018-08-05 ENCOUNTER — Other Ambulatory Visit: Payer: Self-pay

## 2018-08-05 MED ORDER — PROMETHAZINE HCL 25 MG PO TABS: 25.0000 mg | ORAL_TABLET | Freq: Four times a day (QID) | ORAL | 1 refills | Status: DC | PRN

## 2018-08-05 NOTE — Telephone Encounter (Addendum)
Pt came in to front office stating that she was prescribed a nausea medication that needed to be prior auth.  Per chart review pt was prescribed Diclegis in [facility]U which requires a form to be faxed.  I explained to the pt that I will fax the required form however it may take up to a week if we could prescribe her Phenergan 25 mg tablet to take in the mean time which is a higher dosage that she was taking prior.  Pt started to get upset stating that "I took that dosage before and it made me sleepy and was not that effective."  I explained to the pt that there is a process in order to get medications prior auth however I will be working on that today to try to get asap.  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  So I advised the pt that unfortuately there is a wait period for the approval for Diclegis.  Pt stated that she will be willing to take the Phenergan to have something.  Diclegis form faxed.

## 2018-08-06 ENCOUNTER — Telehealth: Payer: Self-pay | Admitting: *Deleted

## 2018-08-06 ENCOUNTER — Other Ambulatory Visit: Payer: Self-pay | Admitting: General Practice

## 2018-08-06 DIAGNOSIS — O219 Vomiting of pregnancy, unspecified: Secondary | ICD-10-CM

## 2018-08-06 MED ORDER — DOXYLAMINE-PYRIDOXINE 10-10 MG PO TBEC: DELAYED_RELEASE_TABLET | ORAL | 4 refills | Status: DC

## 2018-08-06 NOTE — Telephone Encounter (Signed)
Received message to contact patient regarding her experience with our office.  Patient had come into the office to request an appointment on Monday 08/03/18.  Patient states she was told to go to MAU to be seen.  Patient states she didn't feel that was indicated given her situation.  Patient further states she would like to know the status of the preauthorization for her nausea medication.  I discussed with patient her experience and apologized.  I explained I would follow up with the staff.  In regards to her nausea medication, she said she had been prescribed phenergan and bonjesta.  The phenergan was prescribed in case her insurance wouldn't cover the bonjesta.  Patient states phenergan doesn't work for her.  States she had it with her last pregnancy.  I contacted Dr. Shawnie Pons.  Okay to give diclegis.  Preauthorization was actually sent in by Ralene Bathe, RN for diclegis yesterday.  Notified patient via phone.  Encouraged patient to contact pharmacy before she goes over to pick up the medication just to be certain they have the preauthorization and have the medication ready for her.  Patient states understanding.  I again apologized for her experience and thanked her for making me aware.

## 2018-09-01 ENCOUNTER — Other Ambulatory Visit (HOSPITAL_COMMUNITY)
Admission: RE | Admit: 2018-09-01 | Discharge: 2018-09-01 | Disposition: A | Discharge status: Home / Self Care | Payer: Medicaid Other | Source: Ambulatory Visit | Attending: Obstetrics and Gynecology | Admitting: Obstetrics and Gynecology

## 2018-09-01 ENCOUNTER — Ambulatory Visit: Payer: Medicaid Other | Admitting: Clinical

## 2018-09-01 ENCOUNTER — Ambulatory Visit (INDEPENDENT_AMBULATORY_CARE_PROVIDER_SITE_OTHER): Payer: Medicaid Other | Admitting: Obstetrics and Gynecology

## 2018-09-01 ENCOUNTER — Encounter: Payer: Self-pay | Admitting: Obstetrics and Gynecology

## 2018-09-01 VITALS — BP 126/81 | HR 70 | Wt 149.1 lb

## 2018-09-01 DIAGNOSIS — O0990 Supervision of high risk pregnancy, unspecified, unspecified trimester: Secondary | ICD-10-CM

## 2018-09-01 DIAGNOSIS — R87613 High grade squamous intraepithelial lesion on cytologic smear of cervix (HGSIL): Secondary | ICD-10-CM

## 2018-09-01 DIAGNOSIS — O3441 Maternal care for other abnormalities of cervix, first trimester: Secondary | ICD-10-CM

## 2018-09-01 DIAGNOSIS — O99011 Anemia complicating pregnancy, first trimester: Secondary | ICD-10-CM

## 2018-09-01 DIAGNOSIS — O0991 Supervision of high risk pregnancy, unspecified, first trimester: Secondary | ICD-10-CM

## 2018-09-01 DIAGNOSIS — O30041 Twin pregnancy, dichorionic/diamniotic, first trimester: Secondary | ICD-10-CM | POA: Diagnosis not present

## 2018-09-01 DIAGNOSIS — Z23 Encounter for immunization: Secondary | ICD-10-CM | POA: Diagnosis not present

## 2018-09-01 DIAGNOSIS — O99019 Anemia complicating pregnancy, unspecified trimester: Secondary | ICD-10-CM

## 2018-09-01 NOTE — Progress Notes (Signed)
Transvaginal ultrasound scheduled for 3/17 @10 . Anatomy ultrasound scheduled for 4/7 @ 9:30. Pt made aware of both appointments.

## 2018-09-01 NOTE — Progress Notes (Signed)
INITIAL PRENATAL VISIT NOTE  Subjective:  Latoya Palmer is a 33 y.o. [phone] at [redacted]w[redacted]d by 6 week [place] being seen today for her initial prenatal visit. This is a planned pregnancy. She and partner are happy with the pregnancy. She was using nothing for birth control previously. She has an obstetric history significant for 1 x SVD. She has a medical history significant for cervical conization.  Patient reports nausea on Bonjesta. Contractions: Not present. Vag. Bleeding: None.   . Denies leaking of fluid.   Past Medical History:  Diagnosis Date  . Abnormal Pap smear   . Anemia   . Bacterial vaginosis   . Headache(784.0)    hx - last one 09/2016 -otc prn  . Heart murmur    hx - no problems  . Ovarian cyst   . Preterm labor   . SVD (spontaneous vaginal delivery) 2008   x 1  . Vaginal Pap smear, abnormal     Past Surgical History:  Procedure Laterality Date  . CERVICAL CONIZATION W/BX N/A 11/05/2016   Procedure: CONIZATION CERVIX WITH BIOPSY - COLD KNIFE;  Surgeon: Allie Bossier, MD;  Location: [facility];  Service: Gynecology;  Laterality: N/A;  . COLPOSCOPY    . MYRINGOTOMY    . THERAPEUTIC ABORTION     two    OB History  Gravida Para Term Preterm AB Living  8 1 1  0 6 1  SAB TAB Ectopic Multiple Live Births  3 3 0 0 1    # Outcome Date GA Lbr Len/2nd Weight Sex Delivery Anes PTL Lv  8 Current           7 SAB 08/26/13             Birth Comments: System Generated. Please review and update pregnancy details.  6 Term 09/26/06    F Vag-Spont EPI Y LIV  5 TAB           4 TAB           3 TAB           2 SAB           1 SAB             Social History   Socioeconomic History  . Marital status: Single    Spouse name: Not on file  . Number of children: Not on file  . Years of education: Not on file  . Highest education level: Not on file  Occupational History  . Not on file  Social Needs  . Financial resource strain: Not on file  . Food insecurity:    Worry: Not on file     Inability: Not on file  . Transportation needs:    Medical: Not on file    Non-medical: Not on file  Tobacco Use  . Smoking status: Current Some Day Smoker    Packs/day: 0.50    Years: 11.00    Pack years: 5.50    Types: Cigarettes    Last attempt to quit: 05/12/2013    Years since quitting: 5.3  . Smokeless tobacco: Never Used  Substance and Sexual Activity  . Alcohol use: No  . Drug use: No  . Sexual activity: Yes    Birth control/protection: None  Lifestyle  . Physical activity:    Days per week: Not on file    Minutes per session: Not on file  . Stress: Not on file  Relationships  . Social connections:  Talks on phone: Not on file    Gets together: Not on file    Attends religious service: Not on file    Active member of club or organization: Not on file    Attends meetings of clubs or organizations: Not on file    Relationship status: Not on file  Other Topics Concern  . Not on file  Social History Narrative  . Not on file    Family History  Problem Relation Age of Onset  . Heart attack Maternal Grandmother 50  . Cancer Neg Hx   . Diabetes Neg Hx   . Kidney disease Neg Hx   . Hypertension Neg Hx      Current Outpatient Medications:  .  Doxylamine-Pyridoxine 10-10 MG TBEC, Take 2 tabs HS. May take 1 tab at breakfast & 1 tab after lunch prn, Disp: 100 tablet, Rfl: 4 .  metroNIDAZOLE (FLAGYL) 500 MG tablet, Take 1 tablet (500 mg total) by mouth 2 (two) times daily. (Patient not taking: Reported on 03/23/2018), Disp: 14 tablet, Rfl: 0 .  promethazine (PHENERGAN) 12.5 MG tablet, Take 1 tablet (12.5 mg total) by mouth every 6 (six) hours as needed for nausea or vomiting., Disp: 30 tablet, Rfl: 0 .  promethazine (PHENERGAN) 25 MG tablet, Take 1 tablet (25 mg total) by mouth every 6 (six) hours as needed for nausea or vomiting., Disp: 30 tablet, Rfl: 1 .  Vitamin D, Ergocalciferol, (DRISDOL) 50000 units CAPS capsule, Take 1 capsule (50,000 Units total) by mouth  every 7 (seven) days., Disp: 8 capsule, Rfl: 1  Allergies  Allergen Reactions  . Latex Swelling and Other (See Comments)    Causes irritation   Review of Systems: Negative except for what is mentioned in HPI.  Objective:   Vitals:   09/01/18 1144  BP: 126/81  Pulse: 70  Weight: 149 lb 1.6 oz (67.6 kg)    Fetal Status: Fetal Heart Rate (bpm): 158/169         Physical Exam: BP 126/81   Pulse 70   Wt 149 lb 1.6 oz (67.6 kg)   LMP 06/10/2018   BMI 24.81 kg/m  CONSTITUTIONAL: Well-developed, well-nourished female in no acute distress.  NEUROLOGIC: Alert and oriented to person, place, and time. Normal reflexes, muscle tone coordination. No cranial nerve deficit noted. PSYCHIATRIC: Normal mood and affect. Normal behavior. Normal judgment and thought content. SKIN: Skin is warm and dry. No rash noted. Not diaphoretic. No erythema. No pallor. HENT:  Normocephalic, atraumatic, External right and left ear normal. Oropharynx is clear and moist EYES: Conjunctivae and EOM are normal. Pupils are equal, round, and reactive to light. No scleral icterus.  NECK: Normal range of motion, supple, no masses CARDIOVASCULAR: Normal heart rate noted, regular rhythm RESPIRATORY: Effort and breath sounds normal, no problems with respiration noted BREASTS: not examined ABDOMEN: Soft, nontender, nondistended, gravid. GU: normal appearing external female genitalia, multiparous normal appearing cervix, scant white discharge in vagina, no lesions noted Bimanual: 10 weeks sized uterus, no adnexal tenderness or palpable lesions noted MUSCULOSKELETAL: Normal range of motion. EXT:  No edema and no tenderness. 2+ distal pulses.   Assessment and Plan:  Pregnancy: [id number] at [redacted]w[redacted]d by 6 weeks [place]  1. Supervision of high risk pregnancy in first trimester - CHL AMB BABYSCRIPTS OPT IN - Inheritest(R) CF/SMA Panel - Hemoglobinopathy Evaluation - Obstetric Panel, Including HIV - [place] MFM OB DETAIL +14 WK;  Future - Culture, OB Urine  2. Bacterial Vaginosis Patient reports she has recurrent BV  and feels like she has symptoms now Will take flagyl she has at home  3. Dichorionic diamniotic twin pregnancy in first trimester - US MFM OB TRANSVAGINAL; Future  4. HGSIL (high grade squamous intraepithelial lesion) on Pap smear of cervix  5. History of cervical cone biopsy affecting care of mother in first trimester, antepartum US transvaginal ordered  Reviewed risks of preterm labor, mode of delivery, nature of practice with multiple providers.   Preterm labor symptoms and general obstetric precautions including but not limited to vaginal bleeding, contractions, leaking of fluid and fetal movement were reviewed in detail with the patient.  Please refer to After Visit Summary for other counseling recommendations.   Return in about 3 weeks (around 09/22/2018) for OB visit (MD).  Conan BowensKelly M Jupiter Boys 09/01/2018 12:41 PM

## 2018-09-01 NOTE — BH Specialist Note (Signed)
Integrated Behavioral Health Initial Visit  MRN: [id number] Name: Latoya Palmer  Number of Integrated Behavioral Health Clinician visits:: 1/6 Session Start time: 11:23  Session End time: 11:35 Total time: 15 minutes  Type of Service: Integrated Behavioral Health- Individual/Family Interpretor:No. Interpretor Name and Language: n/a   Warm Hand Off Completed.       SUBJECTIVE: Latoya Palmer is a 33 y.o. female accompanied by n/a Patient was referred by Leroy Libman, MD for Initial OB introduction to integrated behavioral health services . Patient reports the following symptoms/concerns: Pt states her primary concern today is feeling irritable about the wait to be seen today, and general fatigue in early twin pregnancy. Duration of problem: Current pregnancy; Severity of problem: moderate  OBJECTIVE: Mood: Irritable and Affect: Appropriate Risk of harm to self or others: No plan to harm self or others  LIFE CONTEXT: Family and Social: Pt has 12yo daughter  School/Work: - Self-Care: - Life Changes: Current twin pregnancy  GOALS ADDRESSED: Patient will: 1. Reduce symptoms of: stress 2. Increase knowledge and/or ability of: healthy habits and stress reduction  3. Demonstrate ability to: Increase healthy adjustment to current life circumstances and Increase adequate support systems for patient/family  INTERVENTIONS: Interventions utilized: Supportive Counseling, Psychoeducation and/or Health Education and Link to [facility]  Standardized Assessments completed: GAD-7 and PHQ 9  ASSESSMENT: Patient currently experiencing Supervision of high risk pregnancy in first trimester   Patient may benefit from Initial OB introduction to integrated behavioral health services .  PLAN: 1. Follow up with behavioral health clinician on : As needed 2. Behavioral recommendations:  -Continue taking prenatal vitamins, as recommended by medical provider -Consider  participating in Mothers of Multiples  support group, for additional support 3. Referral(s): Integrated Art gallery manager (In Clinic) and [facility]:  support group  4. "From scale of 1-10, how likely are you to follow plan?": -  Rae Lips, LCSW  Depression screen [facility] 2/9 09/01/2018 03/23/2018 10/24/2016 07/19/2016  Decreased Interest 0 0 1 0  Down, Depressed, Hopeless 0 0 0 0  PHQ - 2 Score 0 0 1 0  Altered sleeping 0 0 0 0  Tired, decreased energy 3 0 1 0  Change in appetite 0 0 0 0  Feeling bad or failure about yourself  0 0 0 0  Trouble concentrating 0 0 0 0  Moving slowly or fidgety/restless 0 0 0 0  Suicidal thoughts 0 0 0 0  PHQ-9 Score 3 0 2 0   GAD 7 : Generalized Anxiety Score 09/01/2018 03/23/2018 10/24/2016 07/19/2016  Nervous, Anxious, on Edge 0 0 0 0  Control/stop worrying 0 0 0 0  Worry too much - different things 0 0 0 0  Trouble relaxing 0 0 0 0  Restless 0 0 0 0  Easily annoyed or irritable 3 0 1 0  Afraid - awful might happen 0 0 0 0  Total GAD 7 Score 3 0 1 0

## 2018-09-01 NOTE — Patient Instructions (Signed)
You can buy Colace (docusate sodium) 100 mg by mouth once daily. If no improvement, take twice daily. This acts as a stool softener and should make it easier to have bowel movements. Please call the office with any questions.  

## 2018-09-02 LAB — GC/CHLAMYDIA PROBE AMP (~~LOC~~) NOT AT ARMC
Chlamydia: NEGATIVE
Neisseria Gonorrhea: NEGATIVE

## 2018-09-03 LAB — CULTURE, OB URINE

## 2018-09-03 LAB — URINE CULTURE, OB REFLEX

## 2018-09-10 LAB — OBSTETRIC PANEL, INCLUDING HIV
Antibody Screen: NEGATIVE
Basophils Absolute: 0 10*3/uL (ref 0.0–0.2)
Basos: 0 %
EOS (ABSOLUTE): 0.3 10*3/uL (ref 0.0–0.4)
Eos: 3 %
HEP B S AG: NEGATIVE
HIV Screen 4th Generation wRfx: NONREACTIVE
Hematocrit: 30.7 % — ABNORMAL LOW (ref 34.0–46.6)
Hemoglobin: 10.6 g/dL — ABNORMAL LOW (ref 11.1–15.9)
Immature Grans (Abs): 0 10*3/uL (ref 0.0–0.1)
Immature Granulocytes: 0 %
Lymphocytes Absolute: 2.5 10*3/uL (ref 0.7–3.1)
Lymphs: 29 %
MCH: 28.7 pg (ref 26.6–33.0)
MCHC: 34.5 g/dL (ref 31.5–35.7)
MCV: 83 fL (ref 79–97)
Monocytes Absolute: 0.8 10*3/uL (ref 0.1–0.9)
Monocytes: 9 %
Neutrophils Absolute: 5.1 10*3/uL (ref 1.4–7.0)
Neutrophils: 59 %
Platelets: 263 10*3/uL (ref 150–450)
RBC: 3.69 x10E6/uL — ABNORMAL LOW (ref 3.77–5.28)
RDW: 13.3 % (ref 11.7–15.4)
RH TYPE: POSITIVE
RPR Ser Ql: NONREACTIVE
Rubella Antibodies, IGG: 8.71 index (ref >0.99)
WBC: 8.7 10*3/uL (ref 3.4–10.8)

## 2018-09-10 LAB — HEMOGLOBINOPATHY EVALUATION
Ferritin: 86 ng/mL (ref 15–150)
Hgb A2 Quant: 2.1 % (ref 1.8–3.2)
Hgb A: 97.9 % (ref 96.4–98.8)
Hgb C: 0 %
Hgb F Quant: 0 % (ref 0.0–2.0)
Hgb S: 0 %
Hgb Solubility: NEGATIVE
Hgb Variant: 0 %

## 2018-09-10 LAB — INHERITEST(R) CF/SMA PANEL

## 2018-09-11 DIAGNOSIS — O99019 Anemia complicating pregnancy, unspecified trimester: Secondary | ICD-10-CM | POA: Insufficient documentation

## 2018-09-11 MED ORDER — FERROUS SULFATE 325 (65 FE) MG PO TABS: 325.0000 mg | ORAL_TABLET | Freq: Two times a day (BID) | ORAL | 1 refills | Status: DC

## 2018-09-11 NOTE — Addendum Note (Signed)
Addended by: Leroy Libman on: 09/11/2018 03:08 AM   Modules accepted: Orders

## 2018-10-02 ENCOUNTER — Other Ambulatory Visit: Payer: Self-pay

## 2018-10-02 ENCOUNTER — Ambulatory Visit (INDEPENDENT_AMBULATORY_CARE_PROVIDER_SITE_OTHER): Payer: Medicaid Other | Admitting: Obstetrics & Gynecology

## 2018-10-02 ENCOUNTER — Encounter: Payer: Self-pay | Admitting: Obstetrics & Gynecology

## 2018-10-02 VITALS — BP 116/62 | HR 76 | Wt 151.6 lb

## 2018-10-02 DIAGNOSIS — O0990 Supervision of high risk pregnancy, unspecified, unspecified trimester: Secondary | ICD-10-CM

## 2018-10-02 DIAGNOSIS — O99612 Diseases of the digestive system complicating pregnancy, second trimester: Secondary | ICD-10-CM

## 2018-10-02 DIAGNOSIS — O99019 Anemia complicating pregnancy, unspecified trimester: Secondary | ICD-10-CM

## 2018-10-02 DIAGNOSIS — Z3A14 14 weeks gestation of pregnancy: Secondary | ICD-10-CM

## 2018-10-02 DIAGNOSIS — O99012 Anemia complicating pregnancy, second trimester: Secondary | ICD-10-CM

## 2018-10-02 DIAGNOSIS — O30042 Twin pregnancy, dichorionic/diamniotic, second trimester: Secondary | ICD-10-CM

## 2018-10-02 DIAGNOSIS — K117 Disturbances of salivary secretion: Secondary | ICD-10-CM

## 2018-10-02 DIAGNOSIS — Z8639 Personal history of other endocrine, nutritional and metabolic disease: Secondary | ICD-10-CM

## 2018-10-02 MED ORDER — GLYCOPYRROLATE 2 MG PO TABS: 1.0000 mg | ORAL_TABLET | Freq: Three times a day (TID) | ORAL | 3 refills | Status: DC | PRN

## 2018-10-02 MED ORDER — ASPIRIN EC 81 MG PO TBEC: 81.0000 mg | DELAYED_RELEASE_TABLET | Freq: Every day | ORAL | 2 refills | Status: DC

## 2018-10-02 NOTE — Progress Notes (Addendum)
   PRENATAL VISIT NOTE  Subjective:  Latoya Palmer is a 33 y.o. [phone] at [redacted]w[redacted]d being seen today for ongoing prenatal care.  She is currently monitored for the following issues for this high-risk pregnancy and has History of cervical cone biopsy affecting care of mother in first trimester, antepartum; Dichorionic diamniotic twin gestation; Supervision of high risk pregnancy, antepartum; and Anemia in pregnancy on their problem list.  Patient reports excessive salivation. Also has gum pain, alleviated by Oragel (this happened last pregnancy, does not want to see dentist yet).  Contractions: Not present. Vag. Bleeding: None.  Movement: Present. Denies leaking of fluid.   The following portions of the patient's history were reviewed and updated as appropriate: allergies, current medications, past family history, past medical history, past social history, past surgical history and problem list. Problem list updated.  Objective:   Vitals:   10/02/18 0822  BP: 116/62  Pulse: 76  Weight: 151 lb 9.6 oz (68.8 kg)    Fetal Status: Fetal Heart Rate (bpm): 150s x 2 on u/s   Movement: Present     General:  Alert, oriented and cooperative. Patient is in no acute distress.  Skin: Skin is warm and dry. No rash noted.   Cardiovascular: Normal heart rate noted  Respiratory: Normal respiratory effort, no problems with respiration noted  Abdomen: Soft, gravid, appropriate for gestational age.  Pain/Pressure: Present     Pelvic: Cervical exam deferred        Extremities: Normal range of motion.  Edema: None  Mental Status: Normal mood and affect. Normal behavior. Normal judgment and thought content.   Assessment and Plan:  Pregnancy: [id number] at [redacted]w[redacted]d  1. Dichorionic diamniotic twin pregnancy in second trimester Scheduled for cervical length scan (history of cone biopsy) and anatomy scan. Recommended ASA for prevention of preeclampsia, baseline labs checked. - Genetic Screening/Panorama - aspirin  EC 81 MG tablet; Take 1 tablet (81 mg total) by mouth daily. Take after 12 weeks for prevention of preeclampsia later in pregnancy  Dispense: 300 tablet; Refill: 2 - Comprehensive metabolic panel - Hemoglobin A1c - Protein / creatinine ratio, urine  2. Excessive salivation while pregnant, second trimester Robinul prescribed. - glycopyrrolate (ROBINUL) 2 MG tablet; Take 0.5 tablets (1 mg total) by mouth 3 (three) times daily as needed.  Dispense: 30 tablet; Refill: 3  3. Anemia during pregnancy in second trimester On oral ferrous sulfate. - CBC  4. History of vitamin D deficiency Was on Vitamin D in past. - Vitamin D pnl(25-hydrxy+1,25-dihy)-bld  5. Supervision of high risk pregnancy, antepartum No other complaints or concerns.  Routine obstetric precautions reviewed. Please refer to After Visit Summary for other counseling recommendations.  Return in about 4 weeks (around 10/30/2018) for OB Visit (HOB).  Future Appointments  Date Time Provider Department Center  10/13/2018 10:00 AM WH-MFC [place] 3 WH-MFCUS MFC-US  10/27/2018  8:15 AM Levie Heritage, DO [facility]OCA WOC  11/03/2018  9:30 AM WH-MFC [place] 5 WH-MFCUS MFC-US    Jaynie Collins, MD

## 2018-10-02 NOTE — Patient Instructions (Signed)
Return to office for any scheduled appointments. Call the office or go to the MAU at [facility] if:  You begin to have strong, frequent contractions  Your water breaks.  Sometimes it is a big gush of fluid, sometimes it is just a trickle that keeps getting your panties wet or running down your legs  You have vaginal bleeding.  It is normal to have a small amount of spotting if your cervix was checked.   You do not feel your baby moving like normal.  If you do not, get something to eat and drink and lay down and focus on feeling your baby move.   If your baby is still not moving like normal, you should call the office or go to MAU.  Any other obstetric concerns.   

## 2018-10-05 LAB — PROTEIN / CREATININE RATIO, URINE
Creatinine, Urine: 258.8 mg/dL
Protein, Ur: 22.1 mg/dL
Protein/Creat Ratio: 85 mg/g creat (ref 0–200)

## 2018-10-05 LAB — COMPREHENSIVE METABOLIC PANEL
ALT: 16 IU/L (ref 0–32)
AST: 15 IU/L (ref 0–40)
Albumin/Globulin Ratio: 1.4 (ref 1.2–2.2)
Albumin: 3.7 g/dL — ABNORMAL LOW (ref 3.8–4.8)
Alkaline Phosphatase: 97 IU/L (ref 39–117)
BUN/Creatinine Ratio: 14 (ref 9–23)
BUN: 8 mg/dL (ref 6–20)
Bilirubin Total: <0.2 mg/dL (ref 0.0–1.2)
CO2: 19 mmol/L — ABNORMAL LOW (ref 20–29)
Calcium: 9.2 mg/dL (ref 8.7–10.2)
Chloride: 104 mmol/L (ref 96–106)
Creatinine, Ser: 0.57 mg/dL (ref 0.57–1.00)
GFR calc Af Amer: 141 mL/min/{1.73_m2} (ref >59)
GFR calc non Af Amer: 122 mL/min/{1.73_m2} (ref >59)
Globulin, Total: 2.6 g/dL (ref 1.5–4.5)
Glucose: 89 mg/dL (ref 65–99)
Potassium: 4.1 mmol/L (ref 3.5–5.2)
Sodium: 138 mmol/L (ref 134–144)
Total Protein: 6.3 g/dL (ref 6.0–8.5)

## 2018-10-05 LAB — HEMOGLOBIN A1C
Est. average glucose Bld gHb Est-mCnc: 103 mg/dL
Hgb A1c MFr Bld: 5.2 % (ref 4.8–5.6)

## 2018-10-05 LAB — VITAMIN D PNL(25-HYDRXY+1,25-DIHY)-BLD
Vit D, 1,25-Dihydroxy: 57.9 pg/mL (ref 19.9–79.3)
Vit D, 25-Hydroxy: 15.2 ng/mL — ABNORMAL LOW (ref 30.0–100.0)

## 2018-10-05 LAB — CBC
Hematocrit: 29.6 % — ABNORMAL LOW (ref 34.0–46.6)
Hemoglobin: 10.2 g/dL — ABNORMAL LOW (ref 11.1–15.9)
MCH: 29.1 pg (ref 26.6–33.0)
MCHC: 34.5 g/dL (ref 31.5–35.7)
MCV: 85 fL (ref 79–97)
PLATELETS: 277 10*3/uL (ref 150–450)
RBC: 3.5 x10E6/uL — ABNORMAL LOW (ref 3.77–5.28)
RDW: 13.3 % (ref 11.7–15.4)
WBC: 10.5 10*3/uL (ref 3.4–10.8)

## 2018-10-07 MED ORDER — DOCUSATE SODIUM 100 MG PO CAPS: 100.0000 mg | ORAL_CAPSULE | Freq: Two times a day (BID) | ORAL | 2 refills | Status: DC | PRN

## 2018-10-07 MED ORDER — FERROUS SULFATE 325 (65 FE) MG PO TABS: 325.0000 mg | ORAL_TABLET | Freq: Two times a day (BID) | ORAL | 1 refills | Status: DC

## 2018-10-07 NOTE — Addendum Note (Signed)
Addended by: Jaynie Collins A on: 10/07/2018 03:07 PM   Modules accepted: Orders

## 2018-10-13 ENCOUNTER — Ambulatory Visit (HOSPITAL_COMMUNITY): Payer: Medicaid Other | Admitting: *Deleted

## 2018-10-13 ENCOUNTER — Encounter (HOSPITAL_COMMUNITY): Payer: Self-pay

## 2018-10-13 ENCOUNTER — Other Ambulatory Visit (HOSPITAL_COMMUNITY): Payer: Self-pay | Admitting: *Deleted

## 2018-10-13 ENCOUNTER — Ambulatory Visit (HOSPITAL_COMMUNITY)
Admission: RE | Admit: 2018-10-13 | Discharge: 2018-10-13 | Disposition: A | Discharge status: Home / Self Care | Payer: Medicaid Other | Source: Ambulatory Visit | Attending: Obstetrics and Gynecology | Admitting: Obstetrics and Gynecology

## 2018-10-13 ENCOUNTER — Other Ambulatory Visit: Payer: Self-pay

## 2018-10-13 ENCOUNTER — Encounter: Payer: Self-pay | Admitting: *Deleted

## 2018-10-13 VITALS — BP 112/58 | HR 70 | Temp 98.4°F | Wt 152.2 lb

## 2018-10-13 DIAGNOSIS — O30042 Twin pregnancy, dichorionic/diamniotic, second trimester: Secondary | ICD-10-CM

## 2018-10-13 DIAGNOSIS — O0990 Supervision of high risk pregnancy, unspecified, unspecified trimester: Secondary | ICD-10-CM | POA: Insufficient documentation

## 2018-10-13 DIAGNOSIS — Z3A16 16 weeks gestation of pregnancy: Secondary | ICD-10-CM

## 2018-10-13 DIAGNOSIS — O30041 Twin pregnancy, dichorionic/diamniotic, first trimester: Secondary | ICD-10-CM

## 2018-10-13 DIAGNOSIS — Z3686 Encounter for antenatal screening for cervical length: Secondary | ICD-10-CM

## 2018-10-13 DIAGNOSIS — O3442 Maternal care for other abnormalities of cervix, second trimester: Secondary | ICD-10-CM

## 2018-10-19 ENCOUNTER — Encounter: Payer: Self-pay | Admitting: *Deleted

## 2018-10-26 ENCOUNTER — Telehealth: Payer: Self-pay | Admitting: Obstetrics and Gynecology

## 2018-10-26 NOTE — Telephone Encounter (Signed)
Rescheduled the patients appointment per the provider notes to postpone. Sending an appointment reminder as well.

## 2018-10-26 NOTE — Telephone Encounter (Signed)
The patient called to have her ultrasound appointment moved to tomorrow. Informed the patient our department can not reschedule ultrasound appointment, I then gave the number to the department. The patient stated she did not have access to blood pressure cuffs. She also stated if ultrasound can move her appointment she will come to our clinic to pick up the cuffs. Stated her blood pressure has always been low and its not a major concern. She also stated if ultrasound can not see her until next week she is not coming to pick up the cuffs until that time.

## 2018-10-27 ENCOUNTER — Encounter: Payer: Self-pay | Admitting: Family Medicine

## 2018-10-27 ENCOUNTER — Telehealth: Payer: Self-pay | Admitting: Student

## 2018-10-27 NOTE — Telephone Encounter (Signed)
The patient called in stating that she would like to have a referral to a different ultrasound clinic as the current clinic rescheduled her appointment and she is very upset. She also stated she feels that everyone else's appointment is important except hers. Explained to the patient the importance of keeping our patient and staff safe due to COVID19. After further looking into the appointment with ultrasound we found the appointment was cancelled due to the move of the department. The patient also stated she would like to see a different office and would like recommendations of other clinics. She was given business cards to our sister offices and also hand written notes with information to other clinics in our networking system.

## 2018-10-28 ENCOUNTER — Telehealth: Payer: Self-pay

## 2018-10-28 NOTE — Telephone Encounter (Signed)
Called pt and informed pt that I was not able to get a sooner appt however I expressed concern that receiving care outside we would need to request her ultrasound report.  So I did not want to cause a delay in her care.  Pt stated understanding.  Per chart review, pt's results can be obtained via Care Everywhere.  I called pt back and I informed pt that I will send over her referral information to [facility] and that we will be able to access her reports in our Care Everywhere system so the there should not be no delay in her prenatal care with [place] receiving her reports.   I also informed pt that I will send [facility] MFM office information to her via MyChart so that she can f/u.  Pt stated thank you with no further questions.

## 2018-10-28 NOTE — Progress Notes (Unsigned)
Pt came in today very upset because she stated that [place] canceled her appt and she is high risk and needs to be seen.  I confirmed with MFM why they rescheduled her appt, I was informed that they were going to be closed that week for the move.  I explained to the pt the reason why they moved her appt to the following week because of the move.  Pt stated that she had told them that she wanted to be this week and that she was not happy on how she was spoken to by the registrar person.  The pt stated that the person registering her was not listening to her concerns and just was like "whatever".  Pt stated that she was refusing to go to MFM for future appts.  I explained to the pt that MFM does [place] for our high risk pts throughout the community.  Pt stated that she did not care, she would drive where ever she needed to for her [place] so that she did not have to come here.  I explained to the pt that I would need to contact the other MFM offices in [facility] and [facility] to see how to refer them.  Pt stated that she wanted to continue to her prenatal care with [place] however she did not want to continue with MFM.  Pt stated that she would prefer to go to [facility] for her future [place].  Verified with pt that she was able to log in to [employer], added the the capability to the pt to be able to put in her BP's manually.  Pt given OMRON BP cuff and advised that she will need to put in 4 double A batteries for it to function and check BP once a week and place into app.  Pt demonstrated on how to place BP value in the app. I asked pt if I could get her appt with our MFM this week would want to stay.  Pt stated that at this time no, she would want to still get [place] at another MFM office.  I informed pt that I would contact her in regards to this week appt and referral to [facility].  Pt agreed.

## 2018-10-30 ENCOUNTER — Other Ambulatory Visit: Payer: Self-pay | Admitting: Advanced Practice Midwife

## 2018-11-03 ENCOUNTER — Ambulatory Visit (HOSPITAL_COMMUNITY): Payer: Medicaid Other

## 2018-11-09 ENCOUNTER — Telehealth: Payer: Self-pay | Admitting: *Deleted

## 2018-11-09 NOTE — Telephone Encounter (Signed)
Pt left message stating that she needs a letter. Please call back.

## 2018-11-10 ENCOUNTER — Ambulatory Visit (HOSPITAL_COMMUNITY)
Admission: RE | Admit: 2018-11-10 | Discharge: 2018-11-10 | Disposition: A | Discharge status: Home / Self Care | Payer: Medicaid Other | Source: Ambulatory Visit | Attending: Obstetrics and Gynecology | Admitting: Obstetrics and Gynecology

## 2018-11-10 ENCOUNTER — Ambulatory Visit (HOSPITAL_COMMUNITY): Payer: Medicaid Other | Admitting: *Deleted

## 2018-11-10 ENCOUNTER — Other Ambulatory Visit: Payer: Self-pay

## 2018-11-10 ENCOUNTER — Other Ambulatory Visit (HOSPITAL_COMMUNITY): Payer: Self-pay | Admitting: *Deleted

## 2018-11-10 ENCOUNTER — Encounter (HOSPITAL_COMMUNITY): Payer: Self-pay

## 2018-11-10 ENCOUNTER — Other Ambulatory Visit: Payer: Self-pay | Admitting: Obstetrics and Gynecology

## 2018-11-10 VITALS — BP 112/64 | HR 72 | Temp 98.3°F

## 2018-11-10 DIAGNOSIS — O3442 Maternal care for other abnormalities of cervix, second trimester: Secondary | ICD-10-CM | POA: Diagnosis not present

## 2018-11-10 DIAGNOSIS — O30049 Twin pregnancy, dichorionic/diamniotic, unspecified trimester: Secondary | ICD-10-CM

## 2018-11-10 DIAGNOSIS — O0990 Supervision of high risk pregnancy, unspecified, unspecified trimester: Secondary | ICD-10-CM

## 2018-11-10 DIAGNOSIS — O0991 Supervision of high risk pregnancy, unspecified, first trimester: Secondary | ICD-10-CM

## 2018-11-10 DIAGNOSIS — Z3A20 20 weeks gestation of pregnancy: Secondary | ICD-10-CM

## 2018-11-10 DIAGNOSIS — Z363 Encounter for antenatal screening for malformations: Secondary | ICD-10-CM

## 2018-11-10 DIAGNOSIS — O30042 Twin pregnancy, dichorionic/diamniotic, second trimester: Secondary | ICD-10-CM | POA: Diagnosis not present

## 2018-11-10 DIAGNOSIS — Z3686 Encounter for antenatal screening for cervical length: Secondary | ICD-10-CM

## 2018-11-10 NOTE — Telephone Encounter (Signed)
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  I explained to the pt that I would need to get in touch with the provider in regards to the letter.  Pt stated that she needed the letter urgently because her and her kids need food.

## 2018-11-11 NOTE — Telephone Encounter (Signed)
Contacted provider and per Dr. Shawnie Pons pt can be taken out of work if her work place can not abide by the work restrictions and if social distancing can not be practiced.  Called pt and notified pt that she will be able to access her letter in MyChart.  Pt stated thank you with no further questions.

## 2018-11-14 ENCOUNTER — Other Ambulatory Visit: Payer: Self-pay | Admitting: Advanced Practice Midwife

## 2018-11-16 ENCOUNTER — Encounter: Payer: Self-pay | Admitting: Obstetrics and Gynecology

## 2018-11-16 ENCOUNTER — Other Ambulatory Visit: Payer: Self-pay

## 2018-11-16 ENCOUNTER — Ambulatory Visit (INDEPENDENT_AMBULATORY_CARE_PROVIDER_SITE_OTHER): Payer: Medicaid Other | Admitting: Obstetrics and Gynecology

## 2018-11-16 ENCOUNTER — Telehealth: Payer: Self-pay | Admitting: Family Medicine

## 2018-11-16 VITALS — BP 115/58 | HR 76 | Wt 156.0 lb

## 2018-11-16 DIAGNOSIS — O0992 Supervision of high risk pregnancy, unspecified, second trimester: Secondary | ICD-10-CM

## 2018-11-16 DIAGNOSIS — O3441 Maternal care for other abnormalities of cervix, first trimester: Secondary | ICD-10-CM

## 2018-11-16 DIAGNOSIS — O3442 Maternal care for other abnormalities of cervix, second trimester: Secondary | ICD-10-CM

## 2018-11-16 DIAGNOSIS — O30042 Twin pregnancy, dichorionic/diamniotic, second trimester: Secondary | ICD-10-CM

## 2018-11-16 DIAGNOSIS — Z3A20 20 weeks gestation of pregnancy: Secondary | ICD-10-CM

## 2018-11-16 DIAGNOSIS — O0990 Supervision of high risk pregnancy, unspecified, unspecified trimester: Secondary | ICD-10-CM

## 2018-11-16 NOTE — Progress Notes (Signed)
I connected with  Latoya Palmer on 11/16/18 at  9:15 AM EDT by telephone and verified that I am speaking with the correct person using two identifiers.   I discussed the limitations, risks, security and privacy concerns of performing an evaluation and management service by telephone and the availability of in person appointments. I also discussed with the patient that there may be a patient responsible charge related to this service. The patient expressed understanding and agreed to proceed.  Linda,RN 11/16/2018  9:34 AM

## 2018-11-16 NOTE — Telephone Encounter (Signed)
The patient called in stating no one has called her. She was told she was scheduled for a virtual visit. The nurse Bonita Quin) stated she would call the patient as she was under the impression the visit was scheduled face to face.

## 2018-11-16 NOTE — Progress Notes (Signed)
   TELEHEALTH VIRTUAL OBSTETRICS VISIT ENCOUNTER NOTE  I connected with Latoya Palmer on 11/16/18 at  9:15 AM EDT by telephone at home and verified that I am speaking with the correct person using two identifiers.   I discussed the limitations, risks, security and privacy concerns of performing an evaluation and management service by telephone and the availability of in person appointments. I also discussed with the patient that there may be a patient responsible charge related to this service. The patient expressed understanding and agreed to proceed.  Subjective:  Latoya Palmer is a 33 y.o. [phone] at [redacted]w[redacted]d being followed for ongoing prenatal care.  She is currently monitored for the following issues for this high-risk pregnancy and has History of cervical cone biopsy affecting care of mother in first trimester, antepartum; Dichorionic diamniotic twin gestation; Supervision of high risk pregnancy, antepartum; and Anemia in pregnancy on their problem list.  Patient reports no complaints. Reports fetal movement. Denies any contractions, bleeding or leaking of fluid.   The following portions of the patient's history were reviewed and updated as appropriate: allergies, current medications, past family history, past medical history, past social history, past surgical history and problem list.   Objective:   General:  Alert, oriented and cooperative.   Mental Status: Normal mood and affect perceived. Normal judgment and thought content.  Rest of physical exam deferred due to type of encounter  Assessment and Plan:  Pregnancy: [id number] at [redacted]w[redacted]d 1. Supervision of high risk pregnancy, antepartum Stable  2. Dichorionic diamniotic twin pregnancy in second trimester Stable Good growth noted on last scan. F/U growth scan ordered  3. History of cervical cone biopsy affecting care of mother in first trimester, antepartum Normal CL noted on last U/S. F/U CL scan ordered  Preterm labor  symptoms and general obstetric precautions including but not limited to vaginal bleeding, contractions, leaking of fluid and fetal movement were reviewed in detail with the patient.  I discussed the assessment and treatment plan with the patient. The patient was provided an opportunity to ask questions and all were answered. The patient agreed with the plan and demonstrated an understanding of the instructions. The patient was advised to call back or seek an in-person office evaluation/go to MAU at [facility] for any urgent or concerning symptoms. Please refer to After Visit Summary for other counseling recommendations.   I provided 11 minutes of non-face-to-face time during this encounter.  No follow-ups on file.  Future Appointments  Date Time Provider Department Center  11/25/2018  1:00 PM WH-MFC [place] 3 WH-MFCUS MFC-US  12/08/2018  1:00 PM WH-MFC [place] 3 WH-MFCUS MFC-US    Hermina Staggers, MD [facility], [facility]

## 2018-11-25 ENCOUNTER — Ambulatory Visit (HOSPITAL_COMMUNITY)
Admission: RE | Admit: 2018-11-25 | Discharge: 2018-11-25 | Disposition: A | Discharge status: Home / Self Care | Payer: Medicaid Other | Source: Ambulatory Visit | Attending: Obstetrics and Gynecology | Admitting: Obstetrics and Gynecology

## 2018-11-25 ENCOUNTER — Encounter (HOSPITAL_COMMUNITY): Payer: Self-pay

## 2018-11-25 ENCOUNTER — Other Ambulatory Visit: Payer: Self-pay

## 2018-11-25 ENCOUNTER — Ambulatory Visit (HOSPITAL_COMMUNITY): Payer: Medicaid Other | Admitting: *Deleted

## 2018-11-25 VITALS — BP 112/60 | HR 83 | Temp 98.7°F

## 2018-11-25 DIAGNOSIS — O30049 Twin pregnancy, dichorionic/diamniotic, unspecified trimester: Secondary | ICD-10-CM

## 2018-11-25 DIAGNOSIS — Z3686 Encounter for antenatal screening for cervical length: Secondary | ICD-10-CM

## 2018-11-25 DIAGNOSIS — Z3A22 22 weeks gestation of pregnancy: Secondary | ICD-10-CM

## 2018-11-25 DIAGNOSIS — O0990 Supervision of high risk pregnancy, unspecified, unspecified trimester: Secondary | ICD-10-CM | POA: Diagnosis not present

## 2018-11-25 DIAGNOSIS — O30042 Twin pregnancy, dichorionic/diamniotic, second trimester: Secondary | ICD-10-CM | POA: Insufficient documentation

## 2018-11-25 DIAGNOSIS — O3441 Maternal care for other abnormalities of cervix, first trimester: Secondary | ICD-10-CM

## 2018-11-25 DIAGNOSIS — O3442 Maternal care for other abnormalities of cervix, second trimester: Secondary | ICD-10-CM | POA: Diagnosis not present

## 2018-11-25 DIAGNOSIS — O99012 Anemia complicating pregnancy, second trimester: Secondary | ICD-10-CM

## 2018-11-26 ENCOUNTER — Other Ambulatory Visit (HOSPITAL_COMMUNITY): Payer: Self-pay | Admitting: *Deleted

## 2018-11-26 DIAGNOSIS — O30049 Twin pregnancy, dichorionic/diamniotic, unspecified trimester: Secondary | ICD-10-CM

## 2018-11-30 ENCOUNTER — Other Ambulatory Visit: Payer: Self-pay | Admitting: Advanced Practice Midwife

## 2018-12-01 ENCOUNTER — Other Ambulatory Visit: Payer: Self-pay | Admitting: Obstetrics & Gynecology

## 2018-12-07 ENCOUNTER — Telehealth: Payer: Self-pay | Admitting: Obstetrics & Gynecology

## 2018-12-07 NOTE — Telephone Encounter (Signed)
Called the patient to confirm the appointment, the patient verbalized understanding. °

## 2018-12-08 ENCOUNTER — Ambulatory Visit (HOSPITAL_COMMUNITY): Payer: Medicaid Other

## 2018-12-09 ENCOUNTER — Other Ambulatory Visit: Payer: Self-pay

## 2018-12-09 ENCOUNTER — Ambulatory Visit (HOSPITAL_COMMUNITY): Payer: Medicaid Other | Admitting: *Deleted

## 2018-12-09 ENCOUNTER — Ambulatory Visit (INDEPENDENT_AMBULATORY_CARE_PROVIDER_SITE_OTHER): Payer: Medicaid Other | Admitting: Obstetrics & Gynecology

## 2018-12-09 ENCOUNTER — Encounter (HOSPITAL_COMMUNITY): Payer: Self-pay

## 2018-12-09 ENCOUNTER — Other Ambulatory Visit (HOSPITAL_COMMUNITY)
Admission: RE | Admit: 2018-12-09 | Discharge: 2018-12-09 | Disposition: A | Discharge status: Home / Self Care | Payer: Medicaid Other | Source: Ambulatory Visit | Attending: Obstetrics & Gynecology | Admitting: Obstetrics & Gynecology

## 2018-12-09 ENCOUNTER — Ambulatory Visit (HOSPITAL_COMMUNITY)
Admission: RE | Admit: 2018-12-09 | Discharge: 2018-12-09 | Disposition: A | Discharge status: Home / Self Care | Payer: Medicaid Other | Source: Ambulatory Visit | Attending: Maternal & Fetal Medicine | Admitting: Maternal & Fetal Medicine

## 2018-12-09 VITALS — BP 110/56 | HR 79 | Temp 98.3°F | Wt 167.1 lb

## 2018-12-09 VITALS — BP 124/60 | HR 92 | Temp 98.6°F

## 2018-12-09 DIAGNOSIS — O30042 Twin pregnancy, dichorionic/diamniotic, second trimester: Secondary | ICD-10-CM

## 2018-12-09 DIAGNOSIS — O0990 Supervision of high risk pregnancy, unspecified, unspecified trimester: Secondary | ICD-10-CM

## 2018-12-09 DIAGNOSIS — N898 Other specified noninflammatory disorders of vagina: Secondary | ICD-10-CM | POA: Diagnosis not present

## 2018-12-09 DIAGNOSIS — Z3A24 24 weeks gestation of pregnancy: Secondary | ICD-10-CM

## 2018-12-09 DIAGNOSIS — O0992 Supervision of high risk pregnancy, unspecified, second trimester: Secondary | ICD-10-CM

## 2018-12-09 DIAGNOSIS — O3442 Maternal care for other abnormalities of cervix, second trimester: Secondary | ICD-10-CM

## 2018-12-09 DIAGNOSIS — O30049 Twin pregnancy, dichorionic/diamniotic, unspecified trimester: Secondary | ICD-10-CM | POA: Diagnosis not present

## 2018-12-09 DIAGNOSIS — Z362 Encounter for other antenatal screening follow-up: Secondary | ICD-10-CM

## 2018-12-09 NOTE — Progress Notes (Signed)
PRENATAL VISIT NOTE  Subjective:  Latoya Palmer is a 33 y.o. [phone] at [redacted]w[redacted]d being seen today for ongoing prenatal care.  She is currently monitored for the following issues for this high-risk pregnancy and has History of cervical cone biopsy affecting care of mother in first trimester, antepartum; Dichorionic diamniotic twin gestation; Supervision of high risk pregnancy, antepartum; and Anemia in pregnancy on their problem list.  Patient reports vaginal irritation.  Contractions: Not present. Vag. Bleeding: None.  Movement: Present. Denies leaking of fluid.   The following portions of the patient's history were reviewed and updated as appropriate: allergies, current medications, past family history, past medical history, past social history, past surgical history and problem list.   Objective:   Vitals:   12/09/18 1444  BP: (!) 110/56  Pulse: 79  Temp: 98.3 F (36.8 C)  Weight: 167 lb 1.6 oz (75.8 kg)    Fetal Status: Fetal Heart Rate (bpm): 151/141   Movement: Present     General:  Alert, oriented and cooperative. Patient is in no acute distress.  Skin: Skin is warm and dry. No rash noted.   Cardiovascular: Normal heart rate noted  Respiratory: Normal respiratory effort, no problems with respiration noted  Abdomen: Soft, gravid, appropriate for gestational age.  Pain/Pressure: Present     Pelvic: On vulvar exam, no lesions, thin white discharge seen.  Testing sample obtained. Cervical exam deferred        Extremities: Normal range of motion.  Edema: Trace  Mental Status: Normal mood and affect. Normal behavior. Normal judgment and thought content.   [place] Mfm Ob Transvaginal  Result Date: 12/09/2018 ----------------------------------------------------------------------  OBSTETRICS REPORT                       (Signed Final 12/09/2018 02:26 pm) ---------------------------------------------------------------------- Patient Info  ID #:       [id number]                           D.O.B.:  September 24, 1985 (32 yrs)  Name:       Latoya Palmer               Visit Date: 12/09/2018 12:57 pm ---------------------------------------------------------------------- Performed By  Performed By:     Latoya Palmer          Ref. Address:     [address]                    RD[place]                                                             Rd  Attending:        Lin Palmer      Location[facility]                    MD                                       Fetal Care  Referred By:      Latoya Broad DAVIS  MD ---------------------------------------------------------------------- Orders   #  Description                          Code         Ordered By   1  [place] MFM OB TRANSVAGINAL               64332.9      Latoya Palmer   2  [place] MFM OB FOLLOW UP                  76816.01     Latoya Palmer   3  [place] MFM OB FOLLOW UP ADDL             51884.16     Latoya Palmer  ----------------------------------------------------------------------   #  Order #                    Accession #                 Episode #   1  [id number]                  [id number]                  [id number]   2  [id number]                  [id number]                  [id number]   3  [id number]                  [id number]                  [id number]  ---------------------------------------------------------------------- Indications   [redacted] weeks gestation of pregnancy                Z3A.24   Twin pregnancy, di/di, second trimester - low  O30.042   risk NIPS   Encounter for cervical length                  Z36.86   Previous cervical surgery (Cervical            O34.40   Conization with BX 2018)  ---------------------------------------------------------------------- Fetal Evaluation (Fetus A)  Num Of Fetuses:         2  Fetal Heart Rate(bpm):  130  Cardiac  Activity:       Observed  Fetal Lie:  Lower Fetus  Presentation:           Cephalic  Placenta:               Anterior  P. Cord Insertion:      Visualized  Membrane Desc:      Dividing Membrane seen - Dichorionic.  Amniotic Fluid  AFI FV:      Within normal limits                              Largest Pocket(cm)                              3.89 ---------------------------------------------------------------------- Biometry (Fetus A)  BPD:      63.1  mm     G. Age:  25w 4d         88  %    CI:        77.38   %    70 - 86                                                          FL/HC:      17.9   %    18.7 - 20.9  HC:      227.1  mm     G. Age:  24w 5d         54  %    HC/AC:      1.07        1.05 - 1.21  AC:      211.5  mm     G. Age:  25w 5d         84  %    FL/BPD:     64.5   %    71 - 87  FL:       40.7  mm     G. Age:  23w 1d         13  %    FL/AC:      19.2   %    20 - 24  Est. FW:     723  gm    1 lb 10 oz      62  %     FW Discordancy      0 \ 4 % ---------------------------------------------------------------------- OB History  Gravidity:    8         Term:   1         SAB:   3  TOP:          3        Living:  1 ---------------------------------------------------------------------- Gestational Age (Fetus A)  LMP:           26w 0d        Date:  06/10/18                 EDD:   03/17/19  U/S Today:     24w 6d                                        EDD:  03/25/19  Best:          24w 1d     Det. By:  Latoya Dubs         EDD:   03/30/19                                      (08/04/18) ---------------------------------------------------------------------- Anatomy (Fetus A)  Cranium:               Appears normal         LVOT:                   Appears normal  Cavum:                 Appears normal         Aortic Arch:            Appears normal  Ventricles:            Previously seen        Ductal Arch:            Previously seen  Choroid Plexus:        Previously seen        Diaphragm:               Appears normal  Cerebellum:            Previously seen        Stomach:                Appears normal, left                                                                        sided  Posterior Fossa:       Previously seen        Abdomen:                Appears normal  Nuchal Fold:           Previously seen        Abdominal Wall:         Previously seen  Face:                  Orbits and profile     Cord Vessels:           Previously seen                         previously seen  Lips:                  Previously seen        Kidneys:                Left sided  pyelectasis,4.3 mm  Palate:                Previously seen        Bladder:                Appears normal  Thoracic:              Appears normal         Spine:                  Appears normal  Heart:                 Appears normal         Upper Extremities:      Previously seen                         (4CH, axis, and                         situs)  RVOT:                  Appears normal         Lower Extremities:      Previously seen  Other:  Heels visualized. Nasal bone visualized. ---------------------------------------------------------------------- Fetal Evaluation (Fetus B)  Num Of Fetuses:         2  Fetal Heart Rate(bpm):  155  Cardiac Activity:       Observed  Fetal Lie:              Upper Fetus  Presentation:           Variable  Placenta:               Posterior  Membrane Desc:      Dividing Membrane seen - Dichorionic.  Amniotic Fluid  AFI FV:      Within normal limits                              Largest Pocket(cm)                              5.54 ---------------------------------------------------------------------- Biometry (Fetus B)  BPD:      61.3  mm     G. Age:  24w 6d         71  %    CI:        75.33   %    70 - 86                                                          FL/HC:      19.4   %    18.7 - 20.9  HC:       224   mm     G. Age:  24w 3d         43  %    HC/AC:       1.13        1.05 - 1.21  AC:       198   mm     G. Age:  24w 4d  51  %    FL/BPD:     70.8   %    71 - 87  FL:       43.4  mm     G. Age:  24w 2d         40  %    FL/AC:      21.9   %    20 - 24  LV:        5.6  mm  Est. FW:     692  gm      1 lb 8 oz     57  %     FW Discordancy         4  % ---------------------------------------------------------------------- Gestational Age (Fetus B)  LMP:           26w 0d        Date:  06/10/18                 EDD:   03/17/19  U/S Today:     24w 4d                                        EDD:   03/27/19  Best:          24w 1d     Det. ByMarcella Dubs         EDD:   03/30/19                                      (08/04/18) ---------------------------------------------------------------------- Anatomy (Fetus B)  Cranium:               Appears normal         Aortic Arch:            Previously seen  Cavum:                 Appears normal         Ductal Arch:            Previously seen  Ventricles:            Appears normal         Diaphragm:              Appears normal  Choroid Plexus:        Previously seen        Stomach:                Appears normal, left                                                                        sided  Cerebellum:            Previously seen        Abdomen:                Appears normal  Posterior Fossa:       Previously seen        Abdominal Wall:  Previously seen  Nuchal Fold:           Previously seen        Cord Vessels:           Previously seen  Face:                  Orbits and profile     Kidneys:                Appear normal                         previously seen  Lips:                  Previously seen        Bladder:                Appears normal  Thoracic:              Appears normal         Spine:                  Previously seen  Heart:                 Appears normal         Upper Extremities:      Previously seen                         (4CH, axis, and                         situs)  RVOT:                  Appears  normal         Lower Extremities:      Previously seen  LVOT:                  Appears normal  Other:  Heels and 5th digit visualized. ---------------------------------------------------------------------- Cervix Uterus Adnexa  Cervix  Length:           3.08  cm.  Normal appearance by transvaginal scan  Uterus  No abnormality visualized.  Left Ovary  Not visualized.  Right Ovary  Not visualized.  Adnexa  No abnormality visualized. ---------------------------------------------------------------------- Impression  Diamniotic dichorionic  Normal cervical length ---------------------------------------------------------------------- Recommendations  Follow up growth in 4 weeks.-previously scheduled.  No further  CL required at this time. ----------------------------------------------------------------------               Latoya Landsman, MD Electronically Signed Final Report   12/09/2018 02:26 pm ----------------------------------------------------------------------  [place] Mfm Ob Transvaginal  Result Date: 11/25/2018 ----------------------------------------------------------------------  OBSTETRICS REPORT                       (Signed Final 11/25/2018 03:52 pm) ---------------------------------------------------------------------- Patient Info  ID #:       [id number]                          D.O.B.:  05-Feb-1986 (32 yrs)  Name:       Latoya Palmer               Visit Date: 11/25/2018 01:15 pm ---------------------------------------------------------------------- Performed By  Performed By:     Magnus Ivan           Ref. Address:  [address]                    [place], [place]                                                             Rd  Attending:        Lin Landsmanorenthian Booker      Location[facility]                    MD                                       Fetal Care  Referred By:      Conan BowensKELLY M Palmer                    MD ----------------------------------------------------------------------  Orders   #  Description                          Code         Ordered By   1  US MFM OB TRANSVAGINAL               46962.976817.2      Noralee SpaceAVI SHANKAR  ----------------------------------------------------------------------   #  Order #                    Accession #                 Episode #   1  [id number]                  [id number]  ---------------------------------------------------------------------- Indications   [redacted] weeks gestation of pregnancy                Z3A.22   Twin pregnancy, di/di, second trimester - low  O30.042   risk NIPS   Encounter for cervical length                  Z36.86   Previous cervical surgery (Cervical            O34.40   Conization with BX 2018)  ---------------------------------------------------------------------- Fetal Evaluation (Fetus A)  Num Of Fetuses:         2  Cardiac Activity:       Observed  Fetal Lie:              Oblique, Lower Fetus  Presentation:           Cephalic  Placenta:               Anterior ---------------------------------------------------------------------- OB History  Gravidity:    8         Term:   1         SAB:   3  TOP:          3        Living:  1 ---------------------------------------------------------------------- Gestational Age (Fetus A)  LMP:  24w 0d        Date:  06/10/18                 EDD:   03/17/19  Best:          22w 1d     Det. ByMarcella Dubs         EDD:   03/30/19                                      (08/04/18) ---------------------------------------------------------------------- Fetal Evaluation (Fetus B)  Num Of Fetuses:         2  Fetal Heart Rate(bpm):  150  Cardiac Activity:       Observed  Fetal Lie:              Upper Fetus, Left  Presentation:           Breech  Placenta:               Posterior  Membrane Desc:      Dividing Membrane seen - Dichorionic. ---------------------------------------------------------------------- Gestational Age (Fetus B)  LMP:           24w 0d        Date:  06/10/18                  EDD:   03/17/19  Best:          22w 1d     Det. ByMarcella Dubs         EDD:   03/30/19                                      (08/04/18) ---------------------------------------------------------------------- Cervix Uterus Adnexa  Cervix  Length:            2.2  cm.  Notching of os visualized after abd pressure llifted. ---------------------------------------------------------------------- Impression  Cervical length 2.2 to 3.23 with and without abdominal  pressure. ---------------------------------------------------------------------- Recommendations  Follow up growth in 2 week and 6 weeks  Repeat CL in 2 weeks with growth exam ----------------------------------------------------------------------               Latoya Landsman, MD Electronically Signed Final Report   11/25/2018 03:52 pm ----------------------------------------------------------------------  [place] Mfm Ob Detail +14 Wk  Result Date: 11/10/2018 ----------------------------------------------------------------------  OBSTETRICS REPORT                       (Signed Final 11/10/2018 10:54 am) ---------------------------------------------------------------------- Patient Info  ID #:       [id number]                          D.O.B.:  1986/06/23 (32 yrs)  Name:       Latoya Palmer               Visit Date: 11/10/2018 08:04 am ---------------------------------------------------------------------- Performed By  Performed By:     Eden Lathe BS      Ref. Address:     [address]                    [place]  Rd  Attending:        Lin Palmer      Location[facility]                    MD                                       Fetal Care  Referred By:      Conan Bowens                    MD ---------------------------------------------------------------------- Orders   #  Description                          Code         Ordered By   1  [place] MFM OB DETAIL  +14 WK              76811.01     KELLY Palmer   2  [place] MFM OB DETAIL ADDL GEST           76811.02     KELLY Palmer      +14 WK  ----------------------------------------------------------------------   #  Order #                    Accession #                 Episode #   1  [id number]                  [id number]                  [id number]   2  [id number]                  [id number]                  [id number]  ---------------------------------------------------------------------- Indications   Encounter for antenatal screening for          Z36.3   malformations   Twin pregnancy, di/di, second trimester - low  O30.042   risk NIPS   Encounter for cervical length                  Z36.86   Previous cervical surgery (Cervical            O34.40   Conization with BX 2018)   [redacted] weeks gestation of pregnancy                Z3A.20  ---------------------------------------------------------------------- Fetal Evaluation (Fetus A)  Num Of Fetuses:         2  Fetal Heart Rate(bpm):  151  Cardiac Activity:       Observed  Fetal Lie:              Lower Fetus  Presentation:           Cephalic  Placenta:               Anterior  P. Cord Insertion:      Visualized  Membrane Desc:      Dividing Membrane seen - Dichorionic.  Amniotic Fluid  AFI FV:      Within normal limits  Largest Pocket(cm)                              4 ---------------------------------------------------------------------- Biometry (Fetus A)  BPD:      49.8  mm     G. Age:  21w 0d         88  %    CI:        77.25   %    70 - 86                                                          FL/HC:      17.2   %    16.8 - 19.8  HC:      179.4  mm     G. Age:  20w 2d         60  %    HC/AC:      1.20        1.09 - 1.39  AC:       150   mm     G. Age:  20w 1d         53  %    FL/BPD:     61.8   %  FL:       30.8  mm     G. Age:  19w 4d         26  %    FL/AC:      20.5   %    20 - 24  HUM:      30.3  mm     G. Age:  20w 0d         53  %  CER:      19.9  mm      G. Age:  19w 0d         25  %  CM:        5.2  mm  Est. FW:     326  gm    0 lb 11 oz      50  %     FW Discordancy         4  % ---------------------------------------------------------------------- OB History  Gravidity:    8         Term:   1         SAB:   3  TOP:          3        Living:  1 ---------------------------------------------------------------------- Gestational Age (Fetus A)  LMP:           21w 6d        Date:  06/10/18                 EDD:   03/17/19  U/S Today:     20w 2d                                        EDD:   03/28/19  Best:          20w 0d     Det. By:  Latoya Dubs  EDD:   03/30/19                                      (08/04/18) ---------------------------------------------------------------------- Anatomy (Fetus A)  Cranium:               Appears normal         LVOT:                   Appears normal  Cavum:                 Appears normal         Aortic Arch:            Appears normal  Ventricles:            Appears normal         Ductal Arch:            Appears normal  Choroid Plexus:        Appears normal         Diaphragm:              Appears normal  Cerebellum:            Appears normal         Stomach:                Appears normal, left                                                                        sided  Posterior Fossa:       Appears normal         Abdomen:                Appears normal  Nuchal Fold:           Appears normal         Abdominal Wall:         Appears nml (cord                                                                        insert, abd wall)  Face:                  Appears normal         Cord Vessels:           Appears normal (3                         (orbits and profile)                           vessel cord)  Lips:                  Appears normal         Kidneys:  Appear normal  Palate:                Appears normal         Bladder:                Appears normal  Thoracic:              Appears normal         Spine:                   Appears normal  Heart:                 Appears normal         Upper Extremities:      Appears normal                         (4CH, axis, and                         situs)  RVOT:                  Appears normal         Lower Extremities:      Appears normal  Other:  Heels visualized. Nasal bone visualized. ---------------------------------------------------------------------- Fetal Evaluation (Fetus B)  Num Of Fetuses:         2  Fetal Heart Rate(bpm):  141  Cardiac Activity:       Observed  Fetal Lie:              Upper Fetus  Presentation:           Transverse, head to maternal right  Placenta:               Posterior  P. Cord Insertion:      Not well visualized  Membrane Desc:      Dividing Membrane seen - Dichorionic.  Amniotic Fluid  AFI FV:      Within normal limits                              Largest Pocket(cm)                              6.3 ---------------------------------------------------------------------- Biometry (Fetus B)  BPD:      48.2  mm     G. Age:  20w 4d         73  %    CI:        72.89   %    70 - 86                                                          FL/HC:      17.8   %    16.8 - 19.8  HC:      179.5  mm     G. Age:  20w 2d         60  %    HC/AC:      1.18        1.09 - 1.39  AC:      151.6  mm     G. Age:  20w 3d         57  %    FL/BPD:     66.2   %  FL:       31.9  mm     G. Age:  20w 0d         40  %    FL/AC:      21.0   %    20 - 24  HUM:      30.9  mm     G. Age:  20w 2d         60  %  CER:      20.8  mm     G. Age:  19w 6d         46  %  NFT:       4.4  mm  Est. FW:     339  gm    0 lb 12 oz      53  %     FW Discordancy      0 \ 4 % ---------------------------------------------------------------------- Gestational Age (Fetus B)  LMP:           21w 6d        Date:  06/10/18                 EDD:   03/17/19  U/S Today:     20w 2d                                        EDD:   03/28/19  Best:          Cherylann Parr 0d     Det. ByMarcella Dubs         EDD:   03/30/19                                       (08/04/18) ---------------------------------------------------------------------- Anatomy (Fetus B)  Cranium:               Appears normal         LVOT:                   Appears normal  Cavum:                 Appears normal         Aortic Arch:            Appears normal  Ventricles:            Appears normal         Ductal Arch:            Appears normal  Choroid Plexus:        Appears normal         Diaphragm:              Appears normal  Cerebellum:            Appears normal         Stomach:                Appears normal, left  sided  Posterior Fossa:       Appears normal         Abdomen:                Appears normal  Nuchal Fold:           Appears normal         Abdominal Wall:         Appears nml (cord                                                                        insert, abd wall)  Face:                  Appears normal         Cord Vessels:           Appears normal (3                         (orbits and profile)                           vessel cord)  Lips:                  Appears normal         Kidneys:                Appear normal  Palate:                Not well visualized    Bladder:                Appears normal  Thoracic:              Appears normal         Spine:                  Appears normal  Heart:                 Appears normal         Upper Extremities:      Appears normal                         (4CH, axis, and                         situs)  RVOT:                  Appears normal         Lower Extremities:      Appears normal  Other:  Heels and 5th digit visualized. ---------------------------------------------------------------------- Cervix Uterus Adnexa  Cervix  Length:              4  cm.  Normal appearance by transabdominal scan.  Uterus  No abnormality visualized.  Left Ovary  Within normal limits.  Right Ovary  Within normal limits.  Cul De Sac  No free fluid seen.  Adnexa  No  abnormality visualized. ---------------------------------------------------------------------- Impression  Diamniotic twin pregnancy  History of cone knife cone procedure ---------------------------------------------------------------------- Recommendations  Follow up growth  in 4 weeks  Repeat cervial length in 2 weeks ----------------------------------------------------------------------               Latoya Landsman, MD Electronically Signed Final Report   11/10/2018 10:54 am ----------------------------------------------------------------------  [place] Mfm Ob Detail Addl Gest +14 Wk  Result Date: 11/10/2018 ----------------------------------------------------------------------  OBSTETRICS REPORT                       (Signed Final 11/10/2018 10:54 am) ---------------------------------------------------------------------- Patient Info  ID #:       [id number]                          D.O.B.:  [birth year] (32 yrs)  Name:       Latoya Palmer               Visit Date: 11/10/2018 08:04 am ---------------------------------------------------------------------- Performed By  Performed By:     Eden Lathe BS      Ref. Address:     [address]                    [address] [address]                                                             Rd  Attending:        Lin Palmer      Location:         [facility]                    MD                                       Fetal Care  Referred By:      Conan Bowens                    MD ---------------------------------------------------------------------- Orders   #  Description                          Code         Ordered By   1  [place] MFM OB DETAIL +14 WK              76811.01     KELLY Palmer   2  [place] MFM OB DETAIL ADDL GEST           78295.62     KELLY Palmer      +14 WK  ----------------------------------------------------------------------   #  Order #                    Accession #                 Episode #   1  [id number]                  [id number]                   [id number]   2  [id number]  [id number]                  [id number]  ---------------------------------------------------------------------- Indications   Encounter for antenatal screening for          Z36.3   malformations   Twin pregnancy, di/di, second trimester - low  O30.042   risk NIPS   Encounter for cervical length                  Z36.86   Previous cervical surgery (Cervical            O34.40   Conization with BX 2018)   [redacted] weeks gestation of pregnancy                Z3A.20  ---------------------------------------------------------------------- Fetal Evaluation (Fetus A)  Num Of Fetuses:         2  Fetal Heart Rate(bpm):  151  Cardiac Activity:       Observed  Fetal Lie:              Lower Fetus  Presentation:           Cephalic  Placenta:               Anterior  P. Cord Insertion:      Visualized  Membrane Desc:      Dividing Membrane seen - Dichorionic.  Amniotic Fluid  AFI FV:      Within normal limits                              Largest Pocket(cm)                              4 ---------------------------------------------------------------------- Biometry (Fetus A)  BPD:      49.8  mm     G. Age:  21w 0d         88  %    CI:        77.25   %    70 - 86                                                          FL/HC:      17.2   %    16.8 - 19.8  HC:      179.4  mm     G. Age:  20w 2d         60  %    HC/AC:      1.20        1.09 - 1.39  AC:       150   mm     G. Age:  20w 1d         53  %    FL/BPD:     61.8   %  FL:       30.8  mm     G. Age:  19w 4d         26  %    FL/AC:      20.5   %    20 - 24  HUM:      30.3  mm     G. Age:  35w 0d  53  %  CER:      19.9  mm     G. Age:  19w 0d         25  %  CM:        5.2  mm  Est. FW:     326  gm    0 lb 11 oz      50  %     FW Discordancy         4  % ---------------------------------------------------------------------- OB History  Gravidity:    8         Term:   1         SAB:   3  TOP:          3        Living:  1  ---------------------------------------------------------------------- Gestational Age (Fetus A)  LMP:           21w 6d        Date:  06/10/18                 EDD:   03/17/19  U/S Today:     20w 2d                                        EDD:   03/28/19  Best:          Cherylann Parr 0d     Det. ByMarcella Dubs         EDD:   03/30/19                                      (08/04/18) ---------------------------------------------------------------------- Anatomy (Fetus A)  Cranium:               Appears normal         LVOT:                   Appears normal  Cavum:                 Appears normal         Aortic Arch:            Appears normal  Ventricles:            Appears normal         Ductal Arch:            Appears normal  Choroid Plexus:        Appears normal         Diaphragm:              Appears normal  Cerebellum:            Appears normal         Stomach:                Appears normal, left                                                                        sided  Posterior  Fossa:       Appears normal         Abdomen:                Appears normal  Nuchal Fold:           Appears normal         Abdominal Wall:         Appears nml (cord                                                                        insert, abd wall)  Face:                  Appears normal         Cord Vessels:           Appears normal (3                         (orbits and profile)                           vessel cord)  Lips:                  Appears normal         Kidneys:                Appear normal  Palate:                Appears normal         Bladder:                Appears normal  Thoracic:              Appears normal         Spine:                  Appears normal  Heart:                 Appears normal         Upper Extremities:      Appears normal                         (4CH, axis, and                         situs)  RVOT:                  Appears normal         Lower Extremities:      Appears normal  Other:  Heels visualized.  Nasal bone visualized. ---------------------------------------------------------------------- Fetal Evaluation (Fetus B)  Num Of Fetuses:         2  Fetal Heart Rate(bpm):  141  Cardiac Activity:       Observed  Fetal Lie:              Upper Fetus  Presentation:           Transverse, head to maternal right  Placenta:  Posterior  P. Cord Insertion:      Not well visualized  Membrane Desc:      Dividing Membrane seen - Dichorionic.  Amniotic Fluid  AFI FV:      Within normal limits                              Largest Pocket(cm)                              6.3 ---------------------------------------------------------------------- Biometry (Fetus B)  BPD:      48.2  mm     G. Age:  20w 4d         73  %    CI:        72.89   %    70 - 86                                                          FL/HC:      17.8   %    16.8 - 19.8  HC:      179.5  mm     G. Age:  20w 2d         60  %    HC/AC:      1.18        1.09 - 1.39  AC:      151.6  mm     G. Age:  20w 3d         57  %    FL/BPD:     66.2   %  FL:       31.9  mm     G. Age:  20w 0d         40  %    FL/AC:      21.0   %    20 - 24  HUM:      30.9  mm     G. Age:  20w 2d         60  %  CER:      20.8  mm     G. Age:  19w 6d         46  %  NFT:       4.4  mm  Est. FW:     339  gm    0 lb 12 oz      53  %     FW Discordancy      0 \ 4 % ---------------------------------------------------------------------- Gestational Age (Fetus B)  LMP:           21w 6d        Date:  06/10/18                 EDD:   03/17/19  U/S Today:     20w 2d                                        EDD:   03/28/19  Best:          Spero Geralds  Det. By:  Latoya Dubs         EDD:   03/30/19                                      (08/04/18) ---------------------------------------------------------------------- Anatomy (Fetus B)  Cranium:               Appears normal         LVOT:                   Appears normal  Cavum:                 Appears normal         Aortic Arch:             Appears normal  Ventricles:            Appears normal         Ductal Arch:            Appears normal  Choroid Plexus:        Appears normal         Diaphragm:              Appears normal  Cerebellum:            Appears normal         Stomach:                Appears normal, left                                                                        sided  Posterior Fossa:       Appears normal         Abdomen:                Appears normal  Nuchal Fold:           Appears normal         Abdominal Wall:         Appears nml (cord                                                                        insert, abd wall)  Face:                  Appears normal         Cord Vessels:           Appears normal (3                         (orbits and profile)                           vessel cord)  Lips:  Appears normal         Kidneys:                Appear normal  Palate:                Not well visualized    Bladder:                Appears normal  Thoracic:              Appears normal         Spine:                  Appears normal  Heart:                 Appears normal         Upper Extremities:      Appears normal                         (4CH, axis, and                         situs)  RVOT:                  Appears normal         Lower Extremities:      Appears normal  Other:  Heels and 5th digit visualized. ---------------------------------------------------------------------- Cervix Uterus Adnexa  Cervix  Length:              4  cm.  Normal appearance by transabdominal scan.  Uterus  No abnormality visualized.  Left Ovary  Within normal limits.  Right Ovary  Within normal limits.  Cul De Sac  No free fluid seen.  Adnexa  No abnormality visualized. ---------------------------------------------------------------------- Impression  Diamniotic twin pregnancy  History of cone knife cone procedure ---------------------------------------------------------------------- Recommendations  Follow up growth in 4 weeks   Repeat cervial length in 2 weeks ----------------------------------------------------------------------               Latoya Landsman, MD Electronically Signed Final Report   11/10/2018 10:54 am ----------------------------------------------------------------------  [place] Mfm Ob Follow Up  Result Date: 12/09/2018 ----------------------------------------------------------------------  OBSTETRICS REPORT                       (Signed Final 12/09/2018 02:26 pm) ---------------------------------------------------------------------- Patient Info  ID #:       [id number]                          D.O.B.:  09/23/1985 (32 yrs)  Name:       Latoya Palmer               Visit Date: 12/09/2018 12:57 pm ---------------------------------------------------------------------- Performed By  Performed By:     Latoya Palmer          Ref. Address:     [address]                    [address]                                                             Rd  Attending:  Corenthian Booker      Location:         [facility]                    MD                                       Fetal Care  Referred By:      Conan Bowens                    MD ---------------------------------------------------------------------- Orders   #  Description                          Code         Ordered By   1  [place] MFM OB TRANSVAGINAL               96295.2      Latoya Palmer   2  [place] MFM OB FOLLOW UP                  84132.44     CORENTHIAN                                                        BOOKER   3  [place] MFM OB FOLLOW UP ADDL             01027.25     Latoya Palmer  ----------------------------------------------------------------------   #  Order #                    Accession #                 Episode #   1  [id number]                  [id number]   2  [id number]                  [id number]                   [id number]   3  [id number]                  [id number]                  [id number]  ---------------------------------------------------------------------- Indications   [redacted] weeks gestation of pregnancy  Z3A.24   Twin pregnancy, di/di, second trimester - low  O30.042   risk NIPS   Encounter for cervical length                  Z36.86   Previous cervical surgery (Cervical            O34.40   Conization with BX 2018)  ---------------------------------------------------------------------- Fetal Evaluation (Fetus A)  Num Of Fetuses:         2  Fetal Heart Rate(bpm):  130  Cardiac Activity:       Observed  Fetal Lie:              Lower Fetus  Presentation:           Cephalic  Placenta:               Anterior  P. Cord Insertion:      Visualized  Membrane Desc:      Dividing Membrane seen - Dichorionic.  Amniotic Fluid  AFI FV:      Within normal limits                              Largest Pocket(cm)                              3.89 ---------------------------------------------------------------------- Biometry (Fetus A)  BPD:      63.1  mm     G. Age:  25w 4d         88  %    CI:        77.38   %    70 - 86                                                          FL/HC:      17.9   %    18.7 - 20.9  HC:      227.1  mm     G. Age:  24w 5d         54  %    HC/AC:      1.07        1.05 - 1.21  AC:      211.5  mm     G. Age:  25w 5d         84  %    FL/BPD:     64.5   %    71 - 87  FL:       40.7  mm     G. Age:  23w 1d         13  %    FL/AC:      19.2   %    20 - 24  Est. FW:     723  gm    1 lb 10 oz      62  %     FW Discordancy      0 \ 4 % ---------------------------------------------------------------------- OB History  Gravidity:    8         Term:   1         SAB:   3  TOP:  3        Living:  1 ---------------------------------------------------------------------- Gestational Age (Fetus A)  LMP:           26w 0d        Date:  06/10/18                 EDD:   03/17/19  U/S Today:     24w 6d                                         EDD:   03/25/19  Best:          24w 1d     Det. By:  Latoya Dubs         EDD:   03/30/19                                      (08/04/18) ---------------------------------------------------------------------- Anatomy (Fetus A)  Cranium:               Appears normal         LVOT:                   Appears normal  Cavum:                 Appears normal         Aortic Arch:            Appears normal  Ventricles:            Previously seen        Ductal Arch:            Previously seen  Choroid Plexus:        Previously seen        Diaphragm:              Appears normal  Cerebellum:            Previously seen        Stomach:                Appears normal, left                                                                        sided  Posterior Fossa:       Previously seen        Abdomen:                Appears normal  Nuchal Fold:           Previously seen        Abdominal Wall:         Previously seen  Face:                  Orbits and profile     Cord Vessels:           Previously seen                         previously seen  Lips:                  Previously seen        Kidneys:                Left sided                                                                        pyelectasis,4.3 mm  Palate:                Previously seen        Bladder:                Appears normal  Thoracic:              Appears normal         Spine:                  Appears normal  Heart:                 Appears normal         Upper Extremities:      Previously seen                         (4CH, axis, and                         situs)  RVOT:                  Appears normal         Lower Extremities:      Previously seen  Other:  Heels visualized. Nasal bone visualized. ---------------------------------------------------------------------- Fetal Evaluation (Fetus B)  Num Of Fetuses:         2  Fetal Heart Rate(bpm):  155  Cardiac Activity:       Observed  Fetal Lie:              Upper Fetus   Presentation:           Variable  Placenta:               Posterior  Membrane Desc:      Dividing Membrane seen - Dichorionic.  Amniotic Fluid  AFI FV:      Within normal limits                              Largest Pocket(cm)                              5.54 ---------------------------------------------------------------------- Biometry (Fetus B)  BPD:      61.3  mm     G. Age:  24w 6d         71  %    CI:        75.33   %    70 - 86  FL/HC:      19.4   %    18.7 - 20.9  HC:       224   mm     G. Age:  24w 3d         43  %    HC/AC:      1.13        1.05 - 1.21  AC:       198   mm     G. Age:  24w 4d         51  %    FL/BPD:     70.8   %    71 - 87  FL:       43.4  mm     G. Age:  24w 2d         40  %    FL/AC:      21.9   %    20 - 24  LV:        5.6  mm  Est. FW:     692  gm      1 lb 8 oz     57  %     FW Discordancy         4  % ---------------------------------------------------------------------- Gestational Age (Fetus B)  LMP:           26w 0d        Date:  06/10/18                 EDD:   03/17/19  U/S Today:     24w 4d                                        EDD:   03/27/19  Best:          24w 1d     Det. ByMarcella Dubs         EDD:   03/30/19                                      (08/04/18) ---------------------------------------------------------------------- Anatomy (Fetus B)  Cranium:               Appears normal         Aortic Arch:            Previously seen  Cavum:                 Appears normal         Ductal Arch:            Previously seen  Ventricles:            Appears normal         Diaphragm:              Appears normal  Choroid Plexus:        Previously seen        Stomach:                Appears normal, left  sided  Cerebellum:            Previously seen        Abdomen:                Appears normal  Posterior Fossa:       Previously seen        Abdominal Wall:          Previously seen  Nuchal Fold:           Previously seen        Cord Vessels:           Previously seen  Face:                  Orbits and profile     Kidneys:                Appear normal                         previously seen  Lips:                  Previously seen        Bladder:                Appears normal  Thoracic:              Appears normal         Spine:                  Previously seen  Heart:                 Appears normal         Upper Extremities:      Previously seen                         (4CH, axis, and                         situs)  RVOT:                  Appears normal         Lower Extremities:      Previously seen  LVOT:                  Appears normal  Other:  Heels and 5th digit visualized. ---------------------------------------------------------------------- Cervix Uterus Adnexa  Cervix  Length:           3.08  cm.  Normal appearance by transvaginal scan  Uterus  No abnormality visualized.  Left Ovary  Not visualized.  Right Ovary  Not visualized.  Adnexa  No abnormality visualized. ---------------------------------------------------------------------- Impression  Diamniotic dichorionic  Normal cervical length ---------------------------------------------------------------------- Recommendations  Follow up growth in 4 weeks.-previously scheduled.  No further  CL required at this time. ----------------------------------------------------------------------               Latoya Landsman, MD Electronically Signed Final Report   12/09/2018 02:26 pm ----------------------------------------------------------------------  [place] Mfm Ob Follow Up Addl Gest  Result Date: 12/09/2018 ----------------------------------------------------------------------  OBSTETRICS REPORT                       (Signed Final 12/09/2018 02:26 pm) ---------------------------------------------------------------------- Patient Info  ID #:       [id number]  D.O.B.:  [birth year] (32 yrs)  Name:        Latoya Palmer               Visit Date: 12/09/2018 12:57 pm ---------------------------------------------------------------------- Performed By  Performed By:     Latoya Palmer          Ref. Address:     [address]                    [address]                                                             Rd  Attending:        Lin Palmer      Location:         [facility]                    MD                                       Fetal Care  Referred By:      Conan Bowens                    MD ---------------------------------------------------------------------- Orders   #  Description                          Code         Ordered By   1  [place] MFM OB TRANSVAGINAL               60454.0      Latoya Palmer   2  [place] MFM OB FOLLOW UP                  76816.01     CORENTHIAN                                                        BOOKER   3  [place] MFM OB FOLLOW UP ADDL             98119.14     Latoya Palmer  ----------------------------------------------------------------------   #  Order #                    Accession #  Episode #   1  [id number]                  [id number]                  [id number]   2  [id number]                  [id number]                  [id number]   3  [id number]                  [id number]                  [id number]  ---------------------------------------------------------------------- Indications   [redacted] weeks gestation of pregnancy                Z3A.24   Twin pregnancy, di/di, second trimester - low  O30.042   risk NIPS   Encounter for cervical length                  Z36.86   Previous cervical surgery (Cervical            O34.40   Conization with BX 2018)  ---------------------------------------------------------------------- Fetal Evaluation (Fetus A)  Num Of Fetuses:         2  Fetal Heart Rate(bpm):  130  Cardiac Activity:       Observed  Fetal  Lie:              Lower Fetus  Presentation:           Cephalic  Placenta:               Anterior  P. Cord Insertion:      Visualized  Membrane Desc:      Dividing Membrane seen - Dichorionic.  Amniotic Fluid  AFI FV:      Within normal limits                              Largest Pocket(cm)                              3.89 ---------------------------------------------------------------------- Biometry (Fetus A)  BPD:      63.1  mm     G. Age:  25w 4d         88  %    CI:        77.38   %    70 - 86                                                          FL/HC:      17.9   %    18.7 - 20.9  HC:      227.1  mm     G. Age:  24w 5d         54  %    HC/AC:      1.07        1.05 - 1.21  AC:      211.5  mm     G. Age:  25w 5d  84  %    FL/BPD:     64.5   %    71 - 87  FL:       40.7  mm     G. Age:  23w 1d         13  %    FL/AC:      19.2   %    20 - 24  Est. FW:     723  gm    1 lb 10 oz      62  %     FW Discordancy      0 \ 4 % ---------------------------------------------------------------------- OB History  Gravidity:    8         Term:   1         SAB:   3  TOP:          3        Living:  1 ---------------------------------------------------------------------- Gestational Age (Fetus A)  LMP:           26w 0d        Date:  06/10/18                 EDD:   03/17/19  U/S Today:     24w 6d                                        EDD:   03/25/19  Best:          24w 1d     Det. ByMarcella Dubs         EDD:   03/30/19                                      (08/04/18) ---------------------------------------------------------------------- Anatomy (Fetus A)  Cranium:               Appears normal         LVOT:                   Appears normal  Cavum:                 Appears normal         Aortic Arch:            Appears normal  Ventricles:            Previously seen        Ductal Arch:            Previously seen  Choroid Plexus:        Previously seen        Diaphragm:              Appears normal  Cerebellum:             Previously seen        Stomach:                Appears normal, left  sided  Posterior Fossa:       Previously seen        Abdomen:                Appears normal  Nuchal Fold:           Previously seen        Abdominal Wall:         Previously seen  Face:                  Orbits and profile     Cord Vessels:           Previously seen                         previously seen  Lips:                  Previously seen        Kidneys:                Left sided                                                                        pyelectasis,4.3 mm  Palate:                Previously seen        Bladder:                Appears normal  Thoracic:              Appears normal         Spine:                  Appears normal  Heart:                 Appears normal         Upper Extremities:      Previously seen                         (4CH, axis, and                         situs)  RVOT:                  Appears normal         Lower Extremities:      Previously seen  Other:  Heels visualized. Nasal bone visualized. ---------------------------------------------------------------------- Fetal Evaluation (Fetus B)  Num Of Fetuses:         2  Fetal Heart Rate(bpm):  155  Cardiac Activity:       Observed  Fetal Lie:              Upper Fetus  Presentation:           Variable  Placenta:               Posterior  Membrane Desc:      Dividing Membrane seen - Dichorionic.  Amniotic Fluid  AFI FV:      Within normal limits  Largest Pocket(cm)                              5.54 ---------------------------------------------------------------------- Biometry (Fetus B)  BPD:      61.3  mm     G. Age:  24w 6d         71  %    CI:        75.33   %    70 - 86                                                          FL/HC:      19.4   %    18.7 - 20.9  HC:       224   mm     G. Age:  24w 3d         43  %    HC/AC:      1.13        1.05 - 1.21  AC:        198   mm     G. Age:  24w 4d         51  %    FL/BPD:     70.8   %    71 - 87  FL:       43.4  mm     G. Age:  24w 2d         40  %    FL/AC:      21.9   %    20 - 24  LV:        5.6  mm  Est. FW:     692  gm      1 lb 8 oz     57  %     FW Discordancy         4  % ---------------------------------------------------------------------- Gestational Age (Fetus B)  LMP:           26w 0d        Date:  06/10/18                 EDD:   03/17/19  U/S Today:     24w 4d                                        EDD:   03/27/19  Best:          24w 1d     Det. By:  Latoya Dubs         EDD:   03/30/19                                      (08/04/18) ---------------------------------------------------------------------- Anatomy (Fetus B)  Cranium:               Appears normal         Aortic Arch:            Previously seen  Cavum:  Appears normal         Ductal Arch:            Previously seen  Ventricles:            Appears normal         Diaphragm:              Appears normal  Choroid Plexus:        Previously seen        Stomach:                Appears normal, left                                                                        sided  Cerebellum:            Previously seen        Abdomen:                Appears normal  Posterior Fossa:       Previously seen        Abdominal Wall:         Previously seen  Nuchal Fold:           Previously seen        Cord Vessels:           Previously seen  Face:                  Orbits and profile     Kidneys:                Appear normal                         previously seen  Lips:                  Previously seen        Bladder:                Appears normal  Thoracic:              Appears normal         Spine:                  Previously seen  Heart:                 Appears normal         Upper Extremities:      Previously seen                         (4CH, axis, and                         situs)  RVOT:                  Appears normal         Lower Extremities:       Previously seen  LVOT:                  Appears normal  Other:  Heels and 5th digit visualized. ---------------------------------------------------------------------- Cervix  Uterus Adnexa  Cervix  Length:           3.08  cm.  Normal appearance by transvaginal scan  Uterus  No abnormality visualized.  Left Ovary  Not visualized.  Right Ovary  Not visualized.  Adnexa  No abnormality visualized. ---------------------------------------------------------------------- Impression  Diamniotic dichorionic  Normal cervical length ---------------------------------------------------------------------- Recommendations  Follow up growth in 4 weeks.-previously scheduled.  No further  CL required at this time. ----------------------------------------------------------------------               Latoya Landsman, MD Electronically Signed Final Report   12/09/2018 02:26 pm ----------------------------------------------------------------------  Assessment and Plan:  Pregnancy: [id number] at [redacted]w[redacted]d 1. Vaginal discharge during pregnancy in second trimester Will follow up test results and manage accordingly. Proper vulvar hygiene emphasized: discussed avoidance of perfumed soaps, detergents, lotions and any type of douches; in addition to wearing cotton underwear and no underwear at night.  Also recommended cleaning front to back, voiding and cleaning up after intercourse.  - Cervicovaginal ancillary only( [place])  2. Dichorionic diamniotic twin pregnancy in second trimester Concordant growth on today's scan. Will continue to follow.  3. Supervision of high risk pregnancy, antepartum Preterm labor symptoms and general obstetric precautions including but not limited to vaginal bleeding, contractions, leaking of fluid and fetal movement were reviewed in detail with the patient. Please refer to After Visit Summary for other counseling recommendations.   Return in about 2 weeks (around 12/23/2018) for Virtual OB Visit   4  week: 2 hr GTT, 3rd trimester labs, TDap, OFFICE HOB Visit.  Future Appointments  Date Time Provider Department Center  01/06/2019  9:00 AM [facility] NURSE [facility]-US  01/06/2019  9:00 AM [facility] [place] 3 [facility]-M[facility] MFC-US    Jaynie Collins, MD

## 2018-12-09 NOTE — Patient Instructions (Signed)
Return to office for any scheduled appointments. Call the office or go to the MAU at [facility] [place] if:  You begin to have strong, frequent contractions  Your water breaks.  Sometimes it is a big gush of fluid, sometimes it is just a trickle that keeps getting your panties wet or running down your legs  You have vaginal bleeding.  It is normal to have a small amount of spotting if your cervix was checked.   You do not feel your baby moving like normal.  If you do not, get something to eat and drink and lay down and focus on feeling your baby move.   If your baby is still not moving like normal, you should call the office or go to MAU.  Any other obstetric concerns.   

## 2018-12-10 LAB — CERVICOVAGINAL ANCILLARY ONLY
Bacterial vaginitis: NEGATIVE
Candida vaginitis: POSITIVE — AB
Chlamydia: NEGATIVE
Neisseria Gonorrhea: NEGATIVE
Trichomonas: NEGATIVE

## 2018-12-11 ENCOUNTER — Telehealth: Payer: Self-pay | Admitting: *Deleted

## 2018-12-11 MED ORDER — TERCONAZOLE 0.8 % VA CREA: 1.0000 | TOPICAL_CREAM | Freq: Every day | VAGINAL | 0 refills | Status: DC

## 2018-12-11 NOTE — Telephone Encounter (Signed)
Received notification from Dr. Macon Large and I called Latoya Palmer and notified her Dr. Macon Large had sent her MyChart message- she has not read yet. I informed her she has a yeast infection and rx sent to her pharmacy- reviewed instructions. She voices understanding.

## 2018-12-11 NOTE — Addendum Note (Signed)
Addended by: Jaynie Collins A on: 12/11/2018 07:54 AM   Modules accepted: Orders

## 2018-12-24 ENCOUNTER — Telehealth (INDEPENDENT_AMBULATORY_CARE_PROVIDER_SITE_OTHER): Payer: Medicaid Other | Admitting: Obstetrics and Gynecology

## 2018-12-24 ENCOUNTER — Other Ambulatory Visit: Payer: Self-pay

## 2018-12-24 ENCOUNTER — Encounter: Payer: Self-pay | Admitting: Obstetrics and Gynecology

## 2018-12-24 VITALS — BP 107/72 | HR 88 | Wt 166.4 lb

## 2018-12-24 DIAGNOSIS — O3441 Maternal care for other abnormalities of cervix, first trimester: Secondary | ICD-10-CM

## 2018-12-24 DIAGNOSIS — O3442 Maternal care for other abnormalities of cervix, second trimester: Secondary | ICD-10-CM

## 2018-12-24 DIAGNOSIS — O30042 Twin pregnancy, dichorionic/diamniotic, second trimester: Secondary | ICD-10-CM

## 2018-12-24 DIAGNOSIS — G5603 Carpal tunnel syndrome, bilateral upper limbs: Secondary | ICD-10-CM

## 2018-12-24 DIAGNOSIS — Z3A26 26 weeks gestation of pregnancy: Secondary | ICD-10-CM

## 2018-12-24 DIAGNOSIS — O0992 Supervision of high risk pregnancy, unspecified, second trimester: Secondary | ICD-10-CM

## 2018-12-24 DIAGNOSIS — O0990 Supervision of high risk pregnancy, unspecified, unspecified trimester: Secondary | ICD-10-CM

## 2018-12-24 DIAGNOSIS — O99012 Anemia complicating pregnancy, second trimester: Secondary | ICD-10-CM

## 2018-12-24 NOTE — Progress Notes (Signed)
I connected with  CORRI MASCARI on 12/24/18 at  2:55 PM EDT by telephone and verified that I am speaking with the correct person using two identifiers.   I discussed the limitations, risks, security and privacy concerns of performing an evaluation and management service by telephone and the availability of in person appointments. I also discussed with the patient that there may be a patient responsible charge related to this service. The patient expressed understanding and agreed to proceed.  SHAMORA TAUNTON, RN 12/24/2018  3:14 PM

## 2018-12-24 NOTE — Progress Notes (Signed)
TELEHEALTH OBSTETRICS PRENATAL VIRTUAL VIDEO VISIT ENCOUNTER NOTE  Provider location: [facility] [employer] [facility]   I connected with Latoya Palmer on 12/24/18 at  2:55 PM EDT by MyChart Video Encounter at home and verified that I am speaking with the correct person using two identifiers.   I discussed the limitations, risks, security and privacy concerns of performing an evaluation and management service by telephone and the availability of in person appointments. I also discussed with the patient that there may be a patient responsible charge related to this service. The patient expressed understanding and agreed to proceed. Subjective:  Latoya Palmer is a 33 y.o. [phone] at [redacted]w[redacted]d being seen today for ongoing prenatal care.  She is currently monitored for the following issues for this high-risk pregnancy and has History of cervical cone biopsy affecting care of mother in first trimester, antepartum; Dichorionic diamniotic twin gestation; Supervision of high risk pregnancy, antepartum; and Anemia in pregnancy on their problem list.  Patient reports carpal tunnel symptoms. Numbness in hands, feels like she has to change positions. Also has some swelling in her feet. Contractions: Irregular. Vag. Bleeding: None.  Movement: Present. Denies any leaking of fluid.   The following portions of the patient's history were reviewed and updated as appropriate: allergies, current medications, past family history, past medical history, past social history, past surgical history and problem list.   Objective:   Vitals:   12/24/18 1515  BP: 107/72  Pulse: 88  Weight: 166 lb 6.4 oz (75.5 kg)    Fetal Status:     Movement: Present     General:  Alert, oriented and cooperative. Patient is in no acute distress.  Respiratory: Normal respiratory effort, no problems with respiration noted  Mental Status: Normal mood and affect. Normal behavior. Normal judgment and thought content.   Rest of physical exam deferred due to type of encounter   Assessment and Plan:  Pregnancy: [id number] at [redacted]w[redacted]d  1. Supervision of high risk pregnancy, antepartum Next visit 2 hr GTT, 3rd trim Reviewed covid, precautions hospital is taking, etc  2. Dichorionic diamniotic twin pregnancy in second trimester Growth discordance 4% Next growth [place] 6/10  3. History of cervical cone biopsy affecting care of mother in first trimester, antepartum Normal CL, no further indicated  4. Anemia during pregnancy in second trimester  5. Bilateral carpal tunnel syndrome Encouraged switching position as needed and use of wrist braces   Preterm labor symptoms and general obstetric precautions including but not limited to vaginal bleeding, contractions, leaking of fluid and fetal movement were reviewed in detail with the patient. I discussed the assessment and treatment plan with the patient. The patient was provided an opportunity to ask questions and all were answered. The patient agreed with the plan and demonstrated an understanding of the instructions. The patient was advised to call back or seek an in-person office evaluation/go to MAU at [facility] for any urgent or concerning symptoms. Please refer to After Visit Summary for other counseling recommendations.   I provided 16 minutes of face-to-face time during this encounter.  Return in about 2 weeks (around 01/07/2019) for OB visit (MD), in person, 3rd trim labs, 2 hr GTT.  Future Appointments  Date Time Provider Department Center  01/06/2019  8:15 AM Conan Bowens, MD WOC-WOCA WOC  01/06/2019  9:00 AM [facility] NURSE [facility]-MFC MFC-US  01/06/2019  9:00 AM [facility]-MFC [place] 3 WH-MFCUS MFC-US  01/06/2019 10:00 AM WOC-WOCA LAB [facility]-W[facility]A WOC  Sloan Leiter, MD Center for [place], [place]

## 2019-01-02 ENCOUNTER — Other Ambulatory Visit: Payer: Self-pay | Admitting: Obstetrics & Gynecology

## 2019-01-04 ENCOUNTER — Other Ambulatory Visit: Payer: Self-pay | Admitting: *Deleted

## 2019-01-04 ENCOUNTER — Other Ambulatory Visit: Payer: Self-pay | Admitting: Obstetrics & Gynecology

## 2019-01-04 DIAGNOSIS — O0990 Supervision of high risk pregnancy, unspecified, unspecified trimester: Secondary | ICD-10-CM

## 2019-01-05 ENCOUNTER — Telehealth: Payer: Self-pay | Admitting: Family Medicine

## 2019-01-05 NOTE — Telephone Encounter (Signed)
Called and spoke to patient instructed about wearing face covering, patient aware no visitors are allowed due to COVID19, ask patient about having any symptoms and patient said NO.   °

## 2019-01-06 ENCOUNTER — Other Ambulatory Visit (HOSPITAL_COMMUNITY): Payer: Self-pay | Admitting: *Deleted

## 2019-01-06 ENCOUNTER — Ambulatory Visit (HOSPITAL_COMMUNITY): Payer: Medicaid Other | Admitting: *Deleted

## 2019-01-06 ENCOUNTER — Other Ambulatory Visit: Payer: Self-pay

## 2019-01-06 ENCOUNTER — Other Ambulatory Visit: Payer: Medicaid Other

## 2019-01-06 ENCOUNTER — Ambulatory Visit (INDEPENDENT_AMBULATORY_CARE_PROVIDER_SITE_OTHER): Payer: Medicaid Other | Admitting: Obstetrics and Gynecology

## 2019-01-06 ENCOUNTER — Encounter (HOSPITAL_COMMUNITY): Payer: Self-pay

## 2019-01-06 ENCOUNTER — Encounter: Payer: Self-pay | Admitting: Obstetrics and Gynecology

## 2019-01-06 ENCOUNTER — Ambulatory Visit (HOSPITAL_COMMUNITY)
Admission: RE | Admit: 2019-01-06 | Discharge: 2019-01-06 | Disposition: A | Discharge status: Home / Self Care | Payer: Medicaid Other | Source: Ambulatory Visit | Attending: Obstetrics and Gynecology | Admitting: Obstetrics and Gynecology

## 2019-01-06 VITALS — BP 111/68 | HR 84 | Temp 97.8°F | Wt 171.0 lb

## 2019-01-06 DIAGNOSIS — O3441 Maternal care for other abnormalities of cervix, first trimester: Secondary | ICD-10-CM

## 2019-01-06 DIAGNOSIS — O30049 Twin pregnancy, dichorionic/diamniotic, unspecified trimester: Secondary | ICD-10-CM

## 2019-01-06 DIAGNOSIS — O3440 Maternal care for other abnormalities of cervix, unspecified trimester: Secondary | ICD-10-CM | POA: Diagnosis not present

## 2019-01-06 DIAGNOSIS — O0990 Supervision of high risk pregnancy, unspecified, unspecified trimester: Secondary | ICD-10-CM

## 2019-01-06 DIAGNOSIS — R011 Cardiac murmur, unspecified: Secondary | ICD-10-CM

## 2019-01-06 DIAGNOSIS — O99013 Anemia complicating pregnancy, third trimester: Secondary | ICD-10-CM

## 2019-01-06 DIAGNOSIS — O30043 Twin pregnancy, dichorionic/diamniotic, third trimester: Secondary | ICD-10-CM | POA: Diagnosis not present

## 2019-01-06 DIAGNOSIS — O3443 Maternal care for other abnormalities of cervix, third trimester: Secondary | ICD-10-CM | POA: Diagnosis not present

## 2019-01-06 DIAGNOSIS — O30042 Twin pregnancy, dichorionic/diamniotic, second trimester: Secondary | ICD-10-CM | POA: Diagnosis not present

## 2019-01-06 DIAGNOSIS — Z362 Encounter for other antenatal screening follow-up: Secondary | ICD-10-CM

## 2019-01-06 DIAGNOSIS — Z23 Encounter for immunization: Secondary | ICD-10-CM

## 2019-01-06 DIAGNOSIS — Z3A28 28 weeks gestation of pregnancy: Secondary | ICD-10-CM

## 2019-01-06 MED ORDER — FAMOTIDINE 20 MG PO TABS: 20.0000 mg | ORAL_TABLET | Freq: Every day | ORAL | 1 refills | Status: DC

## 2019-01-06 MED ORDER — PROMETHAZINE HCL 25 MG PO TABS: 25.0000 mg | ORAL_TABLET | Freq: Four times a day (QID) | ORAL | 2 refills | Status: DC | PRN

## 2019-01-06 NOTE — Patient Instructions (Signed)
Use the following websites (and others) to help learn more about your contraception options and find the method that is right for you!  - The Centers for Disease Control (CDC) website: TransferLive.sehttps://www.cdc.gov/reproductivehealth/contraception/index.htm  - [facility] website: https://www.plannedparenthood.org/learn/birth-control  - Bedsider.org: https://www.bedsider.org/methods   Levonorgestrel intrauterine device (IUD) What is this medicine? LEVONORGESTREL IUD (LEE voe nor jes trel) is a contraceptive (birth control) device. The device is placed inside the uterus by a healthcare professional. It is used to prevent pregnancy. This device can also be used to treat heavy bleeding that occurs during your period. This medicine may be used for other purposes; ask your health care provider or pharmacist if you have questions. COMMON BRAND NAME(S): Cameron AliKyleena, LILETTA, Mirena, Skyla What should I tell my health care provider before I take this medicine? They need to know if you have any of these conditions: -abnormal Pap smear -cancer of the breast, uterus, or cervix -diabetes -endometritis -genital or pelvic infection now or in the past -have more than one sexual partner or your partner has more than one partner -heart disease -history of an ectopic or tubal pregnancy -immune system problems -IUD in place -liver disease or tumor -problems with blood clots or take blood-thinners -seizures -use intravenous drugs -uterus of unusual shape -vaginal bleeding that has not been explained -an unusual or allergic reaction to levonorgestrel, other hormones, silicone, or polyethylene, medicines, foods, dyes, or preservatives -pregnant or trying to get pregnant -breast-feeding How should I use this medicine? This device is placed inside the uterus by a health care professional. Talk to your pediatrician regarding the use of this medicine in children. Special care may be needed. Overdosage: If you  think you have taken too much of this medicine contact a poison control center or emergency room at once. NOTE: This medicine is only for you. Do not share this medicine with others. What if I miss a dose? This does not apply. Depending on the brand of device you have inserted, the device will need to be replaced every 3 to 5 years if you wish to continue using this type of birth control. What may interact with this medicine? Do not take this medicine with any of the following medications: -amprenavir -bosentan -fosamprenavir This medicine may also interact with the following medications: -aprepitant -armodafinil -barbiturate medicines for inducing sleep or treating seizures -bexarotene -boceprevir -griseofulvin -medicines to treat seizures like carbamazepine, ethotoin, felbamate, oxcarbazepine, phenytoin, topiramate -modafinil -pioglitazone -rifabutin -rifampin -rifapentine -some medicines to treat HIV infection like atazanavir, efavirenz, indinavir, lopinavir, nelfinavir, tipranavir, ritonavir -St. John's wort -warfarin This list may not describe all possible interactions. Give your health care provider a list of all the medicines, herbs, non-prescription drugs, or dietary supplements you use. Also tell them if you smoke, drink alcohol, or use illegal drugs. Some items may interact with your medicine. What should I watch for while using this medicine? Visit your doctor or health care professional for regular check ups. See your doctor if you or your partner has sexual contact with others, becomes HIV positive, or gets a sexual transmitted disease. This product does not protect you against HIV infection (AIDS) or other sexually transmitted diseases. You can check the placement of the IUD yourself by reaching up to the top of your vagina with clean fingers to feel the threads. Do not pull on the threads. It is a good habit to check placement after each menstrual period. Call your doctor  right away if you feel more of the IUD than just the  threads or if you cannot feel the threads at all. The IUD may come out by itself. You may become pregnant if the device comes out. If you notice that the IUD has come out use a backup birth control method like condoms and call your health care provider. Using tampons will not change the position of the IUD and are okay to use during your period. This IUD can be safely scanned with magnetic resonance imaging (MRI) only under specific conditions. Before you have an MRI, tell your healthcare provider that you have an IUD in place, and which type of IUD you have in place. What side effects may I notice from receiving this medicine? Side effects that you should report to your doctor or health care professional as soon as possible: -allergic reactions like skin rash, itching or hives, swelling of the face, lips, or tongue -fever, flu-like symptoms -genital sores -high blood pressure -no menstrual period for 6 weeks during use -pain, swelling, warmth in the leg -pelvic pain or tenderness -severe or sudden headache -signs of pregnancy -stomach cramping -sudden shortness of breath -trouble with balance, talking, or walking -unusual vaginal bleeding, discharge -yellowing of the eyes or skin Side effects that usually do not require medical attention (report to your doctor or health care professional if they continue or are bothersome): -acne -breast pain -change in sex drive or performance -changes in weight -cramping, dizziness, or faintness while the device is being inserted -headache -irregular menstrual bleeding within first 3 to 6 months of use -nausea This list may not describe all possible side effects. Call your doctor for medical advice about side effects. You may report side effects to FDA at 1-800-FDA-1088. Where should I keep my medicine? This does not apply. NOTE: This sheet is a summary. It may not cover all possible information.  If you have questions about this medicine, talk to your doctor, pharmacist, or health care provider.  2019 Elsevier/Gold Standard (2016-04-26 14:14:56)  Etonogestrel implant What is this medicine? ETONOGESTREL (et oh noe JES trel) is a contraceptive (birth control) device. It is used to prevent pregnancy. It can be used for up to 3 years. This medicine may be used for other purposes; ask your health care provider or pharmacist if you have questions. COMMON BRAND NAME(S): Implanon, Nexplanon What should I tell my health care provider before I take this medicine? They need to know if you have any of these conditions: -abnormal vaginal bleeding -blood vessel disease or blood clots -breast, cervical, endometrial, ovarian, liver, or uterine cancer -diabetes -gallbladder disease -heart disease or recent heart attack -high blood pressure -high cholesterol or triglycerides -kidney disease -liver disease -migraine headaches -seizures -stroke -tobacco smoker -an unusual or allergic reaction to etonogestrel, anesthetics or antiseptics, other medicines, foods, dyes, or preservatives -pregnant or trying to get pregnant -breast-feeding How should I use this medicine? This device is inserted just under the skin on the inner side of your upper arm by a health care professional. Talk to your pediatrician regarding the use of this medicine in children. Special care may be needed. Overdosage: If you think you have taken too much of this medicine contact a poison control center or emergency room at once. NOTE: This medicine is only for you. Do not share this medicine with others. What if I miss a dose? This does not apply. What may interact with this medicine? Do not take this medicine with any of the following medications: -amprenavir -fosamprenavir This medicine may also interact with the following  medications: -acitretin -aprepitant -armodafinil -bexarotene -bosentan -carbamazepine -certain medicines for fungal infections like fluconazole, ketoconazole, itraconazole and voriconazole -certain medicines to treat hepatitis, HIV or AIDS -cyclosporine -felbamate -griseofulvin -lamotrigine -modafinil -oxcarbazepine -phenobarbital -phenytoin -primidone -rifabutin -rifampin -rifapentine -St. John's wort -topiramate This list may not describe all possible interactions. Give your health care provider a list of all the medicines, herbs, non-prescription drugs, or dietary supplements you use. Also tell them if you smoke, drink alcohol, or use illegal drugs. Some items may interact with your medicine. What should I watch for while using this medicine? This product does not protect you against HIV infection (AIDS) or other sexually transmitted diseases. You should be able to feel the implant by pressing your fingertips over the skin where it was inserted. Contact your doctor if you cannot feel the implant, and use a non-hormonal birth control method (such as condoms) until your doctor confirms that the implant is in place. Contact your doctor if you think that the implant may have broken or become bent while in your arm. You will receive a user card from your health care provider after the implant is inserted. The card is a record of the location of the implant in your upper arm and when it should be removed. Keep this card with your health records. What side effects may I notice from receiving this medicine? Side effects that you should report to your doctor or health care professional as soon as possible: -allergic reactions like skin rash, itching or hives, swelling of the face, lips, or tongue -breast lumps, breast tissue changes, or discharge -breathing problems -changes in emotions or moods -if you feel that the implant may have broken or bent while in your arm -high blood  pressure -pain, irritation, swelling, or bruising at the insertion site -scar at site of insertion -signs of infection at the insertion site such as fever, and skin redness, pain or discharge -signs and symptoms of a blood clot such as breathing problems; changes in vision; chest pain; severe, sudden headache; pain, swelling, warmth in the leg; trouble speaking; sudden numbness or weakness of the face, arm or leg -signs and symptoms of liver injury like dark yellow or brown urine; general ill feeling or flu-like symptoms; light-colored stools; loss of appetite; nausea; right upper belly pain; unusually weak or tired; yellowing of the eyes or skin -unusual vaginal bleeding, discharge Side effects that usually do not require medical attention (report to your doctor or health care professional if they continue or are bothersome): -acne -breast pain or tenderness -headache -irregular menstrual bleeding -nausea This list may not describe all possible side effects. Call your doctor for medical advice about side effects. You may report side effects to FDA at 1-800-FDA-1088. Where should I keep my medicine? This drug is given in a hospital or clinic and will not be stored at home. NOTE: This sheet is a summary. It may not cover all possible information. If you have questions about this medicine, talk to your doctor, pharmacist, or health care provider.  2019 Elsevier/Gold Standard (2017-06-03 14:11:42)

## 2019-01-06 NOTE — Progress Notes (Signed)
   PRENATAL VISIT NOTE  Subjective:  Latoya Palmer is a 33 y.o. [phone] at [redacted]w[redacted]d being seen today for ongoing prenatal care.  She is currently monitored for the following issues for this high-risk pregnancy and has History of cervical cone biopsy affecting care of mother in first trimester, antepartum; Dichorionic diamniotic twin gestation; Supervision of high risk pregnancy, antepartum; and Anemia in pregnancy on their problem list.  Patient reports heartburn and occasional contractions.  Contractions: Irregular. Vag. Bleeding: None.  Movement: Present. Denies leaking of fluid.   The following portions of the patient's history were reviewed and updated as appropriate: allergies, current medications, past family history, past medical history, past social history, past surgical history and problem list.   Objective:   Vitals:   01/06/19 0822  BP: 111/68  Pulse: 84  Temp: 97.8 F (36.6 C)  Weight: 171 lb (77.6 kg)    Fetal Status: Fetal Heart Rate (bpm): 143/152   Movement: Present     General:  Alert, oriented and cooperative. Patient is in no acute distress.  Skin: Skin is warm and dry. No rash noted.   Cardiovascular: Normal heart rate noted  Respiratory: Normal respiratory effort, no problems with respiration noted  Abdomen: Soft, gravid, appropriate for gestational age.  Pain/Pressure: Present     Pelvic: Cervical exam deferred        Extremities: Normal range of motion.  Edema: Trace  Mental Status: Normal mood and affect. Normal behavior. Normal judgment and thought content.   Assessment and Plan:  Pregnancy: [id number] at [redacted]w[redacted]d  1. Supervision of high risk pregnancy, antepartum 3rd trim labs 2 hr GTT today - undecided regarding contraception, to   2. Dichorionic diamniotic twin pregnancy in third trimester Growth [place] today - reviewed plan for delivery, recommendation for vaginal delivery but would be pending twin position, reviewed if Twin A is breech, would recommend  primary c-section, otherwise, would be okay for vaginal delivery, reviewed breech extraction, she verbalizes understanding  3. History of cervical cone biopsy affecting care of mother in first trimester, antepartum  4. Anemia during pregnancy in third trimester CBC today  5. Heart murmur - Patient reports history of heart murmur requiring Holter monitor due to "heart skipping" - does not endorse symptoms now but reports she doesn't know if she is having them - Ambulatory referral to Cardiology   Preterm labor symptoms and general obstetric precautions including but not limited to vaginal bleeding, contractions, leaking of fluid and fetal movement were reviewed in detail with the patient. Please refer to After Visit Summary for other counseling recommendations.   Return in about 2 weeks (around 01/20/2019) for OB visit (MD), virtual.  Future Appointments  Date Time Provider [place]  01/06/2019  9:00 AM [place] MFC-US  01/06/2019  9:00 AM WH-MFC [place] 3 WH-MFCUS MFC-US  01/06/2019 10:00 AM WOC-WOCA LAB WOC-WOCA WOC    Sloan Leiter, MD

## 2019-01-07 ENCOUNTER — Telehealth: Payer: Self-pay

## 2019-01-07 LAB — RPR: RPR Ser Ql: NONREACTIVE

## 2019-01-07 LAB — GLUCOSE TOLERANCE, 2 HOURS W/ 1HR
Glucose, 1 hour: 117 mg/dL (ref 65–179)
Glucose, 2 hour: 97 mg/dL (ref 65–152)
Glucose, Fasting: 74 mg/dL (ref 65–91)

## 2019-01-07 LAB — CBC
Hematocrit: 27.4 % — ABNORMAL LOW (ref 34.0–46.6)
Hemoglobin: 9.2 g/dL — ABNORMAL LOW (ref 11.1–15.9)
MCH: 29 pg (ref 26.6–33.0)
MCHC: 33.6 g/dL (ref 31.5–35.7)
MCV: 86 fL (ref 79–97)
Platelets: 210 10*3/uL (ref 150–450)
RBC: 3.17 x10E6/uL — ABNORMAL LOW (ref 3.77–5.28)
RDW: 14 % (ref 11.7–15.4)
WBC: 10.2 10*3/uL (ref 3.4–10.8)

## 2019-01-07 LAB — HIV ANTIBODY (ROUTINE TESTING W REFLEX): HIV Screen 4th Generation wRfx: NONREACTIVE

## 2019-01-07 NOTE — Telephone Encounter (Signed)
Called pt to verify if she knew about her appt scheduled with [facility] on 01/11/19 @ 0830.  Pt stated that she is aware of the appt.  Pt did not have any further questions or concerns.

## 2019-01-08 ENCOUNTER — Telehealth: Payer: Self-pay

## 2019-01-08 NOTE — Progress Notes (Signed)
Virtual Visit via Video Note   This visit type was conducted due to national recommendations for restrictions regarding the COVID-19 Pandemic (e.g. social distancing) in an effort to limit this patient's exposure and mitigate transmission in our community.  Due to her co-morbid illnesses, this patient is at least at moderate risk for complications without adequate follow up.  This format is felt to be most appropriate for this patient at this time.  All issues noted in this document were discussed and addressed.  A limited physical exam was performed with this format.  Please refer to the patient's chart for her consent to telehealth for [facility].   Date:  01/08/2019   ID:  Latoya Palmer, DOB 10-15-1985, MRN [id number]  Patient Location: Home Provider Location: Home  PCP:  Patient, No Pcp Per  Cardiologist:  No primary care provider on file. Irish Lack - new Electrophysiologist:  None   Evaluation Performed:  Consultation - Latoya Palmer was referred by Dr. Verita Schneiders for the evaluation of heart murmur.  Chief Complaint:  Heart murmur while pregnant with twins  History of Present Illness:    Latoya Palmer is a 33 y.o. female with prior high risk pregnancy in 2008.  She now is pregnant with twins and considered high risk again.  She had a murmur and some premature beats at that time. She had bradycardia during contractions at that time.   THey would like her to have a cardiac eval again at this time. She is at almost 29 weeks.  On iron for anemia.   Denies : Chest pain. Dizziness. Leg edema. Nitroglycerin use. Orthopnea. Palpitations. Paroxysmal nocturnal dyspnea. Syncope.   2008 echo: Overall left ventricular systolic function was normal. Left     ventricular ejection fraction was estimated , range being 55     % to 60 %. There were no left ventricular regional wall     motion abnormalities.   Normal valvular function at that time.   The patient  does not have symptoms concerning for COVID-19 infection (fever, chills, cough, or new shortness of breath).    Past Medical History:  Diagnosis Date  . Abnormal Pap smear   . Anemia   . Bacterial vaginosis   . Headache(784.0)    hx - last one 09/2016 -otc prn  . Heart murmur    hx - no problems  . HGSIL (high grade squamous intraepithelial lesion) on Pap smear of cervix 07/19/2016   [ ]  Colposcopy and ECC  . Miscarriage 09/17/2013   07/2013 - miscarriage - reeval BHCG to ensure resolved.   . Ovarian cyst   . Preterm labor   . SVD (spontaneous vaginal delivery) 2008   x 1  . Vaginal Pap smear, abnormal    Past Surgical History:  Procedure Laterality Date  . CERVICAL CONIZATION W/BX N/A 11/05/2016   Procedure: CONIZATION CERVIX WITH BIOPSY - COLD KNIFE;  Surgeon: Emily Filbert, MD;  Location: [facility];  Service: Gynecology;  Laterality: N/A;  . COLPOSCOPY    . MYRINGOTOMY    . THERAPEUTIC ABORTION     two     No outpatient medications have been marked as taking for the 01/11/19 encounter (Appointment) with Jettie Booze, MD.     Allergies:   Latex   Social History   Tobacco Use  . Smoking status: Former Smoker    Packs/day: 0.50    Years: 11.00    Pack years: 5.50    Types: Cigarettes  Quit date: 07/24/2018    Years since quitting: 0.4  . Smokeless tobacco: Never Used  Substance Use Topics  . Alcohol use: No  . Drug use: No     Family Hx: The patient's family history includes Heart attack (age of onset: 2450) in her maternal grandmother. There is no history of Cancer, Diabetes, Kidney disease, or Hypertension.  ROS:   Please see the history of present illness.    Fatigue during first trimester, nausea, - improved now All other systems reviewed and are negative.   Prior CV studies:   The following studies were reviewed today:  Normal echo  Labs/Other Tests and Data Reviewed:    EKG:  An ECG dated 3/19 was personally reviewed today and demonstrated:   Normal ECG  Recent Labs: 10/02/2018: ALT 16; BUN 8; Creatinine, Ser 0.57; Potassium 4.1; Sodium 138 01/06/2019: Hemoglobin 9.2; Platelets 210   Recent Lipid Panel No results found for: CHOL, TRIG, HDL, CHOLHDL, LDLCALC, LDLDIRECT  Wt Readings from Last 3 Encounters:  01/06/19 171 lb (77.6 kg)  12/24/18 166 lb 6.4 oz (75.5 kg)  12/09/18 167 lb 1.6 oz (75.8 kg)     Objective:    Vital Signs:  LMP 06/10/2018    VITAL SIGNS:  reviewed GEN:  no acute distress RESPIRATORY:  normal respiratory effort, symmetric expansion PSYCH:  normal affect exam limited by video format  ASSESSMENT & PLAN:    1. Heart murmur: Noted in the past along with bradycardia.  This occurred in 2008 when she had a high risk pregnancy at that time.  We will plan for echocardiogram.  No palpitations.  Will not plan for any type of outpatient monitoring.  Premature beats were noted while she was in labor.  Currently no signs or symptoms of heart failure, except for some shortness of breath when she lies flat.  It is difficult to know whether this is just coming from pregnancy or not.  I encouraged her to stay well-hydrated.  She continues to take iron to avoid anemia.  COVID-19 Education: The signs and symptoms of COVID-19 were discussed with the patient and how to seek care for testing (follow up with PCP or arrange E-visit).  The importance of social distancing was discussed today.  Time:   Today, I have spent 25 minutes with the patient with telehealth technology discussing the above problems.     Medication Adjustments/Labs and Tests Ordered: Current medicines are reviewed at length with the patient today.  Concerns regarding medicines are outlined above.   Tests Ordered: No orders of the defined types were placed in this encounter.   Medication Changes: No orders of the defined types were placed in this encounter.   Follow Up:  Virtual Visit or In Person prn based on echo results  Signed, Lance MussJayadeep  Chozen Latulippe, MD  01/08/2019 5:37 PM    [facility]

## 2019-01-08 NOTE — Telephone Encounter (Signed)
Virtual Visit Pre-Appointment Phone Call  "(Name), I am calling you today to discuss your upcoming appointment. We are currently trying to limit exposure to the virus that causes COVID-19 by seeing patients at home rather than in the office."  1. "What is the BEST phone number to call the day of the visit?" - include this in appointment notes  2. "Do you have or have access to (through a family member/friend) a smartphone with video capability that we can use for your visit?" a. If yes - list this number in appt notes as "cell" (if different from BEST phone #) and list the appointment type as a VIDEO visit in appointment notes b. If no - list the appointment type as a PHONE visit in appointment notes  3. Confirm consent - "In the setting of the current Covid19 crisis, you are scheduled for a (phone or video) visit with your provider on (date) at (time).  Just as we do with many in-office visits, in order for you to participate in this visit, we must obtain consent.  If you'd like, I can send this to your mychart (if signed up) or email for you to review.  Otherwise, I can obtain your verbal consent now.  All virtual visits are billed to your insurance company just like a normal visit would be.  By agreeing to a virtual visit, we'd like you to understand that the technology does not allow for your provider to perform an examination, and thus may limit your provider's ability to fully assess your condition. If your provider identifies any concerns that need to be evaluated in person, we will make arrangements to do so.  Finally, though the technology is pretty good, we cannot assure that it will always work on either your or our end, and in the setting of a video visit, we may have to convert it to a phone-only visit.  In either situation, we cannot ensure that we have a secure connection.  Are you willing to proceed?" STAFF: Did the patient verbally acknowledge consent to telehealth visit? Document  YES/NO here: yes  4. Advise patient to be prepared - "Two hours prior to your appointment, go ahead and check your blood pressure, pulse, oxygen saturation, and your weight (if you have the equipment to check those) and write them all down. When your visit starts, your provider will ask you for this information. If you have an Apple Watch or Kardia device, please plan to have heart rate information ready on the day of your appointment. Please have a pen and paper handy nearby the day of the visit as well."  5. Give patient instructions for MyChart download to smartphone OR Doximity/Doxy.me as below if video visit (depending on what platform provider is using)  6. Inform patient they will receive a phone call 15 minutes prior to their appointment time (may be from unknown caller ID) so they should be prepared to answer    TELEPHONE CALL NOTE  Latoya Palmer has been deemed a candidate for a follow-up tele-health visit to limit community exposure during the Covid-19 pandemic. I spoke with the patient via phone to ensure availability of phone/video source, confirm preferred email & phone number, and discuss instructions and expectations.  I reminded Latoya Palmer to be prepared with any vital sign and/or heart rhythm information that could potentially be obtained via home monitoring, at the time of her visit. I reminded Latoya Palmer to expect a phone call prior to  her visit.  Clide DalesDanielle M Javoris Star, CMA 01/08/2019 10:00 AM   INSTRUCTIONS FOR DOWNLOADING THE MYCHART APP TO SMARTPHONE  - The patient must first make sure to have activated MyChart and know their login information - If Apple, go to [employer] Store and type in MyChart in the search bar and download the app. If Android, ask patient to go to [employer] Play Store and type in [place] in the search bar and download the app. The app is free but as with any other app downloads, their phone may require them to verify saved payment information or  Apple/Android password.  - The patient will need to then log into the app with their MyChart username and password, and select [facility] as their healthcare provider to link the account. When it is time for your visit, go to the MyChart app, find appointments, and click Begin Video Visit. Be sure to Select Allow for your device to access the Microphone and Camera for your visit. You will then be connected, and your provider will be with you shortly.  **If they have any issues connecting, or need assistance please contact MyChart service desk [phone])**  **If using a computer, in order to ensure the best quality for their visit they will need to use either of the following Internet Browsers: [employer] Edge, or Google Chrome**  IF USING DOXIMITY or DOXY.ME - The patient will receive a link just prior to their visit by text.     FULL LENGTH CONSENT FOR TELE-HEALTH VISIT   I hereby voluntarily request, consent and authorize [facility] and its employed or contracted physicians, physician assistants, nurse practitioners or other licensed health care professionals (the Practitioner), to provide me with telemedicine health care services (the "Services") as deemed necessary by the treating Practitioner. I acknowledge and consent to receive the Services by the Practitioner via telemedicine. I understand that the telemedicine visit will involve communicating with the Practitioner through live audiovisual communication technology and the disclosure of certain medical information by electronic transmission. I acknowledge that I have been given the opportunity to request an in-person assessment or other available alternative prior to the telemedicine visit and am voluntarily participating in the telemedicine visit.  I understand that I have the right to withhold or withdraw my consent to the use of telemedicine in the course of my care at any time, without affecting my right to future care  or treatment, and that the Practitioner or I may terminate the telemedicine visit at any time. I understand that I have the right to inspect all information obtained and/or recorded in the course of the telemedicine visit and may receive copies of available information for a reasonable fee.  I understand that some of the potential risks of receiving the Services via telemedicine include:  Marland Kitchen. Delay or interruption in medical evaluation due to technological equipment failure or disruption; . Information transmitted may not be sufficient (e.g. poor resolution of images) to allow for appropriate medical decision making by the Practitioner; and/or  . In rare instances, security protocols could fail, causing a breach of personal health information.  Furthermore, I acknowledge that it is my responsibility to provide information about my medical history, conditions and care that is complete and accurate to the best of my ability. I acknowledge that Practitioner's advice, recommendations, and/or decision may be based on factors not within their control, such as incomplete or inaccurate data provided by me or distortions of diagnostic images or specimens that may result from electronic transmissions.  I understand that the practice of medicine is not an exact science and that Practitioner makes no warranties or guarantees regarding treatment outcomes. I acknowledge that I will receive a copy of this consent concurrently upon execution via email to the email address I last provided but may also request a printed copy by calling the office of [facility].    I understand that my insurance will be billed for this visit.   I have read or had this consent read to me. . I understand the contents of this consent, which adequately explains the benefits and risks of the Services being provided via telemedicine.  . I have been provided ample opportunity to ask questions regarding this consent and the Services and have had  my questions answered to my satisfaction. . I give my informed consent for the services to be provided through the use of telemedicine in my medical care  By participating in this telemedicine visit I agree to the above.

## 2019-01-11 ENCOUNTER — Encounter: Payer: Self-pay | Admitting: Interventional Cardiology

## 2019-01-11 ENCOUNTER — Other Ambulatory Visit: Payer: Self-pay

## 2019-01-11 ENCOUNTER — Telehealth (INDEPENDENT_AMBULATORY_CARE_PROVIDER_SITE_OTHER): Payer: Medicaid Other | Admitting: Interventional Cardiology

## 2019-01-11 VITALS — BP 135/78 | HR 91 | Ht 60.0 in | Wt 171.8 lb

## 2019-01-11 DIAGNOSIS — O99013 Anemia complicating pregnancy, third trimester: Secondary | ICD-10-CM

## 2019-01-11 DIAGNOSIS — R0601 Orthopnea: Secondary | ICD-10-CM

## 2019-01-11 DIAGNOSIS — R011 Cardiac murmur, unspecified: Secondary | ICD-10-CM

## 2019-01-11 NOTE — Patient Instructions (Signed)
Medication Instructions:  Your physician recommends that you continue on your current medications as directed. Please refer to the Current Medication list given to you today.  If you need a refill on your cardiac medications before your next appointment, please call your pharmacy.   Lab work: None Ordered  If you have labs (blood work) drawn today and your tests are completely normal, you will receive your results only by: . MyChart Message (if you have MyChart) OR . A paper copy in the mail If you have any lab test that is abnormal or we need to change your treatment, we will call you to review the results.  Testing/Procedures: Your physician has requested that you have an echocardiogram. Echocardiography is a painless test that uses sound waves to create images of your heart. It provides your doctor with information about the size and shape of your heart and how well your heart's chambers and valves are working. This procedure takes approximately one hour. There are no restrictions for this procedure.  Follow-Up: Based on test results  Any Other Special Instructions Will Be Listed Below (If Applicable).    

## 2019-01-13 ENCOUNTER — Telehealth (HOSPITAL_COMMUNITY): Payer: Self-pay | Admitting: *Deleted

## 2019-01-13 NOTE — Telephone Encounter (Signed)
COVID-19 Pre-Screening Questions:  . Do you currently have a fever?No (yes = cancel and refer to pcp for e-visit) . Have you recently travelled on a cruise, internationally, or to [place], [place], [place], [place], [place], or [place], [place] ([place]) ?NO (yes = cancel, stay home, monitor symptoms, and contact pcp or initiate e-visit if symptoms develop) . Have you been in contact with someone that is currently pending confirmation of Covid19 testing or has been confirmed to have the Covid19 virus?NO (yes = cancel, stay home, away from tested individual, monitor symptoms, and contact pcp or initiate e-visit if symptoms develop) . Are you currently experiencing fatigue or coughNO (yes = pt should be prepared to have a mask placed at the time of their visit). . Reiterated no additional visitors. . Arrive no earlier than 15 minutes before appointment time. . Please bring own mask.    

## 2019-01-14 ENCOUNTER — Ambulatory Visit (HOSPITAL_COMMUNITY): Payer: Medicaid Other | Attending: Internal Medicine

## 2019-01-14 ENCOUNTER — Other Ambulatory Visit: Payer: Self-pay

## 2019-01-14 DIAGNOSIS — R011 Cardiac murmur, unspecified: Secondary | ICD-10-CM

## 2019-01-18 ENCOUNTER — Telehealth: Payer: Self-pay | Admitting: Interventional Cardiology

## 2019-01-18 NOTE — Telephone Encounter (Signed)
The patient has been notified of the result and verbalized understanding.  All questions (if any) were answered. Cleon Gustin, RN 01/18/2019 4:33 PM

## 2019-01-18 NOTE — Telephone Encounter (Signed)
New message   Patient is returning call for echo results. Please call. 

## 2019-01-18 NOTE — Telephone Encounter (Signed)
-----   Message from Jettie Booze, MD sent at 01/15/2019  2:26 PM EDT ----- Normal LV function.  Normal valvular function.  Dilated left atrium which may be related to pregnancy.

## 2019-01-20 ENCOUNTER — Encounter: Payer: Self-pay | Admitting: Obstetrics and Gynecology

## 2019-01-20 ENCOUNTER — Telehealth (INDEPENDENT_AMBULATORY_CARE_PROVIDER_SITE_OTHER): Payer: Medicaid Other | Admitting: Obstetrics and Gynecology

## 2019-01-20 ENCOUNTER — Other Ambulatory Visit: Payer: Self-pay

## 2019-01-20 DIAGNOSIS — O99013 Anemia complicating pregnancy, third trimester: Secondary | ICD-10-CM

## 2019-01-20 DIAGNOSIS — O30043 Twin pregnancy, dichorionic/diamniotic, third trimester: Secondary | ICD-10-CM

## 2019-01-20 DIAGNOSIS — O3441 Maternal care for other abnormalities of cervix, first trimester: Secondary | ICD-10-CM

## 2019-01-20 DIAGNOSIS — O0993 Supervision of high risk pregnancy, unspecified, third trimester: Secondary | ICD-10-CM

## 2019-01-20 DIAGNOSIS — O3443 Maternal care for other abnormalities of cervix, third trimester: Secondary | ICD-10-CM

## 2019-01-20 DIAGNOSIS — Z3A30 30 weeks gestation of pregnancy: Secondary | ICD-10-CM

## 2019-01-20 DIAGNOSIS — O0990 Supervision of high risk pregnancy, unspecified, unspecified trimester: Secondary | ICD-10-CM

## 2019-01-20 DIAGNOSIS — O30049 Twin pregnancy, dichorionic/diamniotic, unspecified trimester: Secondary | ICD-10-CM

## 2019-01-20 NOTE — Progress Notes (Signed)
   [facility] VIRTUAL VIDEO VISIT ENCOUNTER NOTE  Provider location: Center for [employer] at [facility]   I connected with [name] on 01/20/19 at 10:35 AM EDT by MyChart Video Encounter at home and verified that I am speaking with the correct person using two identifiers.   I discussed the limitations, risks, security and privacy concerns of performing an evaluation and management service by telephone and the availability of in person appointments. I also discussed with the patient that there may be a patient responsible charge related to this service. The patient expressed understanding and agreed to proceed. Subjective:  Latoya Palmer is a 33 y.o. [id number] at [redacted]w[redacted]d being seen today for ongoing prenatal care.  She is currently monitored for the following issues for this high-risk pregnancy and has History of cervical cone biopsy affecting care of mother in first trimester, antepartum; Dichorionic diamniotic twin gestation; Supervision of high risk pregnancy, antepartum; and Anemia in pregnancy on their problem list.  Patient reports vaginal pressure.  Contractions: Not present. Vag. Bleeding: None.  Movement: Present. Denies any leaking of fluid.   The following portions of the patient's history were reviewed and updated as appropriate: allergies, current medications, past family history, past medical history, past social history, past surgical history and problem list.   Objective:   Vitals:   01/20/19 1040  BP: 119/75  Pulse: 85  Weight: 172 lb (78 kg)    Fetal Status:     Movement: Present     General:  Alert, oriented and cooperative. Patient is in no acute distress.  Respiratory: Normal respiratory effort, no problems with respiration noted  Mental Status: Normal mood and affect. Normal behavior. Normal judgment and thought content.  Rest of physical exam deferred due to type of encounter  Imaging:   Assessment and Plan:  Pregnancy:  [id number] at [redacted]w[redacted]d  1. Anemia during pregnancy in third trimester  2. Supervision of high risk pregnancy, antepartum Doing well  3. History of cervical cone biopsy affecting care of mother in first trimester, antepartum  4. Dichorionic diamniotic twin pregnancy, antepartum discordancy 4% Normal growth F/u US on 7/8  5. Heart murmur - s/p cardio eval, had normal EF with slightly dilated left atrium  Preterm labor symptoms and general obstetric precautions including but not limited to vaginal bleeding, contractions, leaking of fluid and fetal movement were reviewed in detail with the patient. I discussed the assessment and treatment plan with the patient. The patient was provided an opportunity to ask questions and all were answered. The patient agreed with the plan and demonstrated an understanding of the instructions. The patient was advised to call back or seek an in-person office evaluation/go to [facility] at [facility] for any urgent or concerning symptoms. Please refer to After Visit Summary for other counseling recommendations.   I provided 15 minutes of face-to-face time during this encounter.  Return in about 2 weeks (around 02/03/2019) for OB visit (MD), virtual.  Future Appointments  Date Time Provider [place]  02/03/2019  8:45 AM [place] [facility]  02/03/2019  8:45 AM [place], [place] for [place], [place]

## 2019-01-20 NOTE — Progress Notes (Signed)
I connected with  Latoya Palmer on 01/20/19 at 10:35 AM EDT by Mychart Video and verified that I am speaking with the correct person using two identifiers.   I discussed the limitations, risks, security and privacy concerns of performing an evaluation and management service by telephone and the availability of in person appointments. I also discussed with the patient that there may be a patient responsible charge related to this service. The patient expressed understanding and agreed to proceed.  [place], CMA 01/20/2019  10:39 AM

## 2019-02-03 ENCOUNTER — Ambulatory Visit (HOSPITAL_COMMUNITY)
Admission: RE | Admit: 2019-02-03 | Discharge: 2019-02-03 | Disposition: A | Discharge status: Home / Self Care | Payer: Medicaid Other | Source: Ambulatory Visit | Attending: Maternal & Fetal Medicine | Admitting: Maternal & Fetal Medicine

## 2019-02-03 ENCOUNTER — Ambulatory Visit (HOSPITAL_COMMUNITY): Payer: Medicaid Other

## 2019-02-03 ENCOUNTER — Encounter (HOSPITAL_COMMUNITY): Payer: Self-pay

## 2019-02-06 ENCOUNTER — Ambulatory Visit (HOSPITAL_COMMUNITY)
Admission: RE | Admit: 2019-02-06 | Discharge: 2019-02-06 | Disposition: A | Discharge status: Home / Self Care | Payer: Medicaid Other | Source: Ambulatory Visit | Attending: Maternal & Fetal Medicine | Admitting: Maternal & Fetal Medicine

## 2019-02-06 ENCOUNTER — Other Ambulatory Visit (HOSPITAL_COMMUNITY): Payer: Self-pay | Admitting: *Deleted

## 2019-02-06 ENCOUNTER — Other Ambulatory Visit (HOSPITAL_COMMUNITY): Payer: Self-pay | Admitting: Maternal & Fetal Medicine

## 2019-02-06 ENCOUNTER — Encounter (HOSPITAL_COMMUNITY): Payer: Self-pay | Admitting: *Deleted

## 2019-02-06 ENCOUNTER — Ambulatory Visit (HOSPITAL_COMMUNITY): Payer: Medicaid Other | Admitting: *Deleted

## 2019-02-06 ENCOUNTER — Other Ambulatory Visit: Payer: Self-pay

## 2019-02-06 DIAGNOSIS — O30049 Twin pregnancy, dichorionic/diamniotic, unspecified trimester: Secondary | ICD-10-CM

## 2019-02-06 DIAGNOSIS — O3441 Maternal care for other abnormalities of cervix, first trimester: Secondary | ICD-10-CM

## 2019-02-06 DIAGNOSIS — O99013 Anemia complicating pregnancy, third trimester: Secondary | ICD-10-CM

## 2019-02-06 DIAGNOSIS — O30043 Twin pregnancy, dichorionic/diamniotic, third trimester: Secondary | ICD-10-CM | POA: Diagnosis not present

## 2019-02-06 DIAGNOSIS — O0990 Supervision of high risk pregnancy, unspecified, unspecified trimester: Secondary | ICD-10-CM | POA: Insufficient documentation

## 2019-02-06 DIAGNOSIS — O3443 Maternal care for other abnormalities of cervix, third trimester: Secondary | ICD-10-CM | POA: Diagnosis not present

## 2019-02-06 DIAGNOSIS — Z3A32 32 weeks gestation of pregnancy: Secondary | ICD-10-CM

## 2019-02-06 DIAGNOSIS — Z362 Encounter for other antenatal screening follow-up: Secondary | ICD-10-CM

## 2019-02-08 ENCOUNTER — Encounter: Payer: Self-pay | Admitting: *Deleted

## 2019-02-09 ENCOUNTER — Encounter: Payer: Self-pay | Admitting: *Deleted

## 2019-02-09 NOTE — Progress Notes (Signed)
Received message that Latoya Palmer is requesting a letter from 08/04/2018 for [employer] to state high risk pregnancy restrictions and to avoid Covid 19 exposure and if she could not avoid exposure due to high risk, she cannot work there.  CM called the office of [place], talked to [name]; [place] is not in the office today but they will send her a message to call [name] to explain to her that they cannot give her a letter stating that if she cannot avoid Covid exposure she cannot work for that company.  [name] also stated that they gave her a letter with restrictions on 11/11/2018.  CM called [name] with an update and informed her that [place] will be in contact with her about the letter;  [facility] Supervisor [phone]

## 2019-02-10 ENCOUNTER — Other Ambulatory Visit: Payer: Self-pay

## 2019-02-10 ENCOUNTER — Other Ambulatory Visit: Payer: Self-pay | Admitting: *Deleted

## 2019-02-10 ENCOUNTER — Telehealth (INDEPENDENT_AMBULATORY_CARE_PROVIDER_SITE_OTHER): Payer: Medicaid Other | Admitting: Obstetrics and Gynecology

## 2019-02-10 ENCOUNTER — Encounter: Payer: Self-pay | Admitting: Obstetrics and Gynecology

## 2019-02-10 VITALS — BP 132/81 | HR 102 | Wt 173.0 lb

## 2019-02-10 DIAGNOSIS — O3441 Maternal care for other abnormalities of cervix, first trimester: Secondary | ICD-10-CM

## 2019-02-10 DIAGNOSIS — O0993 Supervision of high risk pregnancy, unspecified, third trimester: Secondary | ICD-10-CM

## 2019-02-10 DIAGNOSIS — O30043 Twin pregnancy, dichorionic/diamniotic, third trimester: Secondary | ICD-10-CM

## 2019-02-10 DIAGNOSIS — O0990 Supervision of high risk pregnancy, unspecified, unspecified trimester: Secondary | ICD-10-CM

## 2019-02-10 DIAGNOSIS — O99013 Anemia complicating pregnancy, third trimester: Secondary | ICD-10-CM

## 2019-02-10 DIAGNOSIS — O30049 Twin pregnancy, dichorionic/diamniotic, unspecified trimester: Secondary | ICD-10-CM

## 2019-02-10 DIAGNOSIS — O3443 Maternal care for other abnormalities of cervix, third trimester: Secondary | ICD-10-CM

## 2019-02-10 NOTE — Progress Notes (Signed)
   [place] VIRTUAL VIDEO VISIT ENCOUNTER NOTE  Provider location: Center for [employer] at [facility]   I connected with [name] on 02/10/19 at  9:55 AM EDT by MyChart Video Encounter at home and verified that I am speaking with the correct person using two identifiers.   I discussed the limitations, risks, security and privacy concerns of performing an evaluation and management service virtually and the availability of in person appointments. I also discussed with the patient that there may be a patient responsible charge related to this service. The patient expressed understanding and agreed to proceed. Subjective:  NONIE LOCHNER is a 33 y.o. [phone] at [redacted]w[redacted]d being seen today for ongoing prenatal care.  She is currently monitored for the following issues for this high-risk pregnancy and has History of cervical cone biopsy affecting care of mother in first trimester, antepartum; Dichorionic diamniotic twin gestation; Supervision of high risk pregnancy, antepartum; and Anemia in pregnancy on their problem list.  Patient reports pressure and occasional contractions.  Contractions: Irregular. Vag. Bleeding: None.  Movement: Present. Denies any leaking of fluid.   The following portions of the patient's history were reviewed and updated as appropriate: allergies, current medications, past family history, past medical history, past social history, past surgical history and problem list.   Objective:   Vitals:   02/10/19 0952  BP: 132/81  Pulse: (!) 102  Weight: 173 lb (78.5 kg)    Fetal Status:     Movement: Present     General:  Alert, oriented and cooperative. Patient is in no acute distress.  Respiratory: Normal respiratory effort, no problems with respiration noted  Mental Status: Normal mood and affect. Normal behavior. Normal judgment and thought content.  Rest of physical exam deferred due to type of encounter   Assessment and Plan:   Pregnancy: [id number] at [redacted]w[redacted]d  1. Dichorionic diamniotic twin pregnancy in third trimester - Reviewed risks of vaginal delivery vs c-section, breech extraction, she would like to try to for vaginal delivery - reviewed plan for delivery at 38 weeks  2. Supervision of high risk pregnancy, antepartum  3. History of cervical cone biopsy affecting care of mother in first trimester, antepartum  4. Anemia during pregnancy in third trimester   Preterm labor symptoms and general obstetric precautions including but not limited to vaginal bleeding, contractions, leaking of fluid and fetal movement were reviewed in detail with the patient. I discussed the assessment and treatment plan with the patient. The patient was provided an opportunity to ask questions and all were answered. The patient agreed with the plan and demonstrated an understanding of the instructions. The patient was advised to call back or seek an in-person office evaluation/go to [facility] at [facility] for any urgent or concerning symptoms. Please refer to After Visit Summary for other counseling recommendations.   I provided 18 minutes of face-to-face time during this encounter.  No follow-ups on file.  Future Appointments  Date Time Provider [place]  02/19/2019  9:55 AM Anyanwu, Sallyanne Havers, MD [facility]  02/26/2019  8:35 AM Harolyn Rutherford, Sallyanne Havers, MD [facility]OC  03/03/2019  3:15 PM Harolyn Rutherford, Sallyanne Havers, MD [facility]  03/04/2019  9:30 AM [place] NURSE [place] [facility]  03/04/2019  9:30 AM [facility] [place] 1 [facility]    Sloan Leiter, [place] for [facility], [place]

## 2019-02-10 NOTE — Progress Notes (Signed)
I connected with  Latoya Palmer on 02/10/19 at  9:55 AM EDT by telephone and verified that I am speaking with the correct person using two identifiers.   I discussed the limitations, risks, security and privacy concerns of performing an evaluation and management service by telephone and the availability of in person appointments. I also discussed with the patient that there may be a patient responsible charge related to this service. The patient expressed understanding and agreed to proceed.  [place], [place] 02/10/2019  9:51 AM

## 2019-02-19 ENCOUNTER — Encounter: Payer: Self-pay | Admitting: Obstetrics & Gynecology

## 2019-02-19 ENCOUNTER — Other Ambulatory Visit: Payer: Self-pay

## 2019-02-19 ENCOUNTER — Telehealth (INDEPENDENT_AMBULATORY_CARE_PROVIDER_SITE_OTHER): Payer: Medicaid Other | Admitting: Obstetrics & Gynecology

## 2019-02-19 VITALS — BP 114/65 | HR 91 | Wt 177.0 lb

## 2019-02-19 DIAGNOSIS — O0993 Supervision of high risk pregnancy, unspecified, third trimester: Secondary | ICD-10-CM

## 2019-02-19 DIAGNOSIS — Z3A34 34 weeks gestation of pregnancy: Secondary | ICD-10-CM

## 2019-02-19 DIAGNOSIS — O30043 Twin pregnancy, dichorionic/diamniotic, third trimester: Secondary | ICD-10-CM

## 2019-02-19 DIAGNOSIS — O0990 Supervision of high risk pregnancy, unspecified, unspecified trimester: Secondary | ICD-10-CM

## 2019-02-19 NOTE — Progress Notes (Signed)
TELEHEALTH OBSTETRICS PRENATAL VIRTUAL VIDEO VISIT ENCOUNTER NOTE  Provider location: [facility] [employer] [facility]   I connected with Latoya Palmer on 02/19/19 at  9:55 AM EDT by MyChart Video Encounter at home and verified that I am speaking with the correct person using two identifiers.   I discussed the limitations, risks, security and privacy concerns of performing an evaluation and management service virtually and the availability of in person appointments. I also discussed with the patient that there may be a patient responsible charge related to this service. The patient expressed understanding and agreed to proceed. Subjective:  Latoya Palmer is a 33 y.o. [phone] at [redacted]w[redacted]d being seen today for ongoing prenatal care.  She is currently monitored for the following issues for this high-risk pregnancy and has History of cervical cone biopsy affecting care of mother in first trimester, antepartum; Dichorionic diamniotic twin gestation; Supervision of high risk pregnancy, antepartum; and Anemia in pregnancy on their problem list.  Patient reports no significant complaints.  Contractions: Irregular. Vag. Bleeding: None.  Movement: Present. Denies any leaking of fluid.   The following portions of the patient's history were reviewed and updated as appropriate: allergies, current medications, past family history, past medical history, past social history, past surgical history and problem list.   Objective:   Vitals:   02/19/19 1000  BP: 114/65  Pulse: 91  Weight: 177 lb (80.3 kg)    Fetal Status:     Movement: Present     General:  Alert, oriented and cooperative. Patient is in no acute distress.  Respiratory: Normal respiratory effort, no problems with respiration noted  Mental Status: Normal mood and affect. Normal behavior. Normal judgment and thought content.  Rest of physical exam deferred due to type of encounter  Imaging: [place] Mfm Ob Follow Up  Result  Date: 02/06/2019 ----------------------------------------------------------------------  OBSTETRICS REPORT                       (Signed Final 02/06/2019 12:05 pm) ---------------------------------------------------------------------- Patient Info  ID #:       [id number]                          D.O.B.:  May 02, 1986 (32 yrs)  Name:       Latoya Palmer               Visit Date: 02/06/2019 10:15 am ---------------------------------------------------------------------- Performed By  Performed By:     Eden Lathe BS      Ref. Address:     [address]                    RDMS RVT                                                             Rd  Attending:        Noralee Space MD        [facility]  Referred By:      Conan BowensKELLY M DAVIS                    MD ---------------------------------------------------------------------- Orders   #  Description                          Code         Ordered By   1  US MFM OB FOLLOW UP                  [id number]     Lin LandsmanORENTHIAN                                                        BOOKER   2  US MFM OB FOLLOW UP ADDL             [id number]     Jettie PaganORENTHIAN      GEST                                              BOOKER  ----------------------------------------------------------------------   #  Order #                    Accession #                 Episode #   1  [id number]                  [id number]   2  [id number]                  [id number]  ---------------------------------------------------------------------- Indications   Encounter for other antenatal screening        Z36.2   follow-up   Twin pregnancy, di/di, third trimester (low    O30.043   risk NIPS)   Previous cervical surgery (Cervical            O34.40   Conization with BX 2018)   [redacted] weeks gestation of pregnancy                Z3A.32   ---------------------------------------------------------------------- Fetal Evaluation (Fetus A)  Num Of Fetuses:         2  Fetal Heart Rate(bpm):  144  Cardiac Activity:       Observed  Fetal Lie:              Maternal right side  Presentation:           Cephalic  Placenta:               Anterior  P. Cord Insertion:      Previously Visualized  Amniotic Fluid  AFI FV:      Within normal limits                              Largest Pocket(cm)  4.8 ---------------------------------------------------------------------- Biometry (Fetus A)  BPD:      82.4  mm     G. Age:  33w 1d         60  %    CI:        74.12   %    70 - 86                                                          FL/HC:      19.8   %    19.9 - 21.5  HC:      303.9  mm     G. Age:  33w 6d         45  %    HC/AC:      1.05        0.96 - 1.11  AC:      290.1  mm     G. Age:  33w 0d         63  %    FL/BPD:     73.1   %    71 - 87  FL:       60.2  mm     G. Age:  31w 2d         11  %    FL/AC:      20.8   %    20 - 24  HUM:      55.4  mm     G. Age:  32w 2d         48  %  Est. FW:    2011  gm      4 lb 7 oz     41  %     FW Discordancy         2  % ---------------------------------------------------------------------- OB History  Gravidity:    8         Term:   1         SAB:   3  TOP:          3        Living:  1 ---------------------------------------------------------------------- Gestational Age (Fetus A)  LMP:           34w 3d        Date:  06/10/18                 EDD:   03/17/19  U/S Today:     32w 6d                                        EDD:   03/28/19  Best:          32w 4d     Det. ByMarcella Dubs:  Early Ultrasound         EDD:   03/30/19                                      (08/04/18) ---------------------------------------------------------------------- Anatomy (Fetus A)  Cranium:               Appears normal  LVOT:                   Previously seen  Cavum:                 Previously seen        Aortic Arch:             Previously seen  Ventricles:            Previously seen        Ductal Arch:            Previously seen  Choroid Plexus:        Previously seen        Diaphragm:              Previously seen  Cerebellum:            Previously seen        Stomach:                Appears normal, left                                                                        sided  Posterior Fossa:       Previously seen        Abdomen:                Previously seen  Nuchal Fold:           Previously seen        Abdominal Wall:         Previously seen  Face:                  Orbits and profile     Cord Vessels:           Appears normal (3                         previously seen                                vessel cord)  Lips:                  Appears normal         Kidneys:                Appear normal  Palate:                Previously seen        Bladder:                Appears normal  Thoracic:              Appears normal         Spine:                  Previously seen  Heart:                 Appears normal         Upper Extremities:      Previously seen                         (  4CH, axis, and                         situs)  RVOT:                  Previously seen        Lower Extremities:      Previously seen  Other:  Heels visualized previously. Nasal bone visualized previously. ---------------------------------------------------------------------- Fetal Evaluation (Fetus B)  Num Of Fetuses:         2  Fetal Heart Rate(bpm):  136  Cardiac Activity:       Observed  Fetal Lie:              Maternal left side  Presentation:           Breech  Placenta:               Posterior  P. Cord Insertion:      Visualized  Amniotic Fluid  AFI FV:      Within normal limits                              Largest Pocket(cm)                              6.4 ---------------------------------------------------------------------- Biometry (Fetus B)  BPD:      82.6  mm     G. Age:  33w 2d         62  %    CI:        74.18   %    70 - 86                                                           FL/HC:      20.9   %    19.9 - 21.5  HC:      304.5  mm     G. Age:  33w 6d         47  %    HC/AC:      1.07        0.96 - 1.11  AC:       285   mm     G. Age:  32w 4d         48  %    FL/BPD:     77.1   %    71 - 87  FL:       63.7  mm     G. Age:  32w 6d         47  %    FL/AC:      22.4   %    20 - 24  HUM:      55.1  mm     G. Age:  32w 0d         45  %  Est. FW:    2061  gm      4 lb 9 oz     48  %     FW Discordancy      0 \ 2 % ---------------------------------------------------------------------- Gestational Age (Fetus B)  LMP:  34w 3d        Date:  06/10/18                 EDD:   03/17/19  U/S Today:     33w 1d                                        EDD:   03/26/19  Best:          32w 4d     Det. ByMarcella Dubs         EDD:   03/30/19                                      (08/04/18) ---------------------------------------------------------------------- Anatomy (Fetus B)  Cranium:               Appears normal         Aortic Arch:            Previously seen  Cavum:                 Appears normal         Ductal Arch:            Previously seen  Ventricles:            Appears normal         Diaphragm:              Previously seen  Choroid Plexus:        Appears normal         Stomach:                Appears normal, left                                                                        sided  Cerebellum:            Appears normal         Abdomen:                Previously seen  Posterior Fossa:       Appears normal         Abdominal Wall:         Previously seen  Nuchal Fold:           Previously seen        Cord Vessels:           Previously seen  Face:                  Orbits and profile     Kidneys:                Appear normal                         previously seen  Lips:                  Previously seen  Bladder:                Appears normal  Thoracic:              Appears normal         Spine:                  Previously seen  Heart:                  Previously seen        Upper Extremities:      Previously seen  RVOT:                  Previously seen        Lower Extremities:      Previously seen  LVOT:                  Previously seen  Other:  Heels and 5th digit visualized previously. ---------------------------------------------------------------------- Cervix Uterus Adnexa  Cervix  Not visualized (advanced GA >24wks)  Uterus  No abnormality visualized.  Left Ovary  Within normal limits.  Right Ovary  Within normal limits.  Cul De Sac  No free fluid seen.  Adnexa  No abnormality visualized. ---------------------------------------------------------------------- Impression  Dichorionic-diamniotic twin pregnancy.  Twin A: Maternal right, cephalic, anterior placenta. Amniotic  fluid is normal and good fetal activity is seen. Fetal growth is  appropriate for gestational age.  Twin B: Maternal left, breech, posterior placenta. Amniotic  fluid is normal and good fetal activity is seen. Fetal growth is  appropriate for gestational age.  Growth discordancy: 2% (normal).  Patient does not have gestational diabetes. ---------------------------------------------------------------------- Recommendations  An appointment was made for her to return in 4 weeks for  fetal growth assessment. ----------------------------------------------------------------------                  Noralee Space, MD Electronically Signed Final Report   02/06/2019 12:05 pm ----------------------------------------------------------------------  [place] Mfm Ob Follow Up Addl Gest  Result Date: 02/06/2019 ----------------------------------------------------------------------  OBSTETRICS REPORT                       (Signed Final 02/06/2019 12:05 pm) ---------------------------------------------------------------------- Patient Info  ID #:       [id number]                          D.O.B.:  12/11/1985 (32 yrs)  Name:       Latoya Palmer               Visit Date: 02/06/2019 10:15 am  ---------------------------------------------------------------------- Performed By  Performed By:     Eden Lathe BS      Ref. Address:     [address]                    [address] RVT                                                             Rd  Attending:        Noralee Space MD        [facility]  Referred By:      Conan Bowens                    MD ---------------------------------------------------------------------- Orders   #  Description                          Code         Ordered By   1  [place] MFM OB FOLLOW UP                  09811.91     Lin Landsman   2  [place] MFM OB FOLLOW UP ADDL             47829.56     Jettie Pagan  ----------------------------------------------------------------------   #  Order #                    Accession #                 Episode #   1  [id number]                  [id number]   2  [id number]                  [id number]                  [id number]  ---------------------------------------------------------------------- Indications   Encounter for other antenatal screening        Z36.2   follow-up   Twin pregnancy, di/di, third trimester (low    O30.043   risk NIPS)   Previous cervical surgery (Cervical            O34.40   Conization with BX 2018)   [redacted] weeks gestation of pregnancy                Z3A.32  ---------------------------------------------------------------------- Fetal Evaluation (Fetus A)  Num Of Fetuses:         2  Fetal Heart Rate(bpm):  144  Cardiac Activity:       Observed  Fetal Lie:              Maternal right side  Presentation:           Cephalic  Placenta:               Anterior  P. Cord Insertion:      Previously Visualized  Amniotic Fluid  AFI FV:      Within normal limits                              Largest Pocket(cm)  4.8 ---------------------------------------------------------------------- Biometry (Fetus A)  BPD:      82.4  mm     G. Age:  33w 1d         60  %    CI:        74.12   %    70 - 86                                                          FL/HC:      19.8   %    19.9 - 21.5  HC:      303.9  mm     G. Age:  33w 6d         45  %    HC/AC:      1.05        0.96 - 1.11  AC:      290.1  mm     G. Age:  33w 0d         63  %    FL/BPD:     73.1   %    71 - 87  FL:       60.2  mm     G. Age:  31w 2d         11  %    FL/AC:      20.8   %    20 - 24  HUM:      55.4  mm     G. Age:  32w 2d         48  %  Est. FW:    2011  gm      4 lb 7 oz     41  %     FW Discordancy         2  % ---------------------------------------------------------------------- OB History  Gravidity:    8         Term:   1         SAB:   3  TOP:          3        Living:  1 ---------------------------------------------------------------------- Gestational Age (Fetus A)  LMP:           34w 3d        Date:  06/10/18                 EDD:   03/17/19  U/S Today:     32w 6d                                        EDD:   03/28/19  Best:          32w 4d     Det. ByMarcella Dubs:  Early Ultrasound         EDD:   03/30/19                                      (08/04/18) ---------------------------------------------------------------------- Anatomy (Fetus A)  Cranium:               Appears normal  LVOT:                   Previously seen  Cavum:                 Previously seen        Aortic Arch:            Previously seen  Ventricles:            Previously seen        Ductal Arch:            Previously seen  Choroid Plexus:        Previously seen        Diaphragm:              Previously seen  Cerebellum:            Previously seen        Stomach:                Appears normal, left                                                                        sided  Posterior Fossa:       Previously seen        Abdomen:                Previously seen   Nuchal Fold:           Previously seen        Abdominal Wall:         Previously seen  Face:                  Orbits and profile     Cord Vessels:           Appears normal (3                         previously seen                                vessel cord)  Lips:                  Appears normal         Kidneys:                Appear normal  Palate:                Previously seen        Bladder:                Appears normal  Thoracic:              Appears normal         Spine:                  Previously seen  Heart:                 Appears normal         Upper Extremities:      Previously seen                         (  4CH, axis, and                         situs)  RVOT:                  Previously seen        Lower Extremities:      Previously seen  Other:  Heels visualized previously. Nasal bone visualized previously. ---------------------------------------------------------------------- Fetal Evaluation (Fetus B)  Num Of Fetuses:         2  Fetal Heart Rate(bpm):  136  Cardiac Activity:       Observed  Fetal Lie:              Maternal left side  Presentation:           Breech  Placenta:               Posterior  P. Cord Insertion:      Visualized  Amniotic Fluid  AFI FV:      Within normal limits                              Largest Pocket(cm)                              6.4 ---------------------------------------------------------------------- Biometry (Fetus B)  BPD:      82.6  mm     G. Age:  33w 2d         62  %    CI:        74.18   %    70 - 86                                                          FL/HC:      20.9   %    19.9 - 21.5  HC:      304.5  mm     G. Age:  33w 6d         47  %    HC/AC:      1.07        0.96 - 1.11  AC:       285   mm     G. Age:  32w 4d         48  %    FL/BPD:     77.1   %    71 - 87  FL:       63.7  mm     G. Age:  32w 6d         47  %    FL/AC:      22.4   %    20 - 24  HUM:      55.1  mm     G. Age:  32w 0d         45  %  Est. FW:    2061  gm      4 lb 9 oz     48  %      FW Discordancy      0 \ 2 % ---------------------------------------------------------------------- Gestational Age (Fetus B)  LMP:  34w 3d        Date:  06/10/18                 EDD:   03/17/19  U/S Today:     33w 1d                                        EDD:   03/26/19  Best:          32w 4d     Det. ByMarcella Dubs         EDD:   03/30/19                                      (08/04/18) ---------------------------------------------------------------------- Anatomy (Fetus B)  Cranium:               Appears normal         Aortic Arch:            Previously seen  Cavum:                 Appears normal         Ductal Arch:            Previously seen  Ventricles:            Appears normal         Diaphragm:              Previously seen  Choroid Plexus:        Appears normal         Stomach:                Appears normal, left                                                                        sided  Cerebellum:            Appears normal         Abdomen:                Previously seen  Posterior Fossa:       Appears normal         Abdominal Wall:         Previously seen  Nuchal Fold:           Previously seen        Cord Vessels:           Previously seen  Face:                  Orbits and profile     Kidneys:                Appear normal                         previously seen  Lips:                  Previously seen  Bladder:                Appears normal  Thoracic:              Appears normal         Spine:                  Previously seen  Heart:                 Previously seen        Upper Extremities:      Previously seen  RVOT:                  Previously seen        Lower Extremities:      Previously seen  LVOT:                  Previously seen  Other:  Heels and 5th digit visualized previously. ---------------------------------------------------------------------- Cervix Uterus Adnexa  Cervix  Not visualized (advanced GA >24wks)  Uterus  No abnormality visualized.  Left Ovary  Within  normal limits.  Right Ovary  Within normal limits.  Cul De Sac  No free fluid seen.  Adnexa  No abnormality visualized. ---------------------------------------------------------------------- Impression  Dichorionic-diamniotic twin pregnancy.  Twin A: Maternal right, cephalic, anterior placenta. Amniotic  fluid is normal and good fetal activity is seen. Fetal growth is  appropriate for gestational age.  Twin B: Maternal left, breech, posterior placenta. Amniotic  fluid is normal and good fetal activity is seen. Fetal growth is  appropriate for gestational age.  Growth discordancy: 2% (normal).  Patient does not have gestational diabetes. ---------------------------------------------------------------------- Recommendations  An appointment was made for her to return in 4 weeks for  fetal growth assessment. ----------------------------------------------------------------------                  Tama High, MD Electronically Signed Final Report   02/06/2019 12:05 pm ----------------------------------------------------------------------   Assessment and Plan:  Pregnancy: [id number] at [redacted]w[redacted]d 1. Dichorionic diamniotic twin pregnancy in third trimester Doing well, continue scans/antenatal testing as per MFM.  2. Supervision of high risk pregnancy, antepartum Preterm labor symptoms and general obstetric precautions including but not limited to vaginal bleeding, contractions, leaking of fluid and fetal movement were reviewed in detail with the patient. I discussed the assessment and treatment plan with the patient. The patient was provided an opportunity to ask questions and all were answered. The patient agreed with the plan and demonstrated an understanding of the instructions. The patient was advised to call back or seek an in-person office evaluation/go to MAU at [facility] for any urgent or concerning symptoms. Please refer to After Visit Summary for other counseling recommendations.   I  provided 15 minutes of face-to-face time during this encounter.  Return in about 12 days (around 03/03/2019) for OFFICE 36 week OB Visit, Pelvic cultures (also has MFM scan same day).  Future Appointments  Date Time Provider [place]  03/03/2019  1:30 PM [place] [facility]-US  03/03/2019  1:30 PM [place] 1 WH-MFCUS MFC-US  03/03/2019  3:15 PM Haskell Rihn, Sallyanne Havers, MD WOC-WOCA WOC  03/11/2019 10:15 AM WOC-WOCA NST WOC-WOCA WOC  03/11/2019 11:15 AM Donnamae Jude, MD WOC-WOCA WOC  03/15/2019  8:15 AM WOC-WOCA NST WOC-WOCA WOC  03/15/2019  9:15 AM Faye Sanfilippo, Sallyanne Havers, MD [place] Royanne Warshaw, MD [facility] [place], [place]

## 2019-02-19 NOTE — Progress Notes (Signed)
I connected with  Latoya Palmer on 02/19/19 at  9:55 AM EDT by telephone and verified that I am speaking with the correct person using two identifiers.   I discussed the limitations, risks, security and privacy concerns of performing an evaluation and management service by telephone and the availability of in person appointments. I also discussed with the patient that there may be a patient responsible charge related to this service. The patient expressed understanding and agreed to proceed.  Verdell Carmine, RN 02/19/2019  10:00 AM

## 2019-02-19 NOTE — Patient Instructions (Signed)
Return to office for any scheduled appointments. Call the office or go to the MA[facility] at [facility] [place] if:  You begin to have strong, frequent contractions  Your water breaks.  Sometimes it is a big gush of fluid, sometimes it is just a trickle that keeps getting your panties wet or running down your legs  You have vaginal bleeding.  It is normal to have a small amount of spotting if your cervix was checked.   You do not feel your baby moving like normal.  If you do not, get something to eat and drink and lay down and focus on feeling your baby move.   If your baby is still not moving like normal, you should call the office or go to MAU.  Any other obstetric concerns.   

## 2019-02-26 ENCOUNTER — Telehealth: Payer: Medicaid Other | Admitting: Obstetrics & Gynecology

## 2019-03-03 ENCOUNTER — Encounter (HOSPITAL_COMMUNITY): Payer: Self-pay

## 2019-03-03 ENCOUNTER — Encounter: Payer: Self-pay | Admitting: Obstetrics & Gynecology

## 2019-03-03 ENCOUNTER — Ambulatory Visit (HOSPITAL_COMMUNITY)
Admission: RE | Admit: 2019-03-03 | Discharge: 2019-03-03 | Disposition: A | Discharge status: Home / Self Care | Payer: Medicaid Other | Source: Ambulatory Visit | Attending: Obstetrics and Gynecology | Admitting: Obstetrics and Gynecology

## 2019-03-03 ENCOUNTER — Ambulatory Visit (INDEPENDENT_AMBULATORY_CARE_PROVIDER_SITE_OTHER): Payer: Medicaid Other | Admitting: Obstetrics & Gynecology

## 2019-03-03 ENCOUNTER — Ambulatory Visit (HOSPITAL_COMMUNITY): Payer: Medicaid Other | Admitting: *Deleted

## 2019-03-03 ENCOUNTER — Other Ambulatory Visit (HOSPITAL_COMMUNITY)
Admission: RE | Admit: 2019-03-03 | Discharge: 2019-03-03 | Disposition: A | Discharge status: Home / Self Care | Payer: Medicaid Other | Source: Ambulatory Visit | Attending: Obstetrics & Gynecology | Admitting: Obstetrics & Gynecology

## 2019-03-03 ENCOUNTER — Other Ambulatory Visit: Payer: Self-pay | Admitting: Obstetrics and Gynecology

## 2019-03-03 ENCOUNTER — Other Ambulatory Visit: Payer: Self-pay

## 2019-03-03 ENCOUNTER — Other Ambulatory Visit (HOSPITAL_COMMUNITY): Payer: Self-pay | Admitting: *Deleted

## 2019-03-03 VITALS — BP 122/73 | HR 86 | Temp 98.4°F | Wt 181.7 lb

## 2019-03-03 DIAGNOSIS — Z3A36 36 weeks gestation of pregnancy: Secondary | ICD-10-CM

## 2019-03-03 DIAGNOSIS — O30049 Twin pregnancy, dichorionic/diamniotic, unspecified trimester: Secondary | ICD-10-CM

## 2019-03-03 DIAGNOSIS — O99013 Anemia complicating pregnancy, third trimester: Secondary | ICD-10-CM

## 2019-03-03 DIAGNOSIS — O0990 Supervision of high risk pregnancy, unspecified, unspecified trimester: Secondary | ICD-10-CM | POA: Insufficient documentation

## 2019-03-03 DIAGNOSIS — O30043 Twin pregnancy, dichorionic/diamniotic, third trimester: Secondary | ICD-10-CM | POA: Insufficient documentation

## 2019-03-03 DIAGNOSIS — Z362 Encounter for other antenatal screening follow-up: Secondary | ICD-10-CM

## 2019-03-03 DIAGNOSIS — O3443 Maternal care for other abnormalities of cervix, third trimester: Secondary | ICD-10-CM | POA: Diagnosis not present

## 2019-03-03 DIAGNOSIS — O3441 Maternal care for other abnormalities of cervix, first trimester: Secondary | ICD-10-CM

## 2019-03-03 DIAGNOSIS — O3440 Maternal care for other abnormalities of cervix, unspecified trimester: Secondary | ICD-10-CM

## 2019-03-03 DIAGNOSIS — O0993 Supervision of high risk pregnancy, unspecified, third trimester: Secondary | ICD-10-CM

## 2019-03-03 LAB — OB RESULTS CONSOLE GBS: GBS: NEGATIVE

## 2019-03-03 NOTE — Patient Instructions (Signed)
Return to office for any scheduled appointments. Call the office or go to the MAU at [facility] [place] if:  You begin to have strong, frequent contractions  Your water breaks.  Sometimes it is a big gush of fluid, sometimes it is just a trickle that keeps getting your panties wet or running down your legs  You have vaginal bleeding.  It is normal to have a small amount of spotting if your cervix was checked.   You do not feel your baby moving like normal.  If you do not, get something to eat and drink and lay down and focus on feeling your baby move.   If your baby is still not moving like normal, you should call the office or go to MAU.  Any other obstetric concerns.   

## 2019-03-03 NOTE — Progress Notes (Signed)
PRENATAL VISIT NOTE  Subjective:  Latoya Palmer is a 33 y.o. [phone] at [redacted]w[redacted]d being seen today for ongoing prenatal care.  She is currently monitored for the following issues for this high-risk pregnancy and has History of cervical cone biopsy affecting care of mother in first trimester, antepartum; Dichorionic diamniotic twin gestation; Supervision of high risk pregnancy, antepartum; and Anemia in pregnancy on their problem list.  Patient reports occasional contractions.  Contractions: Irregular. Vag. Bleeding: None.  Movement: Present. Denies leaking of fluid.   The following portions of the patient's history were reviewed and updated as appropriate: allergies, current medications, past family history, past medical history, past social history, past surgical history and problem list.   Objective:   Vitals:   03/03/19 1536  BP: 122/73  Pulse: 86  Temp: 98.4 F (36.9 C)  Weight: 181 lb 11.2 oz (82.4 kg)    Fetal Status:     Movement: Present  Presentation: Vertex  General:  Alert, oriented and cooperative. Patient is in no acute distress.  Skin: Skin is warm and dry. No rash noted.   Cardiovascular: Normal heart rate noted  Respiratory: Normal respiratory effort, no problems with respiration noted  Abdomen: Soft, gravid, appropriate for gestational age.  Pain/Pressure: Present     Pelvic: Cervical exam performed Dilation: Fingertip Effacement (%): 50 Station: -3  Extremities: Normal range of motion.  Edema: Trace  Mental Status: Normal mood and affect. Normal behavior. Normal judgment and thought content.   Imaging: [place] Mfm Fetal Bpp Wo Non Stress  Result Date: 03/04/2019 ----------------------------------------------------------------------  OBSTETRICS REPORT                       (Signed Final 03/04/2019 12:38 pm) ---------------------------------------------------------------------- Patient Info  ID #:       [id number]                          D.O.B.:  03-Feb-1986 (32 yrs)   Name:       Latoya Palmer               Visit Date: 03/03/2019 01:39 pm ---------------------------------------------------------------------- Performed By  Performed By:     Eden Lathe BS      Ref. Address:      [address]                    [address]T                                                              Rd  Attending:        Lin Landsman      [facility]                    MD                                        Fetal Care  Referred By:      Conan Bowens  MD ---------------------------------------------------------------------- Orders   #  Description                          Code         Ordered By   1  [place] MFM OB FOLLOW UP                  56213.08     RAVI SHANKAR   2  [place] MFM OB FOLLOW UP ADDL             65784.69     RAVI SHANKAR      GEST   3  [place] MFM FETAL BPP WO NON              76819.01     KELLY DAVIS      STRESS   4  [place] MFM FETAL BPP WO NST              62952.8      KELLY DAVIS      ADDL GESTATION  ----------------------------------------------------------------------   #  Order #                    Accession #                 Episode #   1  [id number]                  [id number]   2  [id number]                  [id number]   3  [id number]                  [id number]                  [id number]   4  [id number]                  [id number]                  [id number]  ---------------------------------------------------------------------- Indications   Encounter for other antenatal screening        Z36.2   follow-up   Twin pregnancy, di/di, third trimester (low    O30.043   risk NIPS)   Previous cervical surgery (Cervical            O34.40   Conization with BX 2018)   [redacted] weeks gestation of pregnancy                Z3A.36  ---------------------------------------------------------------------- Fetal Evaluation (Fetus A)  Num Of Fetuses:          2  Fetal Heart Rate(bpm):   158  Cardiac  Activity:        Observed  Fetal Lie:               Maternal right side  Presentation:            Cephalic  Placenta:                Anterior  P. Cord Insertion:       Previously Visualized  Membrane Desc:      Dividing Membrane seen - Dichorionic.  Amniotic Fluid  AFI FV:      Within normal limits                              Largest Pocket(cm)                              6.89 ---------------------------------------------------------------------- Biophysical Evaluation (Fetus A)  Amniotic F.V:   Within normal limits       F. Tone:         Observed  F. Movement:    Observed                   Score:           8/8  F. Breathing:   Observed ---------------------------------------------------------------------- Biometry (Fetus A)  BPD:      88.3  mm     G. Age:  35w 5d         46  %    CI:        72.86   %    70 - 86                                                          FL/HC:       20.6  %    20.1 - 22.1  HC:      328.9  mm     G. Age:  37w 3d         51  %    HC/AC:       1.02       0.93 - 1.11  AC:      322.3  mm     G. Age:  36w 1d         61  %    FL/BPD:      76.6  %    71 - 87  FL:       67.6  mm     G. Age:  34w 5d         14  %    FL/AC:       21.0  %    20 - 24  HUM:      57.9  mm     G. Age:  33w 4d         20  %  Est. FW:    2789   gm     6 lb 2 oz     44  %     FW Discordancy         2  % ---------------------------------------------------------------------- OB History  Gravidity:    8         Term:   1         SAB:   3  TOP:          3        Living:  1 ---------------------------------------------------------------------- Gestational Age (Fetus A)  LMP:           38w 0d        Date:  06/10/18                 EDD:   03/17/19  U/S Today:  36w 0d                                        EDD:   03/31/19  Best:          36w 1d     Det. By:  Marcella Dubs         EDD:   03/30/19                                      (08/04/18) ---------------------------------------------------------------------- Anatomy  (Fetus A)  Cranium:               Appears normal         LVOT:                   Appears normal  Cavum:                 Previously seen        Aortic Arch:            Previously seen  Ventricles:            Previously seen        Ductal Arch:            Previously seen  Choroid Plexus:        Previously seen        Diaphragm:              Appears normal  Cerebellum:            Previously seen        Stomach:                Appears normal, left                                                                        sided  Posterior Fossa:       Previously seen        Abdomen:                Appears normal  Nuchal Fold:           Previously seen        Abdominal Wall:         Previously seen  Face:                  Appears normal         Cord Vessels:           Appears normal (3                         (orbits and profile)                           vessel cord)  Lips:                  Appears normal         Kidneys:  Appear normal  Palate:                Previously seen        Bladder:                Appears normal  Thoracic:              Appears normal         Spine:                  Previously seen  Heart:                 Appears normal         Upper Extremities:      Previously seen                         (4CH, axis, and                         situs)  RVOT:                  Appears normal         Lower Extremities:      Previously seen  Other:  Heels visualized previously. Nasal bone visualized previously. ---------------------------------------------------------------------- Fetal Evaluation (Fetus B)  Num Of Fetuses:          2  Fetal Heart Rate(bpm):   129  Cardiac Activity:        Observed  Fetal Lie:               Maternal left side  Presentation:            Cephalic  Placenta:                Posterior  P. Cord Insertion:       Previously Visualized  Membrane Desc:      Dividing Membrane seen - Dichorionic.  Amniotic Fluid  AFI FV:      Within normal limits                              Largest  Pocket(cm)                              6.89 ---------------------------------------------------------------------- Biophysical Evaluation (Fetus B)  Amniotic F.V:   Within normal limits       F. Tone:         Observed  F. Movement:    Observed                   Score:           8/8  F. Breathing:   Observed ---------------------------------------------------------------------- Biometry (Fetus B)  BPD:      88.4  mm     G. Age:  35w 5d         48  %    CI:        70.41   %    70 - 86  FL/HC:       20.2  %    20.1 - 22.1  HC:      335.9  mm     G. Age:  38w 3d         76  %    HC/AC:       1.04       0.93 - 1.11  AC:      322.7  mm     G. Age:  36w 1d         62  %    FL/BPD:      76.8  %    71 - 87  FL:       67.9  mm     G. Age:  34w 6d         17  %    FL/AC:       21.0  %    20 - 24  HUM:      58.8  mm     G. Age:  34w 0d         28  %  Est. FW:    2835   gm     6 lb 4 oz     49  %     FW Discordancy      0 \ 2 % ---------------------------------------------------------------------- Gestational Age (Fetus B)  LMP:           38w 0d        Date:  06/10/18                 EDD:   03/17/19  U/S Today:     36w 2d                                        EDD:   03/29/19  Best:          36w 1d     Det. ByMarcella Dubs         EDD:   03/30/19                                      (08/04/18) ---------------------------------------------------------------------- Anatomy (Fetus B)  Cranium:               Appears normal         Aortic Arch:            Previously seen  Cavum:                 Appears normal         Ductal Arch:            Previously seen  Ventricles:            Appears normal         Diaphragm:              Appears normal  Choroid Plexus:        Previously seen        Stomach:                Appears normal, left  sided  Cerebellum:            Appears normal         Abdomen:                 Appears normal  Posterior Fossa:       Appears normal         Abdominal Wall:         Previously seen  Nuchal Fold:           Previously seen        Cord Vessels:           Previously seen  Face:                  Appears normal         Kidneys:                Appear normal                         (orbits and profile)  Lips:                  Appears normal         Bladder:                Appears normal  Thoracic:              Appears normal         Spine:                  Previously seen  Heart:                 Appears normal         Upper Extremities:      Previously seen                         (4CH, axis, and                         situs)  RVOT:                  Appears normal         Lower Extremities:      Previously seen  LVOT:                  Appears normal  Other:  Heels and 5th digit visualized previously. ---------------------------------------------------------------------- Cervix Uterus Adnexa  Cervix  Not visualized (advanced GA >24wks)  Uterus  No abnormality visualized.  Left Ovary  Not visualized.  Right Ovary  Not visualized.  Cul De Sac  No free fluid seen.  Adnexa  No abnormality visualized. ---------------------------------------------------------------------- Impression  Diamniotic Dichorionic normal interval twin growth  Discordance within normal range  Biophysical profile 8/8 for Twin A & B ---------------------------------------------------------------------- Recommendations  Repeat testing in 1 week. ----------------------------------------------------------------------               Sander Nephew, MD Electronically Signed Final Report   03/04/2019 12:38 pm ----------------------------------------------------------------------  [place] Mfm Ob Follow Up  Result Date: 03/04/2019 ----------------------------------------------------------------------  OBSTETRICS REPORT                       (Signed Final 03/04/2019 12:38 pm)  ---------------------------------------------------------------------- Patient Info  ID #:       [id number]  D.O.B.:  1985/08/05 (32 yrs)  Name:       Latoya Palmer               Visit Date: 03/03/2019 01:39 pm ---------------------------------------------------------------------- Performed By  Performed By:     Eden Lathe BS      Ref. Address:      [address]                    [address]                                                              Rd  Attending:        Lin Landsman      [facility]                    MD                                        Fetal Care  Referred By:      Conan Bowens                    MD ---------------------------------------------------------------------- Orders   #  Description                          Code         Ordered By   1  [place] MFM OB FOLLOW UP                  16109.60     RAVI SHANKAR   2  [place] MFM OB FOLLOW UP ADDL             45409.81     RAVI SHANKAR      GEST   3  [place] MFM FETAL BPP WO NON              76819.01     KELLY DAVIS      STRESS   4  [place] MFM FETAL BPP WO NST              19147.8      KELLY DAVIS      ADDL GESTATION  ----------------------------------------------------------------------   #  Order #                    Accession #                 Episode #   1  [id number]                  [id number]                  [id number]   2  [id number]                  [id number]                  [id number]   3  [id number]   4  [id number]                  [id number]                  [id number]  ---------------------------------------------------------------------- Indications   Encounter for other antenatal screening        Z36.2   follow-up   Twin pregnancy, di/di, third trimester (low    O30.043   risk NIPS)   Previous cervical surgery (Cervical            O34.40   Conization with BX 2018)   [redacted] weeks gestation of pregnancy                Z3A.36   ---------------------------------------------------------------------- Fetal Evaluation (Fetus A)  Num Of Fetuses:          2  Fetal Heart Rate(bpm):   158  Cardiac Activity:        Observed  Fetal Lie:               Maternal right side  Presentation:            Cephalic  Placenta:                Anterior  P. Cord Insertion:       Previously Visualized  Membrane Desc:      Dividing Membrane seen - Dichorionic.  Amniotic Fluid  AFI FV:      Within normal limits                              Largest Pocket(cm)                              6.89 ---------------------------------------------------------------------- Biophysical Evaluation (Fetus A)  Amniotic F.V:   Within normal limits       F. Tone:         Observed  F. Movement:    Observed                   Score:           8/8  F. Breathing:   Observed ---------------------------------------------------------------------- Biometry (Fetus A)  BPD:      88.3  mm     G. Age:  35w 5d         46  %    CI:        72.86   %    70 - 86                                                          FL/HC:       20.6  %    20.1 - 22.1  HC:      328.9  mm     G. Age:  37w 3d         51  %    HC/AC:       1.02       0.93 - 1.11  AC:      322.3  mm  G. Age:  36w 1d         61  %    FL/BPD:      76.6  %    71 - 87  FL:       67.6  mm     G. Age:  34w 5d         14  %    FL/AC:       21.0  %    20 - 24  HUM:      57.9  mm     G. Age:  33w 4d         20  %  Est. FW:    2789   gm     6 lb 2 oz     44  %     FW Discordancy         2  % ---------------------------------------------------------------------- OB History  Gravidity:    8         Term:   1         SAB:   3  TOP:          3        Living:  1 ---------------------------------------------------------------------- Gestational Age (Fetus A)  LMP:           38w 0d        Date:  06/10/18                 EDD:   03/17/19  U/S Today:     36w 0d                                        EDD:   03/31/19  Best:          36w 1d     Det. ByMarcella Dubs         EDD:   03/30/19                                      (08/04/18) ---------------------------------------------------------------------- Anatomy (Fetus A)  Cranium:               Appears normal         LVOT:                   Appears normal  Cavum:                 Previously seen        Aortic Arch:            Previously seen  Ventricles:            Previously seen        Ductal Arch:            Previously seen  Choroid Plexus:        Previously seen        Diaphragm:              Appears normal  Cerebellum:            Previously seen        Stomach:                Appears normal, left  sided  Posterior Fossa:       Previously seen        Abdomen:                Appears normal  Nuchal Fold:           Previously seen        Abdominal Wall:         Previously seen  Face:                  Appears normal         Cord Vessels:           Appears normal (3                         (orbits and profile)                           vessel cord)  Lips:                  Appears normal         Kidneys:                Appear normal  Palate:                Previously seen        Bladder:                Appears normal  Thoracic:              Appears normal         Spine:                  Previously seen  Heart:                 Appears normal         Upper Extremities:      Previously seen                         (4CH, axis, and                         situs)  RVOT:                  Appears normal         Lower Extremities:      Previously seen  Other:  Heels visualized previously. Nasal bone visualized previously. ---------------------------------------------------------------------- Fetal Evaluation (Fetus B)  Num Of Fetuses:          2  Fetal Heart Rate(bpm):   129  Cardiac Activity:        Observed  Fetal Lie:               Maternal left side  Presentation:            Cephalic  Placenta:                Posterior  P. Cord Insertion:        Previously Visualized  Membrane Desc:      Dividing Membrane seen - Dichorionic.  Amniotic Fluid  AFI FV:      Within normal limits  Largest Pocket(cm)                              6.89 ---------------------------------------------------------------------- Biophysical Evaluation (Fetus B)  Amniotic F.V:   Within normal limits       F. Tone:         Observed  F. Movement:    Observed                   Score:           8/8  F. Breathing:   Observed ---------------------------------------------------------------------- Biometry (Fetus B)  BPD:      88.4  mm     G. Age:  35w 5d         48  %    CI:        70.41   %    70 - 86                                                          FL/HC:       20.2  %    20.1 - 22.1  HC:      335.9  mm     G. Age:  38w 3d         76  %    HC/AC:       1.04       0.93 - 1.11  AC:      322.7  mm     G. Age:  36w 1d         62  %    FL/BPD:      76.8  %    71 - 87  FL:       67.9  mm     G. Age:  34w 6d         17  %    FL/AC:       21.0  %    20 - 24  HUM:      58.8  mm     G. Age:  34w 0d         28  %  Est. FW:    2835   gm     6 lb 4 oz     49  %     FW Discordancy      0 \ 2 % ---------------------------------------------------------------------- Gestational Age (Fetus B)  LMP:           38w 0d        Date:  06/10/18                 EDD:   03/17/19  U/S Today:     36w 2d                                        EDD:   03/29/19  Best:          36w 1d     Det. ByMarcella Dubs         EDD:   03/30/19                                      (  08/04/18) ---------------------------------------------------------------------- Anatomy (Fetus B)  Cranium:               Appears normal         Aortic Arch:            Previously seen  Cavum:                 Appears normal         Ductal Arch:            Previously seen  Ventricles:            Appears normal         Diaphragm:              Appears normal  Choroid Plexus:        Previously seen        Stomach:                 Appears normal, left                                                                        sided  Cerebellum:            Appears normal         Abdomen:                Appears normal  Posterior Fossa:       Appears normal         Abdominal Wall:         Previously seen  Nuchal Fold:           Previously seen        Cord Vessels:           Previously seen  Face:                  Appears normal         Kidneys:                Appear normal                         (orbits and profile)  Lips:                  Appears normal         Bladder:                Appears normal  Thoracic:              Appears normal         Spine:                  Previously seen  Heart:                 Appears normal         Upper Extremities:      Previously seen                         (4CH, axis, and  situs)  RVOT:                  Appears normal         Lower Extremities:      Previously seen  LVOT:                  Appears normal  Other:  Heels and 5th digit visualized previously. ---------------------------------------------------------------------- Cervix Uterus Adnexa  Cervix  Not visualized (advanced GA >24wks)  Uterus  No abnormality visualized.  Left Ovary  Not visualized.  Right Ovary  Not visualized.  Cul De Sac  No free fluid seen.  Adnexa  No abnormality visualized. ---------------------------------------------------------------------- Impression  Diamniotic Dichorionic normal interval twin growth  Discordance within normal range  Biophysical profile 8/8 for Twin A & B ---------------------------------------------------------------------- Recommendations  Repeat testing in 1 week. ----------------------------------------------------------------------               Lin Landsman, MD Electronically Signed Final Report   03/04/2019 12:38 pm ----------------------------------------------------------------------  [place] Mfm Ob Follow Up Addl Gest  Result Date:  03/04/2019 ----------------------------------------------------------------------  OBSTETRICS REPORT                       (Signed Final 03/04/2019 12:38 pm) ---------------------------------------------------------------------- Patient Info  ID #:       [id number]                          D.O.B.:  Oct 31, 1985 (32 yrs)  Name:       Latoya Palmer               Visit Date: 03/03/2019 01:39 pm ---------------------------------------------------------------------- Performed By  Performed By:     Eden Lathe BS      Ref. Address:      [address]                    [address]                                                              Rd  Attending:        Lin Landsman      [facility]                    MD                                        Fetal Care  Referred By:      Conan Bowens                    MD ---------------------------------------------------------------------- Orders   #  Description                          Code         Ordered By   1  [place] MFM OB FOLLOW UP                  40981.19     RAVI [facility]  2  [place] MFM OB FOLLOW UP ADDL             [id number]     RAVI SHANKAR      GEST   3  [place] MFM FETAL BPP WO NON              76819.01     KELLY DAVIS      STRESS   4  [place] MFM FETAL BPP WO NST              40981.1      KELLY DAVIS      ADDL GESTATION  ----------------------------------------------------------------------   #  Order #                    Accession #                 Episode #   1  [id number]                  [id number]   2  [id number]                  [id number]                  [id number]   3  [id number]                  [id number]                  [id number]   4  [id number]                  [id number]                  [id number]  ---------------------------------------------------------------------- Indications   Encounter for other antenatal screening        Z36.2   follow-up   Twin pregnancy, di/di, third trimester (low    O30.043   risk  NIPS)   Previous cervical surgery (Cervical            O34.40   Conization with BX 2018)   [redacted] weeks gestation of pregnancy                Z3A.36  ---------------------------------------------------------------------- Fetal Evaluation (Fetus A)  Num Of Fetuses:          2  Fetal Heart Rate(bpm):   158  Cardiac Activity:        Observed  Fetal Lie:               Maternal right side  Presentation:            Cephalic  Placenta:                Anterior  P. Cord Insertion:       Previously Visualized  Membrane Desc:      Dividing Membrane seen - Dichorionic.  Amniotic Fluid  AFI FV:      Within normal limits                              Largest Pocket(cm)                              6.89 ---------------------------------------------------------------------- Biophysical Evaluation (Fetus A)  Amniotic  F.V:   Within normal limits       F. Tone:         Observed  F. Movement:    Observed                   Score:           8/8  F. Breathing:   Observed ---------------------------------------------------------------------- Biometry (Fetus A)  BPD:      88.3  mm     G. Age:  35w 5d         46  %    CI:        72.86   %    70 - 86                                                          FL/HC:       20.6  %    20.1 - 22.1  HC:      328.9  mm     G. Age:  37w 3d         51  %    HC/AC:       1.02       0.93 - 1.11  AC:      322.3  mm     G. Age:  36w 1d         61  %    FL/BPD:      76.6  %    71 - 87  FL:       67.6  mm     G. Age:  34w 5d         14  %    FL/AC:       21.0  %    20 - 24  HUM:      57.9  mm     G. Age:  33w 4d         20  %  Est. FW:    2789   gm     6 lb 2 oz     44  %     FW Discordancy         2  % ---------------------------------------------------------------------- OB History  Gravidity:    8         Term:   1         SAB:   3  TOP:          3        Living:  1 ---------------------------------------------------------------------- Gestational Age (Fetus A)  LMP:           38w 0d        Date:  06/10/18                  EDD:   03/17/19  U/S Today:     36w 0d                                        EDD:   03/31/19  Best:          36w 1d     Det. By:  Marcella DubsEarly Ultrasound  EDD:   03/30/19                                      (08/04/18) ---------------------------------------------------------------------- Anatomy (Fetus A)  Cranium:               Appears normal         LVOT:                   Appears normal  Cavum:                 Previously seen        Aortic Arch:            Previously seen  Ventricles:            Previously seen        Ductal Arch:            Previously seen  Choroid Plexus:        Previously seen        Diaphragm:              Appears normal  Cerebellum:            Previously seen        Stomach:                Appears normal, left                                                                        sided  Posterior Fossa:       Previously seen        Abdomen:                Appears normal  Nuchal Fold:           Previously seen        Abdominal Wall:         Previously seen  Face:                  Appears normal         Cord Vessels:           Appears normal (3                         (orbits and profile)                           vessel cord)  Lips:                  Appears normal         Kidneys:                Appear normal  Palate:                Previously seen        Bladder:                Appears normal  Thoracic:              Appears normal  Spine:                  Previously seen  Heart:                 Appears normal         Upper Extremities:      Previously seen                         (4CH, axis, and                         situs)  RVOT:                  Appears normal         Lower Extremities:      Previously seen  Other:  Heels visualized previously. Nasal bone visualized previously. ---------------------------------------------------------------------- Fetal Evaluation (Fetus B)  Num Of Fetuses:          2  Fetal Heart Rate(bpm):   129  Cardiac Activity:        Observed   Fetal Lie:               Maternal left side  Presentation:            Cephalic  Placenta:                Posterior  P. Cord Insertion:       Previously Visualized  Membrane Desc:      Dividing Membrane seen - Dichorionic.  Amniotic Fluid  AFI FV:      Within normal limits                              Largest Pocket(cm)                              6.89 ---------------------------------------------------------------------- Biophysical Evaluation (Fetus B)  Amniotic F.V:   Within normal limits       F. Tone:         Observed  F. Movement:    Observed                   Score:           8/8  F. Breathing:   Observed ---------------------------------------------------------------------- Biometry (Fetus B)  BPD:      88.4  mm     G. Age:  35w 5d         48  %    CI:        70.41   %    70 - 86                                                          FL/HC:       20.2  %    20.1 - 22.1  HC:      335.9  mm     G. Age:  38w 3d         76  %    HC/AC:       1.04       0.93 - 1.11  AC:  322.7  mm     G. Age:  36w 1d         62  %    FL/BPD:      76.8  %    71 - 87  FL:       67.9  mm     G. Age:  34w 6d         17  %    FL/AC:       21.0  %    20 - 24  HUM:      58.8  mm     G. Age:  34w 0d         28  %  Est. FW:    2835   gm     6 lb 4 oz     49  %     FW Discordancy      0 \ 2 % ---------------------------------------------------------------------- Gestational Age (Fetus B)  LMP:           38w 0d        Date:  06/10/18                 EDD:   03/17/19  U/S Today:     36w 2d                                        EDD:   03/29/19  Best:          36w 1d     Det. ByMarcella Dubs         EDD:   03/30/19                                      (08/04/18) ---------------------------------------------------------------------- Anatomy (Fetus B)  Cranium:               Appears normal         Aortic Arch:            Previously seen  Cavum:                 Appears normal         Ductal Arch:            Previously seen   Ventricles:            Appears normal         Diaphragm:              Appears normal  Choroid Plexus:        Previously seen        Stomach:                Appears normal, left                                                                        sided  Cerebellum:            Appears normal         Abdomen:  Appears normal  Posterior Fossa:       Appears normal         Abdominal Wall:         Previously seen  Nuchal Fold:           Previously seen        Cord Vessels:           Previously seen  Face:                  Appears normal         Kidneys:                Appear normal                         (orbits and profile)  Lips:                  Appears normal         Bladder:                Appears normal  Thoracic:              Appears normal         Spine:                  Previously seen  Heart:                 Appears normal         Upper Extremities:      Previously seen                         (4CH, axis, and                         situs)  RVOT:                  Appears normal         Lower Extremities:      Previously seen  LVOT:                  Appears normal  Other:  Heels and 5th digit visualized previously. ---------------------------------------------------------------------- Cervix Uterus Adnexa  Cervix  Not visualized (advanced GA >24wks)  Uterus  No abnormality visualized.  Left Ovary  Not visualized.  Right Ovary  Not visualized.  Cul De Sac  No free fluid seen.  Adnexa  No abnormality visualized. ---------------------------------------------------------------------- Impression  Diamniotic Dichorionic normal interval twin growth  Discordance within normal range  Biophysical profile 8/8 for Twin A & B ---------------------------------------------------------------------- Recommendations  Repeat testing in 1 week. ----------------------------------------------------------------------               Lin Landsman, MD Electronically Signed Final Report   03/04/2019 12:38 pm  ----------------------------------------------------------------------  [place] Mfm Fetal Bpp Wo Nst Addl Gestation  Result Date: 03/04/2019 ----------------------------------------------------------------------  OBSTETRICS REPORT                       (Signed Final 03/04/2019 12:38 pm) ---------------------------------------------------------------------- Patient Info  ID #:       [id number]                          D.O.B.:  04/23/1986 (32 yrs)  Name:       Latoya Palmer  Visit Date: 03/03/2019 01:39 pm ---------------------------------------------------------------------- Performed By  Performed By:     Eden Lathe BS      Ref. Address:      [address]                    [address]                                                              [address]  Attending:        Lin Landsman      Location[facility]                    MD                                        [facility]  Referred By:      Conan Bowens                    MD ---------------------------------------------------------------------- Orders   #  Description                          Code         Ordered By   1  [place] MFM OB FOLLOW UP                  40981.19     RAVI SHANKAR   2  [place] MFM OB FOLLOW UP ADDL             14782.95     RAVI SHANKAR      GEST   3  [place] MFM FETAL BPP WO NON              76819.01     KELLY DAVIS      STRESS   4  [place] MFM FETAL BPP WO NST              62130.8      KELLY DAVIS      ADDL GESTATION  ----------------------------------------------------------------------   #  Order #                    Accession #                 Episode #   1  [id number]   2  [id number]                  [id number]   3  [id number]                  [id number]   4  [id number]  [id number]                  [id number]  ---------------------------------------------------------------------- Indications    Encounter for other antenatal screening        Z36.2   follow-up   Twin pregnancy, di/di, third trimester (low    O30.043   risk NIPS)   Previous cervical surgery (Cervical            O34.40   Conization with BX 2018)   [redacted] weeks gestation of pregnancy                Z3A.36  ---------------------------------------------------------------------- Fetal Evaluation (Fetus A)  Num Of Fetuses:          2  Fetal Heart Rate(bpm):   158  Cardiac Activity:        Observed  Fetal Lie:               Maternal right side  Presentation:            Cephalic  Placenta:                Anterior  P. Cord Insertion:       Previously Visualized  Membrane Desc:      Dividing Membrane seen - Dichorionic.  Amniotic Fluid  AFI FV:      Within normal limits                              Largest Pocket(cm)                              6.89 ---------------------------------------------------------------------- Biophysical Evaluation (Fetus A)  Amniotic F.V:   Within normal limits       F. Tone:         Observed  F. Movement:    Observed                   Score:           8/8  F. Breathing:   Observed ---------------------------------------------------------------------- Biometry (Fetus A)  BPD:      88.3  mm     G. Age:  35w 5d         46  %    CI:        72.86   %    70 - 86                                                          FL/HC:       20.6  %    20.1 - 22.1  HC:      328.9  mm     G. Age:  37w 3d         51  %    HC/AC:       1.02       0.93 - 1.11  AC:      322.3  mm     G. Age:  36w 1d         61  %    FL/BPD:      76.6  %    71 - 87  FL:  67.6  mm     G. Age:  34w 5d         14  %    FL/AC:       21.0  %    20 - 24  HUM:      57.9  mm     G. Age:  33w 4d         20  %  Est. FW:    2789   gm     6 lb 2 oz     44  %     FW Discordancy         2  % ---------------------------------------------------------------------- OB History  Gravidity:    8         Term:   1         SAB:   3  TOP:          3        Living:  1  ---------------------------------------------------------------------- Gestational Age (Fetus A)  LMP:           38w 0d        Date:  06/10/18                 EDD:   03/17/19  U/S Today:     36w 0d                                        EDD:   03/31/19  Best:          36w 1d     Det. ByMarcella Dubs:  Early Ultrasound         EDD:   03/30/19                                      (08/04/18) ---------------------------------------------------------------------- Anatomy (Fetus A)  Cranium:               Appears normal         LVOT:                   Appears normal  Cavum:                 Previously seen        Aortic Arch:            Previously seen  Ventricles:            Previously seen        Ductal Arch:            Previously seen  Choroid Plexus:        Previously seen        Diaphragm:              Appears normal  Cerebellum:            Previously seen        Stomach:                Appears normal, left  sided  Posterior Fossa:       Previously seen        Abdomen:                Appears normal  Nuchal Fold:           Previously seen        Abdominal Wall:         Previously seen  Face:                  Appears normal         Cord Vessels:           Appears normal (3                         (orbits and profile)                           vessel cord)  Lips:                  Appears normal         Kidneys:                Appear normal  Palate:                Previously seen        Bladder:                Appears normal  Thoracic:              Appears normal         Spine:                  Previously seen  Heart:                 Appears normal         Upper Extremities:      Previously seen                         (4CH, axis, and                         situs)  RVOT:                  Appears normal         Lower Extremities:      Previously seen  Other:  Heels visualized previously. Nasal bone visualized previously.  ---------------------------------------------------------------------- Fetal Evaluation (Fetus B)  Num Of Fetuses:          2  Fetal Heart Rate(bpm):   129  Cardiac Activity:        Observed  Fetal Lie:               Maternal left side  Presentation:            Cephalic  Placenta:                Posterior  P. Cord Insertion:       Previously Visualized  Membrane Desc:      Dividing Membrane seen - Dichorionic.  Amniotic Fluid  AFI FV:      Within normal limits  Largest Pocket(cm)                              6.89 ---------------------------------------------------------------------- Biophysical Evaluation (Fetus B)  Amniotic F.V:   Within normal limits       F. Tone:         Observed  F. Movement:    Observed                   Score:           8/8  F. Breathing:   Observed ---------------------------------------------------------------------- Biometry (Fetus B)  BPD:      88.4  mm     G. Age:  35w 5d         48  %    CI:        70.41   %    70 - 86                                                          FL/HC:       20.2  %    20.1 - 22.1  HC:      335.9  mm     G. Age:  38w 3d         76  %    HC/AC:       1.04       0.93 - 1.11  AC:      322.7  mm     G. Age:  36w 1d         62  %    FL/BPD:      76.8  %    71 - 87  FL:       67.9  mm     G. Age:  34w 6d         17  %    FL/AC:       21.0  %    20 - 24  HUM:      58.8  mm     G. Age:  34w 0d         28  %  Est. FW:    2835   gm     6 lb 4 oz     49  %     FW Discordancy      0 \ 2 % ---------------------------------------------------------------------- Gestational Age (Fetus B)  LMP:           38w 0d        Date:  06/10/18                 EDD:   03/17/19  U/S Today:     36w 2d                                        EDD:   03/29/19  Best:          36w 1d     Det. ByMarcella Dubs         EDD:   03/30/19                                      (  08/04/18) ---------------------------------------------------------------------- Anatomy  (Fetus B)  Cranium:               Appears normal         Aortic Arch:            Previously seen  Cavum:                 Appears normal         Ductal Arch:            Previously seen  Ventricles:            Appears normal         Diaphragm:              Appears normal  Choroid Plexus:        Previously seen        Stomach:                Appears normal, left                                                                        sided  Cerebellum:            Appears normal         Abdomen:                Appears normal  Posterior Fossa:       Appears normal         Abdominal Wall:         Previously seen  Nuchal Fold:           Previously seen        Cord Vessels:           Previously seen  Face:                  Appears normal         Kidneys:                Appear normal                         (orbits and profile)  Lips:                  Appears normal         Bladder:                Appears normal  Thoracic:              Appears normal         Spine:                  Previously seen  Heart:                 Appears normal         Upper Extremities:      Previously seen                         (4CH, axis, and  situs)  RVOT:                  Appears normal         Lower Extremities:      Previously seen  LVOT:                  Appears normal  Other:  Heels and 5th digit visualized previously. ---------------------------------------------------------------------- Cervix Uterus Adnexa  Cervix  Not visualized (advanced GA >24wks)  Uterus  No abnormality visualized.  Left Ovary  Not visualized.  Right Ovary  Not visualized.  Cul De Sac  No free fluid seen.  Adnexa  No abnormality visualized. ---------------------------------------------------------------------- Impression  Diamniotic Dichorionic normal interval twin growth  Discordance within normal range  Biophysical profile 8/8 for Twin A & B ---------------------------------------------------------------------- Recommendations  Repeat testing  in 1 week. ----------------------------------------------------------------------               Lin Landsman, MD Electronically Signed Final Report   03/04/2019 12:38 pm ----------------------------------------------------------------------   Assessment and Plan:  Pregnancy: [id number] at [redacted]w[redacted]d 1. Dichorionic diamniotic twin pregnancy in third trimester Patient had BPP of 8/8 x 2 today. No other complications.  IOL scheduled at 38 weeks.  Patient was very angry and reports I told her during her last virtual visit that she will be delivered at 37 weeks.  I told her that as per our MFM protocol, ACOG/SMFM recommendation, delivery will be induced at 38 weeks unless there was any complicating factors.  I even curbsided with Dr. Judeth Cornfield and he agreed there was no indication for early delivery.  Her cervix was also unfavorable. Discussed this with patient. She reiterated that "I am a black woman, and could die in child birth. I know my body and I want to be delivered next week. If you don't deliver me, please write in my chart that I asked to be delivered at 37 weeks and you refused, if anything bad happens to me". Patient then proceeded to storm out of the office.  2. Supervision of high risk pregnancy, antepartum Pelvic cultures done today. - Culture, beta strep (group b only) - GC/Chlamydia probe amp ([place])not at [facility]  Preterm labor symptoms and general obstetric precautions including but not limited to vaginal bleeding, contractions, leaking of fluid and fetal movement were reviewed in detail with the patient. Please refer to After Visit Summary for other counseling recommendations.   Return in about 1 week (around 03/10/2019) for Virtual OB Visit.  Future Appointments  Date Time Provider Department Center  03/10/2019  2:45 PM [facility] NURSE [facility]-M[facility] M[facility]-US  03/10/2019  2:45 PM WH-MFC [place] 2 WH-MFCUS MFC-US  03/11/2019 11:15 AM Reva Bores, MD [facility]OC  03/15/2019  9:15 AM Macon Large, Jethro Bastos, MD [facility] WOC  03/16/2019  7:30 AM [facility] SCHED ROOM MC-INDC None    Jaynie Collins, MD

## 2019-03-04 ENCOUNTER — Encounter (HOSPITAL_COMMUNITY): Payer: Self-pay

## 2019-03-04 ENCOUNTER — Ambulatory Visit (HOSPITAL_COMMUNITY): Payer: Medicaid Other

## 2019-03-05 LAB — GC/CHLAMYDIA PROBE AMP (~~LOC~~) NOT AT ARMC
Chlamydia: NEGATIVE
Neisseria Gonorrhea: NEGATIVE

## 2019-03-07 LAB — CULTURE, BETA STREP (GROUP B ONLY): Strep Gp B Culture: NEGATIVE

## 2019-03-08 ENCOUNTER — Telehealth (HOSPITAL_COMMUNITY): Payer: Self-pay | Admitting: *Deleted

## 2019-03-08 ENCOUNTER — Other Ambulatory Visit: Payer: Self-pay

## 2019-03-08 ENCOUNTER — Inpatient Hospital Stay (HOSPITAL_COMMUNITY)
Admission: AD | Admit: 2019-03-08 | Discharge: 2019-03-08 | Disposition: A | Discharge status: Home / Self Care | Payer: Medicaid Other | Attending: Obstetrics & Gynecology | Admitting: Obstetrics & Gynecology

## 2019-03-08 ENCOUNTER — Encounter (HOSPITAL_COMMUNITY): Payer: Self-pay

## 2019-03-08 DIAGNOSIS — O26893 Other specified pregnancy related conditions, third trimester: Secondary | ICD-10-CM | POA: Diagnosis not present

## 2019-03-08 DIAGNOSIS — Z0371 Encounter for suspected problem with amniotic cavity and membrane ruled out: Secondary | ICD-10-CM | POA: Diagnosis not present

## 2019-03-08 DIAGNOSIS — Z3A36 36 weeks gestation of pregnancy: Secondary | ICD-10-CM

## 2019-03-08 LAB — AMNISURE RUPTURE OF MEMBRANE (ROM) NOT AT ARMC: Amnisure ROM: NEGATIVE

## 2019-03-08 LAB — POCT FERN TEST: POCT Fern Test: NEGATIVE

## 2019-03-08 NOTE — MAU Provider Note (Signed)
First Provider Initiated Contact with Patient 03/08/19 1001     S: [name] is a 33 y.o. [phone] at [redacted]w[redacted]d  who presents to MAU today complaining of leaking of fluid since 0500. She denies vaginal bleeding. She endorses contractions. She reports normal fetal movement.    O: BP 113/60   Pulse 91   Temp 97.6 F (36.4 C) (Oral)   Resp 16   LMP 06/10/2018   SpO2 100%  GENERAL: Well-developed, well-nourished female in no acute distress.  HEAD: Normocephalic, atraumatic.  CHEST: Normal effort of breathing, regular heart rate ABDOMEN: Soft, nontender, gravid PELVIC: Normal external female genitalia. Vagina is pink and rugated. Cervix with normal contour, no lesions. Normal discharge.  negative pooling.   Cervical exam:  Dilation: Fingertip Effacement (%): 50 Cervical Position: Posterior Presentation: Undeterminable Exam by:: jaton burgess rnc  Fetal Tracing Fetus A Baseline: 125 bpm Variability: moderate  Accelerations: 15x15 Decelerations: None  Fetal Tracing Fetus B Baseline: 135 bpm Variability: moderate  Accelerations: 15x15 Decelerations: None  Results for orders placed or performed during the hospital encounter of 03/08/19 (from the past 24 hour(s))  POCT fern test     Status: None   Collection Time: 03/08/19  9:08 AM  Result Value Ref Range   POCT Fern Test Negative = intact amniotic membranes   Amnisure rupture of membrane (rom)not at [facility]     Status: None   Collection Time: 03/08/19 10:34 AM  Result Value Ref Range   Amnisure ROM NEGATIVE      A: [facility] at [redacted]w[redacted]d  Membranes intact  P:  Discharge home in stable condition Preterm labor precautions Return to MAU if symptoms worsen   Noni Saupe I, NP 03/08/2019 1:29 PM

## 2019-03-08 NOTE — Discharge Instructions (Signed)

## 2019-03-08 NOTE — MAU Note (Signed)
Pt states she noticed clear fluid leaking approx 0500 that has been persistent since then. Has noticed light pink discharge.  Denies UCs at this time, +FM x 2.

## 2019-03-08 NOTE — Telephone Encounter (Signed)
Preadmission screen  

## 2019-03-09 ENCOUNTER — Other Ambulatory Visit: Payer: Self-pay | Admitting: Advanced Practice Midwife

## 2019-03-10 ENCOUNTER — Ambulatory Visit (HOSPITAL_COMMUNITY)
Admission: RE | Admit: 2019-03-10 | Discharge: 2019-03-10 | Disposition: A | Discharge status: Home / Self Care | Payer: Medicaid Other | Source: Ambulatory Visit | Attending: Obstetrics and Gynecology | Admitting: Obstetrics and Gynecology

## 2019-03-10 ENCOUNTER — Encounter (HOSPITAL_COMMUNITY): Payer: Self-pay | Admitting: *Deleted

## 2019-03-10 ENCOUNTER — Telehealth: Payer: Self-pay | Admitting: Family Medicine

## 2019-03-10 ENCOUNTER — Other Ambulatory Visit: Payer: Self-pay

## 2019-03-10 ENCOUNTER — Ambulatory Visit (HOSPITAL_COMMUNITY): Payer: Medicaid Other | Admitting: *Deleted

## 2019-03-10 ENCOUNTER — Telehealth (HOSPITAL_COMMUNITY): Payer: Self-pay | Admitting: *Deleted

## 2019-03-10 ENCOUNTER — Other Ambulatory Visit (HOSPITAL_COMMUNITY): Payer: Self-pay | Admitting: Maternal & Fetal Medicine

## 2019-03-10 DIAGNOSIS — O30043 Twin pregnancy, dichorionic/diamniotic, third trimester: Secondary | ICD-10-CM | POA: Diagnosis not present

## 2019-03-10 DIAGNOSIS — Z3A37 37 weeks gestation of pregnancy: Secondary | ICD-10-CM

## 2019-03-10 DIAGNOSIS — O3443 Maternal care for other abnormalities of cervix, third trimester: Secondary | ICD-10-CM | POA: Diagnosis not present

## 2019-03-10 DIAGNOSIS — O99013 Anemia complicating pregnancy, third trimester: Secondary | ICD-10-CM

## 2019-03-10 DIAGNOSIS — O0990 Supervision of high risk pregnancy, unspecified, unspecified trimester: Secondary | ICD-10-CM | POA: Diagnosis not present

## 2019-03-10 DIAGNOSIS — O30049 Twin pregnancy, dichorionic/diamniotic, unspecified trimester: Secondary | ICD-10-CM

## 2019-03-10 DIAGNOSIS — O3441 Maternal care for other abnormalities of cervix, first trimester: Secondary | ICD-10-CM | POA: Insufficient documentation

## 2019-03-10 NOTE — Telephone Encounter (Signed)
Patient called to cancel her appointment, I offered to reschedule and she did not want to.

## 2019-03-10 NOTE — Telephone Encounter (Signed)
Preadmission screen  

## 2019-03-11 ENCOUNTER — Encounter: Payer: Medicaid Other | Admitting: Family Medicine

## 2019-03-11 ENCOUNTER — Other Ambulatory Visit: Payer: Medicaid Other

## 2019-03-14 ENCOUNTER — Other Ambulatory Visit (HOSPITAL_COMMUNITY)
Admission: RE | Admit: 2019-03-14 | Discharge: 2019-03-14 | Disposition: A | Discharge status: Home / Self Care | Payer: Medicaid Other | Source: Ambulatory Visit | Attending: Obstetrics and Gynecology | Admitting: Obstetrics and Gynecology

## 2019-03-14 ENCOUNTER — Other Ambulatory Visit: Payer: Self-pay

## 2019-03-14 DIAGNOSIS — Z20828 Contact with and (suspected) exposure to other viral communicable diseases: Secondary | ICD-10-CM | POA: Insufficient documentation

## 2019-03-14 DIAGNOSIS — Z01812 Encounter for preprocedural laboratory examination: Secondary | ICD-10-CM | POA: Insufficient documentation

## 2019-03-14 LAB — SARS CORONAVIRUS 2 (TAT 6-24 HRS): SARS Coronavirus 2: NEGATIVE

## 2019-03-14 NOTE — MAU Note (Signed)
Pt here for PAT covid swab, denies any symptoms. Swab collected 

## 2019-03-15 ENCOUNTER — Encounter: Payer: Medicaid Other | Admitting: Obstetrics & Gynecology

## 2019-03-15 ENCOUNTER — Encounter: Payer: Self-pay | Admitting: Family Medicine

## 2019-03-15 ENCOUNTER — Other Ambulatory Visit: Payer: Medicaid Other

## 2019-03-16 ENCOUNTER — Inpatient Hospital Stay (HOSPITAL_COMMUNITY): Payer: Medicaid Other

## 2019-03-16 ENCOUNTER — Inpatient Hospital Stay (HOSPITAL_COMMUNITY): Payer: Medicaid Other | Admitting: Anesthesiology

## 2019-03-16 ENCOUNTER — Encounter (HOSPITAL_COMMUNITY): Payer: Self-pay | Admitting: *Deleted

## 2019-03-16 ENCOUNTER — Inpatient Hospital Stay (HOSPITAL_COMMUNITY)
Admission: AD | Admit: 2019-03-16 | Discharge: 2019-03-18 | DRG: 806 | Disposition: A | Discharge status: Home / Self Care | Payer: Medicaid Other | Attending: Obstetrics and Gynecology | Admitting: Obstetrics and Gynecology

## 2019-03-16 DIAGNOSIS — O30043 Twin pregnancy, dichorionic/diamniotic, third trimester: Secondary | ICD-10-CM | POA: Diagnosis not present

## 2019-03-16 DIAGNOSIS — Z3A00 Weeks of gestation of pregnancy not specified: Secondary | ICD-10-CM | POA: Diagnosis not present

## 2019-03-16 DIAGNOSIS — O30003 Twin pregnancy, unspecified number of placenta and unspecified number of amniotic sacs, third trimester: Secondary | ICD-10-CM | POA: Diagnosis present

## 2019-03-16 DIAGNOSIS — O321XX2 Maternal care for breech presentation, fetus 2: Secondary | ICD-10-CM | POA: Diagnosis not present

## 2019-03-16 DIAGNOSIS — D649 Anemia, unspecified: Secondary | ICD-10-CM | POA: Diagnosis not present

## 2019-03-16 DIAGNOSIS — Z87891 Personal history of nicotine dependence: Secondary | ICD-10-CM | POA: Diagnosis not present

## 2019-03-16 DIAGNOSIS — O99019 Anemia complicating pregnancy, unspecified trimester: Secondary | ICD-10-CM | POA: Diagnosis present

## 2019-03-16 DIAGNOSIS — O9902 Anemia complicating childbirth: Secondary | ICD-10-CM | POA: Diagnosis present

## 2019-03-16 DIAGNOSIS — Z3A38 38 weeks gestation of pregnancy: Secondary | ICD-10-CM | POA: Diagnosis not present

## 2019-03-16 DIAGNOSIS — O0990 Supervision of high risk pregnancy, unspecified, unspecified trimester: Secondary | ICD-10-CM

## 2019-03-16 DIAGNOSIS — Z20828 Contact with and (suspected) exposure to other viral communicable diseases: Secondary | ICD-10-CM | POA: Diagnosis not present

## 2019-03-16 DIAGNOSIS — O3441 Maternal care for other abnormalities of cervix, first trimester: Secondary | ICD-10-CM

## 2019-03-16 DIAGNOSIS — O30049 Twin pregnancy, dichorionic/diamniotic, unspecified trimester: Secondary | ICD-10-CM | POA: Diagnosis present

## 2019-03-16 LAB — CBC
HCT: 30.6 % — ABNORMAL LOW (ref 36.0–46.0)
Hemoglobin: 10 g/dL — ABNORMAL LOW (ref 12.0–15.0)
MCH: 27 pg (ref 26.0–34.0)
MCHC: 32.7 g/dL (ref 30.0–36.0)
MCV: 82.7 fL (ref 80.0–100.0)
Platelets: 173 10*3/uL (ref 150–400)
RBC: 3.7 MIL/uL — ABNORMAL LOW (ref 3.87–5.11)
RDW: 15.7 % — ABNORMAL HIGH (ref 11.5–15.5)
WBC: 8.1 10*3/uL (ref 4.0–10.5)
nRBC: 0 % (ref 0.0–0.2)

## 2019-03-16 LAB — TYPE AND SCREEN
ABO/RH(D): O POS
Antibody Screen: NEGATIVE

## 2019-03-16 LAB — COMPREHENSIVE METABOLIC PANEL
ALT: 11 U/L (ref 0–44)
AST: 20 U/L (ref 15–41)
Albumin: 2.4 g/dL — ABNORMAL LOW (ref 3.5–5.0)
Alkaline Phosphatase: 795 U/L — ABNORMAL HIGH (ref 38–126)
Anion gap: 9 (ref 5–15)
BUN: <5 mg/dL — ABNORMAL LOW (ref 6–20)
CO2: 19 mmol/L — ABNORMAL LOW (ref 22–32)
Calcium: 8.5 mg/dL — ABNORMAL LOW (ref 8.9–10.3)
Chloride: 108 mmol/L (ref 98–111)
Creatinine, Ser: 0.81 mg/dL (ref 0.44–1.00)
GFR calc Af Amer: >60 mL/min (ref >60)
GFR calc non Af Amer: >60 mL/min (ref >60)
Glucose, Bld: 73 mg/dL (ref 70–99)
Potassium: 3.8 mmol/L (ref 3.5–5.1)
Sodium: 136 mmol/L (ref 135–145)
Total Bilirubin: 0.6 mg/dL (ref 0.3–1.2)
Total Protein: 5.4 g/dL — ABNORMAL LOW (ref 6.5–8.1)

## 2019-03-16 LAB — RPR: RPR Ser Ql: NONREACTIVE

## 2019-03-16 MED ORDER — LACTATED RINGERS IV SOLN
500.0000 mL | Freq: Once | INTRAVENOUS | Status: AC
  Administered 2019-03-16: 16:00:00 500 mL via INTRAVENOUS

## 2019-03-16 MED ORDER — OXYTOCIN BOLUS FROM INFUSION
500.0000 mL | Freq: Once | INTRAVENOUS | Status: AC
  Administered 2019-03-17: 500 mL via INTRAVENOUS

## 2019-03-16 MED ORDER — LIDOCAINE HCL (PF) 1 % IJ SOLN: 30.0000 mL | INTRAMUSCULAR | Status: DC | PRN

## 2019-03-16 MED ORDER — FLEET ENEMA 7-19 GM/118ML RE ENEM: 1.0000 | ENEMA | Freq: Every day | RECTAL | Status: DC | PRN

## 2019-03-16 MED ORDER — PHENYLEPHRINE 40 MCG/ML (10ML) SYRINGE FOR IV PUSH (FOR BLOOD PRESSURE SUPPORT): 80.0000 ug | PREFILLED_SYRINGE | INTRAVENOUS | Status: DC | PRN

## 2019-03-16 MED ORDER — LACTATED RINGERS IV SOLN
INTRAVENOUS | Status: DC
  Administered 2019-03-16 (×2): via INTRAVENOUS

## 2019-03-16 MED ORDER — OXYTOCIN 40 UNITS IN NORMAL SALINE INFUSION - SIMPLE MED
2.5000 [IU]/h | INTRAVENOUS | Status: DC
  Administered 2019-03-17: 13:00:00 2.5 [IU]/h via INTRAVENOUS
  Filled 2019-03-16: qty 1000

## 2019-03-16 MED ORDER — SOD CITRATE-CITRIC ACID 500-334 MG/5ML PO SOLN
30.0000 mL | ORAL | Status: DC | PRN
  Filled 2019-03-16: qty 30

## 2019-03-16 MED ORDER — ZOLPIDEM TARTRATE 5 MG PO TABS: 5.0000 mg | ORAL_TABLET | Freq: Every evening | ORAL | Status: DC | PRN

## 2019-03-16 MED ORDER — EPHEDRINE 5 MG/ML INJ: 10.0000 mg | INTRAVENOUS | Status: DC | PRN

## 2019-03-16 MED ORDER — MISOPROSTOL 25 MCG QUARTER TABLET: 25.0000 ug | ORAL_TABLET | ORAL | Status: DC | PRN

## 2019-03-16 MED ORDER — LIDOCAINE-EPINEPHRINE (PF) 2 %-1:200000 IJ SOLN
INTRAMUSCULAR | Status: DC | PRN
  Administered 2019-03-16 (×2): 3 mL via EPIDURAL
  Administered 2019-03-17: 5 mL via EPIDURAL

## 2019-03-16 MED ORDER — HYDROXYZINE HCL 50 MG PO TABS: 50.0000 mg | ORAL_TABLET | Freq: Four times a day (QID) | ORAL | Status: DC | PRN

## 2019-03-16 MED ORDER — DIPHENHYDRAMINE HCL 50 MG/ML IJ SOLN: 12.5000 mg | INTRAMUSCULAR | Status: DC | PRN

## 2019-03-16 MED ORDER — OXYTOCIN 40 UNITS IN NORMAL SALINE INFUSION - SIMPLE MED
1.0000 m[IU]/min | INTRAVENOUS | Status: DC
  Administered 2019-03-16: 09:00:00 2 m[IU]/min via INTRAVENOUS
  Administered 2019-03-17: 05:00:00 6 m[IU]/min via INTRAVENOUS
  Filled 2019-03-16: qty 1000

## 2019-03-16 MED ORDER — OXYCODONE-ACETAMINOPHEN 5-325 MG PO TABS: 2.0000 | ORAL_TABLET | ORAL | Status: DC | PRN

## 2019-03-16 MED ORDER — ONDANSETRON HCL 4 MG/2ML IJ SOLN: 4.0000 mg | Freq: Four times a day (QID) | INTRAMUSCULAR | Status: DC | PRN

## 2019-03-16 MED ORDER — SODIUM CHLORIDE (PF) 0.9 % IJ SOLN
INTRAMUSCULAR | Status: DC | PRN
  Administered 2019-03-16: 12 mL/h via EPIDURAL

## 2019-03-16 MED ORDER — FENTANYL-BUPIVACAINE-NACL 0.5-0.125-0.9 MG/250ML-% EP SOLN
12.0000 mL/h | EPIDURAL | Status: DC | PRN
  Filled 2019-03-16 (×2): qty 250

## 2019-03-16 MED ORDER — ACETAMINOPHEN 325 MG PO TABS
650.0000 mg | ORAL_TABLET | ORAL | Status: DC | PRN
  Administered 2019-03-17: 05:00:00 650 mg via ORAL
  Filled 2019-03-16: qty 2

## 2019-03-16 MED ORDER — OXYCODONE-ACETAMINOPHEN 5-325 MG PO TABS: 1.0000 | ORAL_TABLET | ORAL | Status: DC | PRN

## 2019-03-16 MED ORDER — LACTATED RINGERS IV SOLN
500.0000 mL | INTRAVENOUS | Status: DC | PRN
  Administered 2019-03-17: 11:00:00 500 mL via INTRAVENOUS

## 2019-03-16 MED ORDER — FENTANYL CITRATE (PF) 100 MCG/2ML IJ SOLN
50.0000 ug | INTRAMUSCULAR | Status: DC | PRN
  Administered 2019-03-17: 07:00:00 50 ug via INTRAVENOUS
  Administered 2019-03-17: 05:00:00 100 ug via INTRAVENOUS
  Filled 2019-03-16 (×2): qty 2

## 2019-03-16 MED ORDER — TERBUTALINE SULFATE 1 MG/ML IJ SOLN: 0.2500 mg | Freq: Once | INTRAMUSCULAR | Status: DC | PRN

## 2019-03-16 NOTE — Anesthesia Procedure Notes (Signed)
Epidural Patient location during procedure: OB Start time: 03/16/2019 3:45 PM End time: 03/16/2019 4:00 PM  Staffing Anesthesiologist: Freddrick March, MD Performed: anesthesiologist   Preanesthetic Checklist Completed: patient identified, pre-op evaluation, timeout performed, IV checked, risks and benefits discussed and monitors and equipment checked  Epidural Patient position: sitting Prep: site prepped and draped and DuraPrep Patient monitoring: continuous pulse ox, blood pressure, heart rate and cardiac monitor Approach: midline Location: L3-L4 Injection technique: LOR air  Needle:  Needle type: Tuohy  Needle gauge: 17 G Needle length: 9 cm Needle insertion depth: 6 cm Catheter type: closed end flexible Catheter size: 19 Gauge Catheter at skin depth: 11 cm Test dose: negative  Assessment Sensory level: T8 Events: blood not aspirated, injection not painful, no injection resistance, negative IV test and no paresthesia  Additional Notes Patient identified. Risks/Benefits/Options discussed with patient including but not limited to bleeding, infection, nerve damage, paralysis, failed block, incomplete pain control, headache, blood pressure changes, nausea, vomiting, reactions to medication both or allergic, itching and postpartum back pain. Confirmed with bedside nurse the patient's most recent platelet count. Confirmed with patient that they are not currently taking any anticoagulation, have any bleeding history or any family history of bleeding disorders. Patient expressed understanding and wished to proceed. All questions were answered. Sterile technique was used throughout the entire procedure. Please see nursing notes for vital signs. Test dose was given through epidural catheter and negative prior to continuing to dose epidural or start infusion. Warning signs of high block given to the patient including shortness of breath, tingling/numbness in hands, complete motor block,  or any concerning symptoms with instructions to call for help. Patient was given instructions on fall risk and not to get out of bed. All questions and concerns addressed with instructions to call with any issues or inadequate analgesia.  Reason for block:procedure for pain

## 2019-03-16 NOTE — Anesthesia Preprocedure Evaluation (Signed)
Anesthesia Evaluation  Patient identified by MRN, date of birth, ID band Patient awake    Reviewed: Allergy & Precautions, NPO status , Patient's Chart, lab work & pertinent test results  Airway Mallampati: II  TM Distance: >3 FB Neck ROM: Full    Dental no notable dental hx.    Pulmonary neg pulmonary ROS, former smoker,    Pulmonary exam normal breath sounds clear to auscultation       Cardiovascular negative cardio ROS Normal cardiovascular exam Rhythm:Regular Rate:Normal     Neuro/Psych negative neurological ROS  negative psych ROS   GI/Hepatic negative GI ROS, Neg liver ROS,   Endo/Other  negative endocrine ROS  Renal/GU negative Renal ROS  negative genitourinary   Musculoskeletal negative musculoskeletal ROS (+)   Abdominal   Peds  Hematology  (+) Blood dyscrasia (Hgb 10), anemia ,   Anesthesia Other Findings Di/di twins  Reproductive/Obstetrics (+) Pregnancy                             Anesthesia Physical Anesthesia Plan  ASA: III  Anesthesia Plan: Epidural   Post-op Pain Management:    Induction:   PONV Risk Score and Plan: Treatment may vary due to age or medical condition  Airway Management Planned: Natural Airway  Additional Equipment:   Intra-op Plan:   Post-operative Plan:   Informed Consent: I have reviewed the patients History and Physical, chart, labs and discussed the procedure including the risks, benefits and alternatives for the proposed anesthesia with the patient or authorized representative who has indicated his/her understanding and acceptance.       Plan Discussed with: Anesthesiologist  Anesthesia Plan Comments: (Patient identified. Risks, benefits, options discussed with patient including but not limited to bleeding, infection, nerve damage, paralysis, failed block, incomplete pain control, headache, blood pressure changes, nausea, vomiting,  reactions to medication, itching, and post partum back pain. Confirmed with bedside nurse the patient's most recent platelet count. Confirmed with the patient that they are not taking any anticoagulation, have any bleeding history or any family history of bleeding disorders. Patient expressed understanding and wishes to proceed. All questions were answered. )        Anesthesia Quick Evaluation

## 2019-03-16 NOTE — H&P (Signed)
LABOR AND DELIVERY ADMISSION HISTORY AND PHYSICAL NOTE  Latoya Palmer is a 33 y.o. female [phone] with IUP at 6628w0d by 5wk US presenting for IOL for multifetal gestation.   She reports positive fetal movement. She denies leakage of fluid or vaginal bleeding.  Prenatal History/Complications: [facility] Pregnancy complications:  - di/di twin gestation - anemia  Past Medical History: Past Medical History:  Diagnosis Date  . Abnormal Pap smear   . Anemia    with pregnancy  . Bacterial vaginosis   . Headache(784.0)    hx - last one 09/2016 -otc prn  . Heart murmur    hx - no problems; June - heart u/s "left chamber enlarged"  . HGSIL (high grade squamous intraepithelial lesion) on Pap smear of cervix 07/19/2016   [ ]  Colposcopy and ECC  . Miscarriage 09/17/2013   07/2013 - miscarriage - reeval BHCG to ensure resolved.   . Ovarian cyst   . Pneumonia 2018  . Preterm labor   . SVD (spontaneous vaginal delivery) 2008   x 1  . Vaginal Pap smear, abnormal     Past Surgical History: Past Surgical History:  Procedure Laterality Date  . CERVICAL CONIZATION W/BX N/A 11/05/2016   Procedure: CONIZATION CERVIX WITH BIOPSY - COLD KNIFE;  Surgeon: Allie BossierMyra C Dove, MD;  Location: [facility];  Service: Gynecology;  Laterality: N/A;  . COLPOSCOPY    . MYRINGOTOMY    . THERAPEUTIC ABORTION     two    Obstetrical History: OB History    Gravida  8   Para  1   Term  1   Preterm  0   AB  6   Living  1     SAB  3   TAB  3   Ectopic  0   Multiple  0   Live Births  1           Social History: Social History   Socioeconomic History  . Marital status: Single    Spouse name: Not on file  . Number of children: Not on file  . Years of education: Not on file  . Highest education level: Not on file  Occupational History  . Not on file  Social Needs  . Financial resource strain: Not hard at all  . Food insecurity    Worry: Never true    Inability: Never true  .  Transportation needs    Medical: No    Non-medical: Not on file  Tobacco Use  . Smoking status: Former Smoker    Packs/day: 0.50    Years: 11.00    Pack years: 5.50    Types: Cigarettes    Quit date: 07/24/2018    Years since quitting: 0.6  . Smokeless tobacco: Never Used  Substance and Sexual Activity  . Alcohol use: No  . Drug use: No  . Sexual activity: Yes    Birth control/protection: None  Lifestyle  . Physical activity    Days per week: Not on file    Minutes per session: Not on file  . Stress: Very much  Relationships  . Social Musicianconnections    Talks on phone: Not on file    Gets together: Not on file    Attends religious service: Not on file    Active member of club or organization: Not on file    Attends meetings of clubs or organizations: Not on file    Relationship status: Not on file  Other Topics  Concern  . Not on file  Social History Narrative  . Not on file    Family History: Family History  Problem Relation Age of Onset  . Heart attack Maternal Grandmother 50  . Cancer Neg Hx   . Diabetes Neg Hx   . Kidney disease Neg Hx   . Hypertension Neg Hx     Allergies: Allergies  Allergen Reactions  . Latex Swelling and Other (See Comments)    Causes irritation    Medications Prior to Admission  Medication Sig Dispense Refill Last Dose  . aspirin EC 81 MG tablet Take 1 tablet (81 mg total) by mouth daily. Take after 12 weeks for prevention of preeclampsia later in pregnancy 300 tablet 2   . docusate sodium (COLACE) 100 MG capsule Take 1 capsule (100 mg total) by mouth 2 (two) times daily as needed for mild constipation or moderate constipation. 30 capsule 2   . famotidine (PEPCID) 20 MG tablet Take 1 tablet (20 mg total) by mouth daily. (Patient not taking: Reported on 03/03/2019) 30 tablet 1   . ferrous sulfate (FERROUSUL) 325 (65 FE) MG tablet Take 1 tablet (325 mg total) by mouth 2 (two) times daily. 60 tablet 1   . Prenatal Vit-Fe Fumarate-FA (PRENATAL  VITAMIN PO) Take by mouth.     . promethazine (PHENERGAN) 25 MG tablet Take 1 tablet (25 mg total) by mouth every 6 (six) hours as needed for nausea or vomiting. 30 tablet 2   . Vitamin D, Ergocalciferol, (DRISDOL) 50000 units CAPS capsule Take 1 capsule (50,000 Units total) by mouth every 7 (seven) days. 8 capsule 1      Review of Systems  All systems reviewed and negative except as stated in HPI  Physical Exam Blood pressure 112/76, pulse 77, temperature 98.2 F (36.8 C), temperature source Oral, resp. rate 18, height 5\' 5"  (1.651 m), weight 82.7 kg, last menstrual period 06/10/2018. General appearance: alert, oriented, NAD Lungs: normal respiratory effort Heart: regular rate Abdomen: soft, non-tender; gravid, FH appropriate for GA Extremities: No calf swelling or tenderness Presentation: cephalic Fetal monitoring:  Twin A Baseline 125, mod variability, + accles, no decels Twin B  Baseline 130, mod variability, +accels, no decels Toco:  Uterine activity: q4-6 min Dilation: 1.5 Effacement (%): 80 Station: -1, -2 Exam by:: Welford RocheJessica Thornton, RNC  Prenatal labs: ABO, Rh: --/--/O POS (08/18 0800) Antibody: NEG (08/18 0800) Rubella: 8.71 (02/04 1308) RPR: Non Reactive (06/10 0829)  HBsAg: Negative (02/04 1308)  HIV: Non Reactive (06/10 0829)  GC/Chlamydia: neg/neg 03/03/2019 GBS: Negative (08/05 0000)  2-hr GTT: normal 01/06/2019 Genetic screening:  NIPS low risk, dizygotic males Anatomy US:  Normal growth and BPP 03/03/2019 w/o major discordance Normal BPP 03/10/2019  Prenatal Transfer Tool  Maternal Diabetes: No Genetic Screening: Normal Maternal Ultrasounds/Referrals: Normal Fetal Ultrasounds or other Referrals:  None Maternal Substance Abuse:  No Significant Maternal Medications:  None Significant Maternal Lab Results: None  Results for orders placed or performed during the hospital encounter of 03/16/19 (from the past 24 hour(s))  Type and screen   Collection Time:  03/16/19  8:00 AM  Result Value Ref Range   ABO/RH(D) O POS    Antibody Screen NEG    Sample Expiration      03/19/2019,2359 Performed at [facility], [address] [place], [place] [address]   CBC   Collection Time: 03/16/19  8:05 AM  Result Value Ref Range   WBC 8.1 4.0 - 10.5 K/uL  RBC 3.70 (L) 3.87 - 5.11 MIL/uL   Hemoglobin 10.0 (L) 12.0 - 15.0 g/dL   HCT 30.6 (L) 36.0 - 46.0 %   MCV 82.7 80.0 - 100.0 fL   MCH 27.0 26.0 - 34.0 pg   MCHC 32.7 30.0 - 36.0 g/dL   RDW 15.7 (H) 11.5 - 15.5 %   Platelets 173 150 - 400 K/uL   nRBC 0.0 0.0 - 0.2 %    Patient Active Problem List   Diagnosis Date Noted  . Twin gestation in third trimester 03/16/2019  . Anemia in pregnancy 09/11/2018  . Supervision of high risk pregnancy, antepartum 09/01/2018  . History of cervical cone biopsy affecting care of mother in first trimester, antepartum 08/04/2018  . Dichorionic diamniotic twin gestation 08/04/2018    Assessment: Latoya Palmer is a 33 y.o. [id number] at [redacted]w[redacted]d here for IOL for di/di twin gestation.   #Di/di twin gestation: seen by Dr. Nehemiah Settle who confirmed vertex presentation of Twin A with breech presentation of Twin B, counseled on mode of delivery and possibility of breech extraction.  #Labor: Induce with pitocin, foley attempted w/o success #Pain: Planning for epidural #FWB: Cat I both Twin A and B #ID:  GBS neg, COVID neg 03/14/2019 #MOF: breast/bottle #MOC: declines #Circ:  To discuss  Clarnce Flock 03/16/2019, 12:27 PM

## 2019-03-16 NOTE — Progress Notes (Addendum)
Labor Progress Note LIZZETTE CARBONELL is a 33 y.o. [phone] at [redacted]w[redacted]d presented for IOL for twin delivery (vertex, breech)  S: Good pain control w/ epidural  O:  BP 118/73   Pulse 88   Temp 98.2 F (36.8 C) (Oral)   Resp 18   Ht 5\' 5"  (1.651 m)   Wt 82.7 kg   LMP 06/10/2018   SpO2 100%   BMI 30.34 kg/m  EFM A: 125/moderate var/accels, no decels EFM B: 135/moderate var/accels, no decels  CVE: Dilation: 4 Effacement (%): 70 Cervical Position: Anterior Station: -1 Presentation: Vertex Exam by:: Stinson   A&P: 33 y.o. [id number] [redacted]w[redacted]d here for IOL for twin delivery  #Labor: Progressing slowly on Pit. Multiple attempts to place foley bulb. Will continue Pit for now. AROM @ 9:15, clear fluid. #Pain: epidural placed #FWB: twin A: cat I. Twin B: cat I. #GBS negative #Anticipated MOD: SVD  [name], MD 9:18 PM

## 2019-03-16 NOTE — Progress Notes (Signed)
LABOR PROGRESS NOTE  Latoya Palmer is a 33 y.o. [id number] at [redacted]w[redacted]d  admitted for IOL for di/di twins.   Subjective: Excellent pain relief with epidural  Objective: BP 102/61   Pulse 80   Temp 98.2 F (36.8 C) (Oral)   Resp 16   Ht 5\' 5"  (1.651 m)   Wt 82.7 kg   LMP 06/10/2018   SpO2 100%   BMI 30.34 kg/m  or  Vitals:   03/16/19 1625 03/16/19 1630 03/16/19 1635 03/16/19 1640  BP: 117/86 125/76 121/64 102/61  Pulse: 79 73 82 80  Resp:      Temp:      TempSrc:      SpO2: 100% 100% 100% 100%  Weight:      Height:         Dilation: 1.5 Effacement (%): 80 Cervical Position: Anterior Station: -1 Presentation: Vertex Exam by:: Epifanio Lesches, RNC Twin A FHT: baseline rate 135, moderate varibility, +acel, no decel Twin B FHT: baseline rate 130, moderate varibility, +acel, no decel Toco: ctx q2-3 min  Labs: Lab Results  Component Value Date   WBC 8.1 03/16/2019   HGB 10.0 (L) 03/16/2019   HCT 30.6 (L) 03/16/2019   MCV 82.7 03/16/2019   PLT 173 03/16/2019    Patient Active Problem List   Diagnosis Date Noted  . Twin gestation in third trimester 03/16/2019  . Anemia in pregnancy 09/11/2018  . Supervision of high risk pregnancy, antepartum 09/01/2018  . History of cervical cone biopsy affecting care of mother in first trimester, antepartum 08/04/2018  . Dichorionic diamniotic twin gestation 08/04/2018    Assessment / Plan: 33 y.o. I4P3295 at [redacted]w[redacted]d here for IOL for di/di twins.  Labor: attempted foley placement again with speculum given fingertip dilation, unable to visualize and thread cook's. Cont pitocin, rupture once feasible Fetal Wellbeing:  Cat I both twins Pain Control:  Epidural, working well Anticipated MOD:  SVD  Clarnce Flock MD/[facility] OB Fellow  03/16/2019, 4:57 PM

## 2019-03-17 ENCOUNTER — Encounter (HOSPITAL_COMMUNITY)
Admission: AD | Disposition: A | Discharge status: Home / Self Care | Payer: Self-pay | Source: Home / Self Care | Attending: Obstetrics and Gynecology

## 2019-03-17 ENCOUNTER — Encounter (HOSPITAL_COMMUNITY): Payer: Self-pay | Admitting: Obstetrics

## 2019-03-17 DIAGNOSIS — O321XX2 Maternal care for breech presentation, fetus 2: Secondary | ICD-10-CM

## 2019-03-17 DIAGNOSIS — O30043 Twin pregnancy, dichorionic/diamniotic, third trimester: Secondary | ICD-10-CM

## 2019-03-17 DIAGNOSIS — Z3A38 38 weeks gestation of pregnancy: Secondary | ICD-10-CM

## 2019-03-17 SURGERY — Surgical Case
Anesthesia: Epidural | Site: Vagina | Wound class: Clean Contaminated

## 2019-03-17 MED ORDER — TRANEXAMIC ACID 1000 MG/10ML IV SOLN: 1.5000 mg/kg/h | INTRAVENOUS | Status: DC

## 2019-03-17 MED ORDER — BUPIVACAINE HCL (PF) 0.5 % IJ SOLN
INTRAMUSCULAR | Status: DC | PRN
  Administered 2019-03-17: 10 mL

## 2019-03-17 MED ORDER — LIDOCAINE HCL 1 % IJ SOLN
INTRAMUSCULAR | Status: AC
  Filled 2019-03-17: qty 20

## 2019-03-17 MED ORDER — ONDANSETRON HCL 4 MG/2ML IJ SOLN
4.0000 mg | INTRAMUSCULAR | Status: DC | PRN
  Administered 2019-03-17: 13:00:00 4 mg via INTRAVENOUS

## 2019-03-17 MED ORDER — IBUPROFEN 600 MG PO TABS
600.0000 mg | ORAL_TABLET | Freq: Four times a day (QID) | ORAL | Status: DC
  Administered 2019-03-17: 18:00:00 600 mg via ORAL
  Filled 2019-03-17 (×2): qty 1

## 2019-03-17 MED ORDER — SIMETHICONE 80 MG PO CHEW: 80.0000 mg | CHEWABLE_TABLET | ORAL | Status: DC | PRN

## 2019-03-17 MED ORDER — ONDANSETRON HCL 4 MG/2ML IJ SOLN
INTRAMUSCULAR | Status: AC
  Filled 2019-03-17: qty 2

## 2019-03-17 MED ORDER — SODIUM CHLORIDE 0.9 % IV SOLN
2.0000 g | Freq: Four times a day (QID) | INTRAVENOUS | Status: DC
  Filled 2019-03-17: qty 2000

## 2019-03-17 MED ORDER — ONDANSETRON HCL 4 MG PO TABS: 4.0000 mg | ORAL_TABLET | ORAL | Status: DC | PRN

## 2019-03-17 MED ORDER — MISOPROSTOL 200 MCG PO TABS
ORAL_TABLET | ORAL | Status: AC
  Filled 2019-03-17: qty 4

## 2019-03-17 MED ORDER — LIDOCAINE-EPINEPHRINE (PF) 2 %-1:200000 IJ SOLN
INTRAMUSCULAR | Status: AC
  Filled 2019-03-17: qty 10

## 2019-03-17 MED ORDER — SODIUM CHLORIDE 0.9 % IV SOLN
2.0000 g | Freq: Once | INTRAVENOUS | Status: AC
  Administered 2019-03-17: 15:00:00 2 g via INTRAVENOUS

## 2019-03-17 MED ORDER — BENZOCAINE-MENTHOL 20-0.5 % EX AERO
1.0000 "application " | INHALATION_SPRAY | CUTANEOUS | Status: DC | PRN
  Administered 2019-03-17: 1 via TOPICAL
  Filled 2019-03-17: qty 56

## 2019-03-17 MED ORDER — WITCH HAZEL-GLYCERIN EX PADS: 1.0000 | MEDICATED_PAD | CUTANEOUS | Status: DC | PRN

## 2019-03-17 MED ORDER — GENTAMICIN SULFATE 40 MG/ML IJ SOLN
5.0000 mg/kg | Freq: Once | INTRAVENOUS | Status: AC
  Administered 2019-03-17: 07:00:00 340 mg via INTRAVENOUS
  Filled 2019-03-17: qty 8.5

## 2019-03-17 MED ORDER — GENTAMICIN SULFATE 40 MG/ML IJ SOLN: 5.0000 mg/kg | INTRAVENOUS | Status: DC

## 2019-03-17 MED ORDER — MEASLES, MUMPS & RUBELLA VAC IJ SOLR: 0.5000 mL | Freq: Once | INTRAMUSCULAR | Status: DC

## 2019-03-17 MED ORDER — MISOPROSTOL 200 MCG PO TABS
800.0000 ug | ORAL_TABLET | Freq: Once | ORAL | Status: AC
  Administered 2019-03-17: 12:00:00 800 ug via ORAL

## 2019-03-17 MED ORDER — TRANEXAMIC ACID-NACL 1000-0.7 MG/100ML-% IV SOLN
INTRAVENOUS | Status: AC
  Filled 2019-03-17: qty 100

## 2019-03-17 MED ORDER — TETANUS-DIPHTH-ACELL PERTUSSIS 5-2.5-18.5 LF-MCG/0.5 IM SUSP: 0.5000 mL | Freq: Once | INTRAMUSCULAR | Status: DC

## 2019-03-17 MED ORDER — ACETAMINOPHEN 325 MG PO TABS
650.0000 mg | ORAL_TABLET | ORAL | Status: DC | PRN
  Administered 2019-03-17: 14:00:00 650 mg via ORAL
  Filled 2019-03-17: qty 2

## 2019-03-17 MED ORDER — PRENATAL MULTIVITAMIN CH
1.0000 | ORAL_TABLET | Freq: Every day | ORAL | Status: DC
  Filled 2019-03-17: qty 1

## 2019-03-17 MED ORDER — DIBUCAINE (PERIANAL) 1 % EX OINT: 1.0000 | TOPICAL_OINTMENT | CUTANEOUS | Status: DC | PRN

## 2019-03-17 MED ORDER — SENNOSIDES-DOCUSATE SODIUM 8.6-50 MG PO TABS: 2.0000 | ORAL_TABLET | ORAL | Status: DC

## 2019-03-17 MED ORDER — DIPHENHYDRAMINE HCL 25 MG PO CAPS: 25.0000 mg | ORAL_CAPSULE | Freq: Four times a day (QID) | ORAL | Status: DC | PRN

## 2019-03-17 MED ORDER — TRANEXAMIC ACID-NACL 1000-0.7 MG/100ML-% IV SOLN
1000.0000 mg | INTRAVENOUS | Status: AC
  Administered 2019-03-17: 13:00:00 1000 mg via INTRAVENOUS

## 2019-03-17 MED ORDER — METHYLERGONOVINE MALEATE 0.2 MG/ML IJ SOLN
INTRAMUSCULAR | Status: AC
  Filled 2019-03-17: qty 1

## 2019-03-17 MED ORDER — METHYLERGONOVINE MALEATE 0.2 MG/ML IJ SOLN
0.2000 mg | Freq: Once | INTRAMUSCULAR | Status: AC
  Administered 2019-03-17: 12:00:00 0.2 mg via INTRAMUSCULAR

## 2019-03-17 MED ORDER — COCONUT OIL OIL: 1.0000 "application " | TOPICAL_OIL | Status: DC | PRN

## 2019-03-17 MED ORDER — SODIUM CHLORIDE 0.9 % IV SOLN
2.0000 g | INTRAVENOUS | Status: DC
  Administered 2019-03-17 (×2): 2 g via INTRAVENOUS
  Filled 2019-03-17 (×2): qty 2000

## 2019-03-17 SURGICAL SUPPLY — 33 items
APL SKNCLS STERI-STRIP NONHPOA (GAUZE/BANDAGES/DRESSINGS) ×1
BENZOIN TINCTURE PRP APPL 2/3 (GAUZE/BANDAGES/DRESSINGS) ×2 IMPLANT
CHLORAPREP W/TINT 26ML (MISCELLANEOUS) ×2 IMPLANT
CLAMP CORD UMBIL (MISCELLANEOUS) IMPLANT
CLOTH BEACON ORANGE TIMEOUT ST (SAFETY) ×2 IMPLANT
DRSG OPSITE POSTOP 4X10 (GAUZE/BANDAGES/DRESSINGS) ×2 IMPLANT
ELECT REM PT RETURN 9FT ADLT (ELECTROSURGICAL) ×2
ELECTRODE REM PT RTRN 9FT ADLT (ELECTROSURGICAL) ×1 IMPLANT
EXTRACTOR VACUUM M CUP 4 TUBE (SUCTIONS) IMPLANT
GLOVE BIOGEL PI IND STRL 7.0 (GLOVE) ×2 IMPLANT
GLOVE BIOGEL PI IND STRL 7.5 (GLOVE) ×2 IMPLANT
GLOVE BIOGEL PI INDICATOR 7.0 (GLOVE) ×2
GLOVE BIOGEL PI INDICATOR 7.5 (GLOVE) ×2
GLOVE ECLIPSE 7.5 STRL STRAW (GLOVE) ×2 IMPLANT
GOWN STRL REUS W/TWL LRG LVL3 (GOWN DISPOSABLE) ×6 IMPLANT
KIT ABG SYR 3ML LUER SLIP (SYRINGE) IMPLANT
NDL HYPO 25X5/8 SAFETYGLIDE (NEEDLE) IMPLANT
NEEDLE HYPO 25X5/8 SAFETYGLIDE (NEEDLE) IMPLANT
NS IRRIG 1000ML POUR BTL (IV SOLUTION) ×2 IMPLANT
PACK C SECTION WH (CUSTOM PROCEDURE TRAY) ×2 IMPLANT
PAD OB MATERNITY 4.3X12.25 (PERSONAL CARE ITEMS) ×2 IMPLANT
PENCIL SMOKE EVAC W/HOLSTER (ELECTROSURGICAL) ×2 IMPLANT
RTRCTR C-SECT PINK 25CM LRG (MISCELLANEOUS) ×2 IMPLANT
STRIP CLOSURE SKIN 1/2X4 (GAUZE/BANDAGES/DRESSINGS) ×2 IMPLANT
SUT VIC AB 0 CT1 36 (SUTURE) ×2 IMPLANT
SUT VIC AB 0 CTX 36 (SUTURE) ×4
SUT VIC AB 0 CTX36XBRD ANBCTRL (SUTURE) ×2 IMPLANT
SUT VIC AB 2-0 CT1 27 (SUTURE) ×2
SUT VIC AB 2-0 CT1 TAPERPNT 27 (SUTURE) ×1 IMPLANT
SUT VIC AB 4-0 KS 27 (SUTURE) ×2 IMPLANT
TOWEL OR 17X24 6PK STRL BLUE (TOWEL DISPOSABLE) ×2 IMPLANT
TRAY FOLEY W/BAG SLVR 14FR LF (SET/KITS/TRAYS/PACK) ×2 IMPLANT
WATER STERILE IRR 1000ML POUR (IV SOLUTION) ×2 IMPLANT

## 2019-03-17 NOTE — Lactation Note (Addendum)
This note was copied from a baby's chart. Lactation Consultation Note  Patient Name: Latoya Palmer LDJTT'S Date: 03/17/2019 Reason for consult: Initial assessment;Term   Twins [redacted]w[redacted]d.  5 hours old.  Mother states she would like to breast and formula feed. She breastfed her first child for 6 weeks and stopped due to heart medication. She states should would like to do the same with the twins. Mother breastfed both twins at the same time with last feeding and denies problems.   Baby B Zane cueing upon entering.   Mother hand expressed drops and then with [facility] assistance latched baby in cradle hold.  Colletta Maryland RN entered room during consult and charted feeding. Intermittent swallows observed.  Reviewed volume guidelines for formula encouraging mother to breastfeed before offering formula. Discussed basics.  Feed on demand approximately 8-12 times per day.   Mother states she does not want to pump at this time.  Mom made aware of O/P services, breastfeeding support groups, community resources, and our phone # for post-discharge questions.       Maternal Data Has patient been taught Hand Expression?: Yes Does the patient have breastfeeding experience prior to this delivery?: Yes  Feeding Feeding Type: Breast Fed  LATCH Score Latch: Grasps breast easily, tongue down, lips flanged, rhythmical sucking.  Audible Swallowing: A few with stimulation  Type of Nipple: Everted at rest and after stimulation  Comfort (Breast/Nipple): Soft / non-tender  Hold (Positioning): Assistance needed to correctly position infant at breast and maintain latch.  LATCH Score: 8  Interventions Interventions: Breast feeding basics reviewed;Assisted with latch;Skin to skin  Lactation Tools Discussed/Used     Consult Status Consult Status: Follow-up Date: 03/18/19 Follow-up type: In-patient    Vivianne Master [facility] 03/17/2019, 5:41 PM

## 2019-03-17 NOTE — Progress Notes (Addendum)
Labor Progress Note Latoya Palmer is a 33 y.o. [phone] at [redacted]w[redacted]d presented for IOL for twin delivery.  S: continued discomfort despite new epidural placement.    O:  BP (!) 139/127   Pulse (!) 113   Temp 99.9 F (37.7 C) (Oral)   Resp 18   Ht 5\' 5"  (1.651 m)   Wt 82.7 kg   LMP 06/10/2018   SpO2 100%   BMI 30.34 kg/m  EFM A: 160/moderate/accel (for the past 10-20 minutes) EFM B: 180/moderate var/accels (for the past 10-20 minutes)  CVE: Dilation: 6 Effacement (%): 80 Cervical Position: Anterior Station: -1 Presentation: Vertex Exam by:: [name], CNM   A&P: 33 y.o. [phone] [redacted]w[redacted]d here for IOL for di-di twin delivery (vertex, breech)  #Labor: Pit washout for one hour before resumption of pit gtt. UIPC to assess contractions. FSE placed for consistent monitoring of twin A. #Pain: 2nd epidural placed without significant relief. IV fentanyl for pain. #FWB: twin A: category II; twin B: category II #GBS negative  #Triple I: temp 99.9. fetal heart rates elevated to 180-190s. Starting amp + gent. Tylenol for temperature #Anticipated MOD: vaginal delivery  [name], MD 5:02 AM

## 2019-03-17 NOTE — Transfer of Care (Signed)
Immediate Anesthesia Transfer of Care Note  Patient: Latoya Palmer  Procedure(s) Performed: CESAREAN SECTION (N/A )  Patient Location: PACU and OR A  Anesthesia Type:Epidural  Level of Consciousness: awake, alert  and patient cooperative  Airway & Oxygen Therapy: Patient Spontanous Breathing  Post-op Assessment: Report given to RN and Post -op Vital signs reviewed and stable  Post vital signs: Reviewed and stable  Last Vitals:  Vitals Value Taken Time  BP    Temp    Pulse    Resp    SpO2      Last Pain:  Vitals:   03/17/19 1043  TempSrc:   PainSc: Asleep         Complications: No apparent anesthesia complications

## 2019-03-17 NOTE — Progress Notes (Signed)
Patient ID: Latoya Palmer, female   DOB: 31-Dec-1985, 33 y.o.   MRN: [id number]  Called to patient's room. Patient frustrated because epidural isn't working well anymore, despite it being bolused and redosed. The anesthesiologist has offered to place a new epidural. Before proceeding, she wanted it documented that she and her nurse had both asked earlier in the day to have her water broke and she was feeling ignored. She feels that she might be further along in the labor process if this had been done earlier. We discussed that the act of breaking her water may not have changed anything if it had been done earlier in the day as she had a fair amount of scar tissue that I broke up when I checked her at 9pm. Over all, fetal tracing reassuring with accelerations.  Truett Mainland, DO 03/17/2019 3:41 AM

## 2019-03-17 NOTE — Discharge Summary (Signed)
Postpartum Discharge Summary     Patient Name: Latoya Palmer DOB: 12-18-1985 MRN: [id number]  Date of admission: 03/16/2019 Delivering Provider:    Gustavus BryantHairston, BoyA Edye [[id number]][[id number]]  Gwinda PasseFAIR, CHELSEA N    Goetzinger, BoyB Cadie [[id number]][[id number]]  Jerilynn BirkenheadFAIR, CHELSEA N    Date of discharge: 03/18/2019  Admitting diagnosis: INDUCTION Intrauterine pregnancy: 6272w1d     Secondary diagnosis:  Active Problems:   History of cervical cone biopsy affecting care of mother in first trimester, antepartum   Dichorionic diamniotic twin gestation   Anemia in pregnancy   Twin gestation in third trimester  Additional problems: None     Discharge diagnosis: Term Pregnancy Delivered of Di/Di twins                                                                                            Post partum procedures: None  Augmentation: AROM and Pitocin  Complications: PPH > 500 mL  Hospital course:  Induction of Labor With Vaginal Delivery   33 y.o. yo [phone] at 6272w1d was admitted to the hospital 03/16/2019 for induction of labor.  Indication for induction: multifetal gestation.  Patient had an uncomplicated labor course as follows. Patient presented to L&D for  IOL secondary to multi-fetal gestation. Initial SVE: 1.5/80%/-1. Induction with AROM and Oxytocin. Epidural placed. Due to maternal fever and fetal tachycardia, patient placed on Ampicillin and Gentamicin. She then progressed to complete and felt the urge to push and was transferred to OR for delivery. Prior to delivery, Twin A vertex and Twin B breech presentation. External cephalic version of Twin B successfully rotated to vertex position prior to delivery and therefore both twins delivered in breech presentation. PPH > 500 mL after delivery.  Membrane Rupture Time/Date:    Gustavus BryantHairston, BoyA Donnice [[id number]][[id number]]  9:15 PM    Sondra ComeHairston, BoyB Craig [[id number]][[id number]]  11:52 AM  ,   Gustavus BryantHairston, BoyA Peytin [[id number]][[id number]]  03/16/2019    Sondra ComeHairston, BoyB Vernon  [[id number]][[id number]]  03/17/2019    Intrapartum Procedures: Episiotomy:    Gustavus BryantHairston, BoyA Geanine [[id number]][[id number]]  None [1]    Sondra ComeHairston, BoyB Lincy [[id number]][[id number]]  None [1]                                          Lacerations:     Gustavus BryantHairston, BoyA Miryah [[id number]][[id number]]  2nd degree [3]    Sondra ComeHairston, BoyB Makya [[id number]][[id number]]  2nd degree [3]   Patient had delivery of 2 Viable infants.  Information for the patient's newborn:  Gustavus BryantHairston, BoyA Fredrica [[id number]][[id number]]  Delivery Method: Vaginal, Spontaneous(Filed from Delivery Summary)  Information for the patient's newborn:  Sondra ComeHairston, BoyB Eurydice [[id number]][[id number]]  Delivery Method: Vaginal, Spontaneous(Filed from Delivery Summary)      Gustavus BryantHairston, BoyA Tory [[id number]][[id number]]  03/17/2019    Sondra ComeHairston, BoyB Rilei [[id number]][[id number]]  03/17/2019   Details of delivery can be found in separate delivery note.  Patient had a routine postpartum course. Patient is discharged home 03/18/19.  Magnesium Sulfate recieved: No BMZ received: No  Physical exam  Vitals:  03/17/19 1620 03/17/19 2007 03/18/19 0030 03/18/19 0547  BP: (!) 118/59 117/72 (!) 98/58 99/67  Pulse: 100 86 80 74  Resp: 18 18 16 18   Temp: 99.5 F (37.5 C) 98.1 F (36.7 C) 97.9 F (36.6 C)   TempSrc: Oral Oral Oral Oral  SpO2: 99% 100% 99% 100%  Weight:      Height:       General: alert, cooperative and no distress Lochia: appropriate Uterine Fundus: firm Incision: N/A DVT Evaluation: No evidence of DVT seen on physical exam. No cords or calf tenderness. No significant calf/ankle edema. Labs: Lab Results  Component Value Date   WBC 8.1 03/16/2019   HGB 10.0 (L) 03/16/2019   HCT 30.6 (L) 03/16/2019   MCV 82.7 03/16/2019   PLT 173 03/16/2019   CMP Latest Ref Rng & Units 03/16/2019  Glucose 70 - 99 mg/dL 73  BUN 6 - 20 mg/dL <5(L)  Creatinine 0.44 - 1.00 mg/dL 0.81  Sodium 135 - 145 mmol/L 136  Potassium 3.5 - 5.1 mmol/L 3.8  Chloride 98 - 111 mmol/L 108  CO2 22 - 32 mmol/L 19(L)  Calcium 8.9 - 10.3  mg/dL 8.5(L)  Total Protein 6.5 - 8.1 g/dL 5.4(L)  Total Bilirubin 0.3 - 1.2 mg/dL 0.6  Alkaline Phos 38 - 126 U/L 795(H)  AST 15 - 41 U/L 20  ALT 0 - 44 U/L 11    Discharge instruction: per After Visit Summary and "Baby and Me Booklet".  After visit meds:  Allergies as of 03/18/2019      Reactions   Latex Swelling, Other (See Comments)   Causes irritation      Medication List    STOP taking these medications   aspirin EC 81 MG tablet   promethazine 25 MG tablet Commonly known as: PHENERGAN     TAKE these medications   calcium carbonate 500 MG chewable tablet Commonly known as: TUMS - dosed in mg elemental calcium Chew 1 tablet by mouth 3 (three) times daily as needed for indigestion or heartburn.   ferrous sulfate 325 (65 FE) MG tablet Commonly known as: FerrouSul Take 1 tablet (325 mg total) by mouth 2 (two) times daily. What changed: when to take this   ibuprofen 600 MG tablet Commonly known as: ADVIL Take 1 tablet (600 mg total) by mouth every 6 (six) hours.   prenatal multivitamin Tabs tablet Take 1 tablet by mouth daily at 12 noon.   Vitamin D (Ergocalciferol) 1.25 MG (50000 UT) Caps capsule Commonly known as: DRISDOL Take 1 capsule (50,000 Units total) by mouth every 7 (seven) days. What changed: additional instructions       Diet: routine diet  Activity: Advance as tolerated. Pelvic rest for 6 weeks.   Outpatient follow up:4 weeks Follow up Appt: Future Appointments  Date Time Provider [place]  04/19/2019 10:35 AM Tresea Mall, CNM [facility]   Follow up Visit:   Please schedule this patient for Postpartum visit in: 4 weeks with the following provider: Any provider High risk pregnancy complicated by: multi-gestation Delivery mode:  SVD Anticipated Birth Control:  other/unsure - declined PP Procedures needed: None  Schedule Integrated BH visit: no      Newborn Data:   Anushka, Hartinger [[id number]]  Live born female   Birth Weight:   APGAR: 54, 9  Newborn Delivery   Birth date/time: 03/17/2019 11:36:00 Delivery type: Vaginal, Spontaneous       Neima, Lacross [[id number]]  Live born female  Birth  Weight:   APGAR: 8, 9  Newborn Delivery   Birth date/time: 03/17/2019 12:14:00 Delivery type: Vaginal, Spontaneous      Baby Feeding: Breast Disposition:home with mother   03/18/2019 Vonzella NippleJulie Saarah Dewing, PA-C

## 2019-03-17 NOTE — Progress Notes (Signed)
Labor Progress Note Latoya Palmer is a 33 y.o. [phone] at [redacted]w[redacted]d presented for IOL for twin delivery.  S: Significant discomfort with contractions despite epidural.    O:  BP 133/87   Pulse (!) 114   Temp 99.7 F (37.6 C) (Axillary) Comment (Src): patient eating Ice  Resp 18   Ht 5\' 5"  (1.651 m)   Wt 82.7 kg   LMP 06/10/2018   SpO2 98%   BMI 30.34 kg/m  EFM A: 145/moderate/accel EFM B: 140/moderate var/accels  CVE: Dilation: 6 Effacement (%): 80 Cervical Position: Anterior Station: -1 Presentation: Vertex Exam by:: [name], CNM   A&P: 33 y.o. [phone] [redacted]w[redacted]d here for IOL for di-di twin delivery (vertex, breech) #Labor: Progressing well on pit. Continue pit. #Pain: epidural in place. Anesthesia contacted for reassessment of epidural #FWB: twin A: category I; twin B: category I #GBS negative #Anticipated MOD: vaginal delivery  Latoya Haymaker, MD 2:45 AM

## 2019-03-17 NOTE — Progress Notes (Signed)
ANTIBIOTIC CONSULT NOTE - INITIAL  Pharmacy Consult for Gentamicin Indication: Chorioamnionitis  Allergies  Allergen Reactions  . Latex Swelling and Other (See Comments)    Causes irritation    Patient Measurements: Height: 5\' 5"  (165.1 cm) Weight: 182 lb 4.8 oz (82.7 kg) IBW/kg (Calculated) : 57 Adjusted Body Weight: 67.3 kg  Vital Signs: Temp: 101 F (38.3 C) (08/19 0500) Temp Source: Oral (08/19 0500) BP: 134/87 (08/19 0500) Pulse Rate: 137 (08/19 0500)  Labs: Recent Labs    03/16/19 0805 03/16/19 1217  WBC 8.1  --   HGB 10.0*  --   PLT 173  --   CREATININE  --  0.81   No results for input(s): GENTTROUGH, GENTPEAK, GENTRANDOM in the last 72 hours.   Microbiology: Recent Results (from the past 720 hour(s))  OB RESULT CONSOLE Group B Strep     Status: None   Collection Time: 03/03/19 12:00 AM  Result Value Ref Range Status   GBS Negative  Final  Culture, beta strep (group b only)     Status: None   Collection Time: 03/03/19  4:41 PM   Specimen: Vaginal/Rectal; Genital   VR  Result Value Ref Range Status   Strep Gp B Culture Negative Negative Final    Comment: Centers for Disease Control and Prevention (CDC) and American Congress of Obstetricians and Gynecologists (ACOG) guidelines for prevention of perinatal group B streptococcal (GBS) disease specify co-collection of a vaginal and rectal swab specimen to maximize sensitivity of GBS detection. Per the CDC and ACOG, swabbing both the lower vagina and rectum substantially increases the yield of detection compared with sampling the vagina alone. Penicillin G, ampicillin, or cefazolin are indicated for intrapartum prophylaxis of perinatal GBS colonization. Reflex susceptibility testing should be performed prior to use of clindamycin only on GBS isolates from penicillin-allergic women who are considered a high risk for anaphylaxis. Treatment with vancomycin without additional testing is warranted if resistance  to clindamycin is noted.   SARS CORONAVIRUS 2 Nasal Swab Aptima Multi Swab     Status: None   Collection Time: 03/14/19  8:04 AM   Specimen: Aptima Multi Swab; Nasal Swab  Result Value Ref Range Status   SARS Coronavirus 2 NEGATIVE NEGATIVE Final    Comment: (NOTE) SARS-CoV-2 target nucleic acids are NOT DETECTED. The SARS-CoV-2 RNA is generally detectable in upper and lower respiratory specimens during the acute phase of infection. Negative results do not preclude SARS-CoV-2 infection, do not rule out co-infections with other pathogens, and should not be used as the sole basis for treatment or other patient management decisions. Negative results must be combined with clinical observations, patient history, and epidemiological information. The expected result is Negative. Fact Sheet for Patients: SugarRoll.be Fact Sheet for Healthcare Providers: https://www.woods-mathews.com/ This test is not yet approved or cleared by the [place] FDA and  has been authorized for detection and/or diagnosis of SARS-CoV-2 by FDA under an Emergency Use Authorization (EUA). This EUA will remain  in effect (meaning this test can be used) for the duration of the COVID-19 declaration under Section 56 4(b)(1) of the Act, 21 U.S.C. section 360bbb-3(b)(1), unless the authorization is terminated or revoked sooner. Performed at [facility], [place] [address], [place], [place] [address]      Assessment: 33 y.o. female [phone] at [redacted]w[redacted]d  Pharmacy consulted to start gentamicin for chorioamnionitis  Goal of Therapy:  Gentamicin peak 6-8 mg/L and Trough < 1 mg/L  Plan:  Gentamicin 340 mg (5 mg/kg)  IV x 1  Check Scr with next labs if gentamicin continued. Will check gentamicin levels if continued > 72hr or clinically indicated.  Thank you for allowing us to participate in this patients care.  Signe Coltonya C Naithan Delage, PharmD 03/17/2019,5:35 AM

## 2019-03-18 MED ORDER — IBUPROFEN 600 MG PO TABS: 600.0000 mg | ORAL_TABLET | Freq: Four times a day (QID) | ORAL | 0 refills | Status: DC

## 2019-03-18 NOTE — Discharge Instructions (Signed)
Breastfeeding Twins or Multiples Choosing to breastfeed your babies has many benefits:  It helps your uterus return to its original size faster.  It releases hormones that relax you.  It saves money and time. Your milk is available at the right temperature and whenever your babies are ready to feed.  It ensures your babies get the best nutrition.  It creates a unique bond between you and each of your babies. Breast milk is beneficial for all babies, but it is especially beneficial to multiples, who are often small at birth and need all the advantages breast milk can provide. Mothers of multiples typically produce enough milk for all their babies. When should I start breastfeeding? Nurse as soon as possible and as often as your babies want to be nursed. This will cause your body to produce enough milk for all your babies. Work with a Advertising copywriterlactation consultant as soon as possible. It is important to assess each infant at the breast to make sure they can latch and feed. If your babies were born prematurely and are unable to nurse, you can pump your breasts and freeze the milk until your babies are ready to feed at the breast. To make sure that your body makes enough milk, empty your breast at least 8-10 times in a 24-hour period. Ask a lactation specialist to help you choose an effective breast pump and for guidance in helping your babies latch on to and feed from the breast when they are ready. Should I nurse my babies together?  It is up to you whether you nurse your babies together or separately. However, many mothers of multiples find that it is easier and time-saving to nurse two babies at the same time. Nursing babies together may also help establish your milk supply. Nursing two babies at the same time can be tricky at first, but it often gets easier as the babies get older and more experienced at latching on to the breast. When nursing your babies together:  Ask your nurse or lactation  specialist to suggest tips on positioning. There are several positions and holds that make it easier to nurse more than one baby at a time.  Try placing pillows under your arms and legs and under your babies for comfort. Use a nursing pillow specially designed for multiples.  Switch your babies from one side to the other at alternate feedings. For example, if baby A feeds from the right breast and baby B feeds from the left breast, then at the next feeding baby A should take the left breast and baby B the right breast. This ensures that both breasts get equal amounts of stimulation. It also allows the stronger sucking baby to increase the milk supply for the baby whose suck is weaker. What are some tips to increase my success?  A good latch helps the babies empty the breasts. It also prevents sore nipples. If you are nursing babies together and one of the babies is having difficulty latching or sucking, try nursing that baby separately. That way you can give her or him your full attention.  If you are nursing babies separately and one of the babies is having difficulty feeding, it may help to nurse that baby and his or her sibling together. The baby with the stronger or more effective suck will stimulate the mothers milk to flow faster.  Try not to give your babies bottles and pacifiers during the early weeks of breastfeeding. Avoiding these encourages effective sucking patterns and helps  establish a good milk supply. You should not need supplemental feedings if you empty your breasts with each feeding.  Keep track of each babys stools and wet diapers for the first 6 weeks to make sure each baby is getting enough milk. Signs that your babies are getting enough milk include: ? Wetting at least 1-2 diapers in the first 24 hours after birth. ? Wetting at least 5-6 diapers every 24 hours for the first week after birth. The urine should be clear and pale yellow by 5 days after birth. ? Wetting 6-8  diapers every 24 hours as your babies grow and develop. ? Producing a healthy amount of stool:  By the time your babies are 9 days old, they should produce at least 3 stools in a 24-hour period. The stools should be soft and yellow.  By the time your babies are 42 days old, they should produce at least 3 stools in a 24-hour period. The stools should be seedy and yellow. ? Gaining a healthy amount of weight. Talk to your health care provider about how much weight your babies should be gaining.  If your babies do not get enough milk from breastfeeding alone, talk with a lactation specialist. It may be possible to breastfeed part of the time and supplement feedings with donated milk or formula.  Drink plenty of fluids so your urine is clear or pale yellow.  Eat a healthy diet that includes fresh fruits and vegetables, whole grains, lean meat, fish, eggs, beans, nuts, and seeds, and low-fat dairy products. Summary  Breastfeeding is beneficial for twins, multiples, and their mothers.  Nurse your babies as soon as possible after birth. You may nurse two babies at one time.  Work with a lactation specialist to assess your babies' latches, find positioning and breastfeeding strategies that work for you, and overcome any nursing challenges. This information is not intended to replace advice given to you by your health care provider. Make sure you discuss any questions you have with your health care provider. Document Released: 11/12/2004 Document Revised: 06/27/2017 Document Reviewed: 07/17/2016 Elsevier Patient Education  2020 [place]. Postpartum Care After Vaginal Delivery This sheet gives you information about how to care for yourself from the time you deliver your baby to up to 6-12 weeks after delivery (postpartum period). Your health care provider may also give you more specific instructions. If you have problems or questions, contact your health care provider. Follow these instructions at  home: Vaginal bleeding  It is normal to have vaginal bleeding (lochia) after delivery. Wear a sanitary pad for vaginal bleeding and discharge. ? During the first week after delivery, the amount and appearance of lochia is often similar to a menstrual period. ? Over the next few weeks, it will gradually decrease to a dry, yellow-brown discharge. ? For most women, lochia stops completely by 4-6 weeks after delivery. Vaginal bleeding can vary from woman to woman.  Change your sanitary pads frequently. Watch for any changes in your flow, such as: ? A sudden increase in volume. ? A change in color. ? Large blood clots.  If you pass a blood clot from your vagina, save it and call your health care provider to discuss. Do not flush blood clots down the toilet before talking with your health care provider.  Do not use tampons or douches until your health care provider says this is safe.  If you are not breastfeeding, your period should return 6-8 weeks after delivery. If you are feeding  your child breast milk only (exclusive breastfeeding), your period may not return until you stop breastfeeding. Perineal care  Keep the area between the vagina and the anus (perineum) clean and dry as told by your health care provider. Use medicated pads and pain-relieving sprays and creams as directed.  If you had a cut in the perineum (episiotomy) or a tear in the vagina, check the area for signs of infection until you are healed. Check for: ? More redness, swelling, or pain. ? Fluid or blood coming from the cut or tear. ? Warmth. ? Pus or a bad smell.  You may be given a squirt bottle to use instead of wiping to clean the perineum area after you go to the bathroom. As you start healing, you may use the squirt bottle before wiping yourself. Make sure to wipe gently.  To relieve pain caused by an episiotomy, a tear in the vagina, or swollen veins in the anus (hemorrhoids), try taking a warm sitz bath 2-3 times a  day. A sitz bath is a warm water bath that is taken while you are sitting down. The water should only come up to your hips and should cover your buttocks. Breast care  Within the first few days after delivery, your breasts may feel heavy, full, and uncomfortable (breast engorgement). Milk may also leak from your breasts. Your health care provider can suggest ways to help relieve the discomfort. Breast engorgement should go away within a few days.  If you are breastfeeding: ? Wear a bra that supports your breasts and fits you well. ? Keep your nipples clean and dry. Apply creams and ointments as told by your health care provider. ? You may need to use breast pads to absorb milk that leaks from your breasts. ? You may have uterine contractions every time you breastfeed for up to several weeks after delivery. Uterine contractions help your uterus return to its normal size. ? If you have any problems with breastfeeding, work with your health care provider or Advertising copywriterlactation consultant.  If you are not breastfeeding: ? Avoid touching your breasts a lot. Doing this can make your breasts produce more milk. ? Wear a good-fitting bra and use cold packs to help with swelling. ? Do not squeeze out (express) milk. This causes you to make more milk. Intimacy and sexuality  Ask your health care provider when you can engage in sexual activity. This may depend on: ? Your risk of infection. ? How fast you are healing. ? Your comfort and desire to engage in sexual activity.  You are able to get pregnant after delivery, even if you have not had your period. If desired, talk with your health care provider about methods of birth control (contraception). Medicines  Take over-the-counter and prescription medicines only as told by your health care provider.  If you were prescribed an antibiotic medicine, take it as told by your health care provider. Do not stop taking the antibiotic even if you start to feel  better. Activity  Gradually return to your normal activities as told by your health care provider. Ask your health care provider what activities are safe for you.  Rest as much as possible. Try to rest or take a nap while your baby is sleeping. Eating and drinking   Drink enough fluid to keep your urine pale yellow.  Eat high-fiber foods every day. These may help prevent or relieve constipation. High-fiber foods include: ? Whole grain cereals and breads. ? Brown rice. ? Beans. ?  Fresh fruits and vegetables.  Do not try to lose weight quickly by cutting back on calories.  Take your prenatal vitamins until your postpartum checkup or until your health care provider tells you it is okay to stop. Lifestyle  Do not use any products that contain nicotine or tobacco, such as cigarettes and e-cigarettes. If you need help quitting, ask your health care provider.  Do not drink alcohol, especially if you are breastfeeding. General instructions  Keep all follow-up visits for you and your baby as told by your health care provider. Most women visit their health care provider for a postpartum checkup within the first 3-6 weeks after delivery. Contact a health care provider if:  You feel unable to cope with the changes that your child brings to your life, and these feelings do not go away.  You feel unusually sad or worried.  Your breasts become red, painful, or hard.  You have a fever.  You have trouble holding urine or keeping urine from leaking.  You have little or no interest in activities you used to enjoy.  You have not breastfed at all and you have not had a menstrual period for 12 weeks after delivery.  You have stopped breastfeeding and you have not had a menstrual period for 12 weeks after you stopped breastfeeding.  You have questions about caring for yourself or your baby.  You pass a blood clot from your vagina. Get help right away if:  You have chest pain.  You have  difficulty breathing.  You have sudden, severe leg pain.  You have severe pain or cramping in your lower abdomen.  You bleed from your vagina so much that you fill more than one sanitary pad in one hour. Bleeding should not be heavier than your heaviest period.  You develop a severe headache.  You faint.  You have blurred vision or spots in your vision.  You have bad-smelling vaginal discharge.  You have thoughts about hurting yourself or your baby. If you ever feel like you may hurt yourself or others, or have thoughts about taking your own life, get help right away. You can go to the nearest emergency department or call:  Your local emergency services (911 in the [place]  A suicide crisis helpline, such as the National Suicide Prevention Lifeline at 647-741-87021-332-301-8807. This is open 24 hours a day. Summary  The period of time right after you deliver your newborn up to 6-12 weeks after delivery is called the postpartum period.  Gradually return to your normal activities as told by your health care provider.  Keep all follow-up visits for you and your baby as told by your health care provider. This information is not intended to replace advice given to you by your health care provider. Make sure you discuss any questions you have with your health care provider. Document Released: 05/12/2007 Document Revised: 07/18/2017 Document Reviewed: 04/28/2017 Elsevier Patient Education  2020 [employer]

## 2019-03-18 NOTE — Anesthesia Postprocedure Evaluation (Signed)
Anesthesia Post Note  Patient: ABRISH ERNY  Procedure(s) Performed: VAGINAL DELIVERY OF TWINS IN OPERATING ROOM (N/A Vagina )     Patient location during evaluation: Mother Baby Anesthesia Type: Epidural Level of consciousness: awake Pain management: satisfactory to patient Vital Signs Assessment: post-procedure vital signs reviewed and stable Respiratory status: spontaneous breathing Cardiovascular status: stable Anesthetic complications: no    Last Vitals:  Vitals:   03/18/19 0030 03/18/19 0547  BP: (!) 98/58 99/67  Pulse: 80 74  Resp: 16 18  Temp: 36.6 C   SpO2: 99% 100%    Last Pain:  Vitals:   03/18/19 0547  TempSrc: Oral  PainSc:    Pain Goal:                   Casimer Lanius

## 2019-04-19 ENCOUNTER — Telehealth: Payer: Medicaid Other | Admitting: Advanced Practice Midwife

## 2019-04-19 ENCOUNTER — Encounter: Payer: Self-pay | Admitting: Student

## 2019-04-20 ENCOUNTER — Other Ambulatory Visit: Payer: Self-pay

## 2019-04-20 ENCOUNTER — Telehealth (INDEPENDENT_AMBULATORY_CARE_PROVIDER_SITE_OTHER): Payer: Medicaid Other | Admitting: Student

## 2019-04-20 DIAGNOSIS — F53 Postpartum depression: Secondary | ICD-10-CM

## 2019-04-20 DIAGNOSIS — O99345 Other mental disorders complicating the puerperium: Secondary | ICD-10-CM

## 2019-04-20 NOTE — Patient Instructions (Addendum)
Constipation, Adult Constipation is when a person has fewer bowel movements in a week than normal, has difficulty having a bowel movement, or has stools that are dry, hard, or larger than normal. Constipation may be caused by an underlying condition. It may become worse with age if a person takes certain medicines and does not take in enough fluids. Follow these instructions at home: Eating and drinking   Eat foods that have a lot of fiber, such as fresh fruits and vegetables, whole grains, and beans.  Limit foods that are high in fat, low in fiber, or overly processed, such as french fries, hamburgers, cookies, candies, and soda.  Drink enough fluid to keep your urine clear or pale yellow. General instructions  Exercise regularly or as told by your health care provider.  Go to the restroom when you have the urge to go. Do not hold it in.  Take over-the-counter and prescription medicines only as told by your health care provider. These include any fiber supplements.  Practice pelvic floor retraining exercises, such as deep breathing while relaxing the lower abdomen and pelvic floor relaxation during bowel movements.  Watch your condition for any changes.  Keep all follow-up visits as told by your health care provider. This is important. Contact a health care provider if:  You have pain that gets worse.  You have a fever.  You do not have a bowel movement after 4 days.  You vomit.  You are not hungry.  You lose weight.  You are bleeding from the anus.  You have thin, pencil-like stools. Get help right away if:  You have a fever and your symptoms suddenly get worse.  You leak stool or have blood in your stool.  Your abdomen is bloated.  You have severe pain in your abdomen.  You feel dizzy or you faint. This information is not intended to replace advice given to you by your health care provider. Make sure you discuss any questions you have with your health care  provider. Document Released: 04/12/2004 Document Revised: 06/27/2017 Document Reviewed: 01/03/2016 Elsevier Patient Education  2020 [employer].     Postpartum Baby Blues The postpartum period begins right after the birth of a baby. During this time, there is often a lot of joy and excitement. It is also a time of many changes in the life of the parents. No matter how many times a mother gives birth, each child brings new challenges to the family, including different ways of relating to one another. It is common to have feelings of excitement along with confusing changes in moods, emotions, and thoughts. You may feel happy one minute and sad or stressed the next. These feelings of sadness usually happen in the period right after you have your baby, and they go away within a week or two. This is called the "baby blues." What are the causes? There is no known cause of baby blues. It is likely caused by a combination of factors. However, changes in hormone levels after childbirth are believed to trigger some of the symptoms. Other factors that can play a role in these mood changes include:  Lack of sleep.  Stressful life events, such as poverty, caring for a loved one, or death of a loved one.  Genetics. What are the signs or symptoms? Symptoms of this condition include:  Brief changes in mood, such as going from extreme happiness to sadness.  Decreased concentration.  Difficulty sleeping.  Crying spells and tearfulness.  Loss of  appetite.  Irritability.  Anxiety. If the symptoms of baby blues last for more than 2 weeks or become more severe, you may have postpartum depression. How is this diagnosed? This condition is diagnosed based on an evaluation of your symptoms. There are no medical or lab tests that lead to a diagnosis, but there are various questionnaires that a health care provider may use to identify women with the baby blues or postpartum depression. How is this  treated? Treatment is not needed for this condition. The baby blues usually go away on their own in 1-2 weeks. Social support is often all that is needed. You will be encouraged to get adequate sleep and rest. Follow these instructions at home: Lifestyle      Get as much rest as you can. Take a nap when the baby sleeps.  Exercise regularly as told by your health care provider. Some women find yoga and walking to be helpful.  Eat a balanced and nourishing diet. This includes plenty of fruits and vegetables, whole grains, and lean proteins.  Do little things that you enjoy. Have a cup of tea, take a bubble bath, read your favorite magazine, or listen to your favorite music.  Avoid alcohol.  Ask for help with household chores, cooking, grocery shopping, or running errands. Do not try to do everything yourself. Consider hiring a postpartum doula to help. This is a professional who specializes in providing support to new mothers.  Try not to make any major life changes during pregnancy or right after giving birth. This can add stress. General instructions  Talk to people close to you about how you are feeling. Get support from your partner, family members, friends, or other new moms. You may want to join a support group.  Find ways to cope with stress. This may include: ? Writing your thoughts and feelings in a journal. ? Spending time outside. ? Spending time with people who make you laugh.  Try to stay positive in how you think. Think about the things you are grateful for.  Take over-the-counter and prescription medicines only as told by your health care provider.  Let your health care provider know if you have any concerns.  Keep all postpartum visits as told by your health care provider. This is important. Contact a health care provider if:  Your baby blues do not go away after 2 weeks. Get help right away if:  You have thoughts of taking your own life (suicidal  thoughts).  You think you may harm the baby or other people.  You see or hear things that are not there (hallucinations). Summary  After giving birth, you may feel happy one minute and sad or stressed the next. Feelings of sadness that happen right after the baby is born and go away after a week or two are called the "baby blues."  You can manage the baby blues by getting enough rest, eating a healthy diet, exercising, spending time with supportive people, and finding ways to cope with stress.  If feelings of sadness and stress last longer than 2 weeks or get in the way of caring for your baby, talk to your health care provider. This may mean you have postpartum depression. This information is not intended to replace advice given to you by your health care provider. Make sure you discuss any questions you have with your health care provider. Document Released: 04/18/2004 Document Revised: 11/06/2018 Document Reviewed: 09/10/2016 Elsevier Patient Education  2020 [employer].

## 2019-04-20 NOTE — Progress Notes (Signed)
TELEHEALTH POSTPARTUM TELEPHONE VISIT ENCOUNTER NOTE   Provider location: [facility] [employer] at [facility]   I connected with Latoya Palmer on 04/20/19 at  2:15 PM EDT by telephone Encounter at home and verified that I am speaking with the correct person using two identifiers.    I discussed the limitations, risks, security and privacy concerns of performing an evaluation and management service virtually and the availability of in person appointments. I also discussed with the patient that there may be a patient responsible charge related to this service. The patient expressed understanding and agreed to proceed.  Chief Complaint: Postpartum Visit  History of Present Illness: Latoya Palmer is a 33 y.o. African-American [id number] being evaluated for postpartum followup.    She is s/p SVD on 03/17/2019 at 38 weeks; she was discharged to home on 03/18/2019. Pregnancy complicated by Di/di twins . Baby is doing well.  Complains of constipation & depression.  States the has a BM every other day; similar to her pattern prior too pregnancy. Took a stool softener today.  Was told by her babys' pediatrician that she appeared depressed based on a questionnaire she filled out during their visit. States she feels like she had some baby blues after delivery that improved within 2 weeks. At times feels sad & overwhelmed d/t having twin babies & no having support of the FOB. No SI.   Vaginal bleeding or discharge: No  Intercourse: No  Contraception: no method Mode of feeding infant: Bottle PP depression s/s: No .  Any bowel or bladder issues: Yes  Pap smear: no abnormalities (date: 03/23/2018)  Review of Systems: Positive for depression & constipaion. Her 12 point review of systems is negative or as noted in the History of Present Illness.  Patient Active Problem List   Diagnosis Date Noted  . Twin gestation in third trimester 03/16/2019  . Anemia in pregnancy 09/11/2018  .  Supervision of high risk pregnancy, antepartum 09/01/2018  . History of cervical cone biopsy affecting care of mother in first trimester, antepartum 08/04/2018  . Dichorionic diamniotic twin gestation 08/04/2018    Medications Latoya Palmer. Pointer had no medications administered during this visit. Current Outpatient Medications  Medication Sig Dispense Refill  . ferrous sulfate (FERROUSUL) 325 (65 FE) MG tablet Take 1 tablet (325 mg total) by mouth 2 (two) times daily. (Patient taking differently: Take 325 mg by mouth daily with breakfast. ) 60 tablet 1  . Prenatal Vit-Fe Fumarate-FA (PRENATAL MULTIVITAMIN) TABS tablet Take 1 tablet by mouth daily at 12 noon.    . Vitamin D, Ergocalciferol, (DRISDOL) 50000 units CAPS capsule Take 1 capsule (50,000 Units total) by mouth every 7 (seven) days. (Patient taking differently: Take 50,000 Units by mouth every 7 (seven) days. Takes on Friday.) 8 capsule 1  . calcium carbonate (TUMS - DOSED IN MG ELEMENTAL CALCIUM) 500 MG chewable tablet Chew 1 tablet by mouth 3 (three) times daily as needed for indigestion or heartburn.    Marland Kitchen ibuprofen (ADVIL) 600 MG tablet Take 1 tablet (600 mg total) by mouth every 6 (six) hours. (Patient not taking: Reported on 04/20/2019) 30 tablet 0   No current facility-administered medications for this visit.     Allergies Latex  Physical Exam:  LMP 06/10/2018  Reviewed from Babyscripts General:  Alert, oriented and cooperative. Patient is in no acute distress.  Mental Status: Normal mood and affect. Normal behavior. Normal judgment and thought content.   Respiratory: Normal respiratory effort noted, no  problems with respiration noted  Rest of physical exam deferred due to type of encounter  PP Depression Screening:   Edinburgh Postnatal Depression Scale Screening Tool 04/20/2019 03/18/2019 03/17/2019  I have been able to laugh and see the funny side of things. 0 0 (No Data)  I have looked forward with enjoyment to things. 0 0  -  I have blamed myself unnecessarily when things went wrong. 0 1 -  I have been anxious or worried for no good reason. 0 0 -  I have felt scared or panicky for no good reason. 0 0 -  Things have been getting on top of me. 1 1 -  I have been so unhappy that I have had difficulty sleeping. 0 0 -  I have felt sad or miserable. 1 1 -  I have been so unhappy that I have been crying. 0 0 -  The thought of harming myself has occurred to me. 0 0 -  Edinburgh Postnatal Depression Scale Total 2 3 -     Assessment:Patient is a 33 y.o. [id number] who is 4 weeks postpartum from a normal spontaneous vaginal delivery.  She is doing well.   Plan: Pt had HSIL & CIN 3 in 2018. F/u pap last year was normal. Needs pap this year and if still normal can resume normal scheduling.  Will also schedule patient to see Latoya Palmer when she comes in for her pap smear. She states she would like to speak with her. GAD/PDQ score ok today.   1. Encounter for routine postpartum follow-up   2. Postpartum depression      RTC 2 weeks for pap & to see Latoya Palmer  I discussed the assessment and treatment plan with the patient. The patient was provided an opportunity to ask questions and all were answered. The patient agreed with the plan and demonstrated an understanding of the instructions.   The patient was advised to call back or seek an in-person evaluation/go to the ED for any concerning postpartum symptoms.  I provided 10 minutes of face-to-face time during this encounter.   Ernestina Patches, CMA [facility] [employer], [facility]

## 2019-04-21 ENCOUNTER — Encounter: Payer: Self-pay | Admitting: Student

## 2019-04-21 DIAGNOSIS — D069 Carcinoma in situ of cervix, unspecified: Secondary | ICD-10-CM | POA: Insufficient documentation

## 2019-05-06 ENCOUNTER — Ambulatory Visit: Payer: Medicaid Other | Admitting: Obstetrics and Gynecology

## 2019-05-06 ENCOUNTER — Ambulatory Visit: Payer: Medicaid Other

## 2019-05-10 NOTE — BH Specialist Note (Signed)
Integrated Behavioral Health via Telemedicine Telephone Visit  05/10/2019 Latoya Palmer [id number]  Number of [place] visits: 1 (2 total) Session Start time: 4:22 Session End time: 5:10 Total time: 48 minutes  Referring Provider: Maye Hides, CNM Type of Visit: Telephone Patient/Family location: Home [facility] Provider location: Home All persons participating in visit: Patient Latoya Palmer and [place]  Confirmed patient's address: Yes  Confirmed patient's phone number: Yes  Any changes to demographics: No   Confirmed patient's insurance: Yes  Any changes to patient's insurance: No   Discussed confidentiality: Yes   I connected with Latoya Palmer  by a video enabled telemedicine application and verified that I am speaking with the correct person using two identifiers.     I discussed the limitations of evaluation and management by telemedicine and the availability of in person appointments.  I discussed that the purpose of this visit is to provide behavioral health care while limiting exposure to the novel coronavirus.   Discussed there is a possibility of technology failure and discussed alternative modes of communication if that failure occurs.  I discussed that engaging in this video visit, they consent to the provision of behavioral healthcare and the services will be billed under their insurance.  Patient and/or legal guardian expressed understanding and consented to video visit: Yes   PRESENTING CONCERNS: Patient and/or family reports the following symptoms/concerns: Pt states her primary concern today is feeling overwhelmed with life stress, after FOBs "checked out" emotionally, physically, and practically, after birth of twins. Pt wants to have better control over her feelings of anger and worry. Pt open to learn self-coping strategy today, to implement prior to returning to work.  Duration of problem: About 2 months; Severity of  problem: moderate  STRENGTHS (Protective Factors/Coping Skills): Strong social support (Mother comes over daily to help with babies)  GOALS ADDRESSED: Patient will: 1.  Reduce symptoms of: anxiety, stress and anger  2.  Increase knowledge and/or ability of: healthy habits and self-management skills  3.  Demonstrate ability to: Increase healthy adjustment to current life circumstances and Increase adequate support systems for patient/family  INTERVENTIONS: Interventions utilized:  Mindfulness or Psychologist, educational, Psychoeducation and/or Health Education and Link to [employer] Standardized Assessments completed: GAD-7 and PHQ 9  ASSESSMENT: Patient currently experiencing Adjustment disorder with  Other symptom (anger and loss of control) Patient may benefit from psychoeducation and brief therapeutic interventions regarding coping with symptoms of anger and loss of control .  PLAN: 1. Follow up with behavioral health clinician on : Two weeks 2. Behavioral recommendations:  -Continue allowing family and friends to help, as needed -Continue taking iron pills as prescribed (begin taking with OJ, instead of water, as able) -CALM relaxation breathing exercise twice daily(morning; at bedtime) -Consider using apps as additional self-care, as needed (share with 24yo daughter) -Consider joining either Mom Talk online support group for new moms or Mothers of Multiples group, prior to returning to work -Continue to use community resources as needed, without guilt during this time in life 3. Referral(s): Integrated Orthoptist (In Clinic) and [employer] Resources:  New mom support  I discussed the assessment and treatment plan with the patient and/or parent/guardian. They were provided an opportunity to ask questions and all were answered. They agreed with the plan and demonstrated an understanding of the instructions.   They were advised to call back or seek an in-person  evaluation if the symptoms worsen or if the condition fails to improve  as anticipated.  Valetta Close McMannes  Depression screen [facility] 2/9 05/11/2019 01/20/2019 09/01/2018 03/23/2018 10/24/2016  Decreased Interest 0 0 0 0 1  Down, Depressed, Hopeless 1 0 0 0 0  PHQ - 2 Score 1 0 0 0 1  Altered sleeping 0 0 0 0 0  Tired, decreased energy 1 0 3 0 1  Change in appetite 0 0 0 0 0  Feeling bad or failure about yourself  2 0 0 0 0  Trouble concentrating 0 0 0 0 0  Moving slowly or fidgety/restless 0 0 0 0 0  Suicidal thoughts 0 0 0 0 0  PHQ-9 Score 4 0 3 0 2   GAD 7 : Generalized Anxiety Score 05/11/2019 01/20/2019 09/01/2018 03/23/2018  Nervous, Anxious, on Edge 3 0 0 0  Control/stop worrying 2 0 0 0  Worry too much - different things 2 0 0 0  Trouble relaxing 0 0 0 0  Restless 0 0 0 0  Easily annoyed or irritable 3 0 3 0  Afraid - awful might happen 0 0 0 0  Total GAD 7 Score 10 0 3 0

## 2019-05-11 ENCOUNTER — Ambulatory Visit (INDEPENDENT_AMBULATORY_CARE_PROVIDER_SITE_OTHER): Payer: Medicaid Other | Admitting: Student

## 2019-05-11 ENCOUNTER — Other Ambulatory Visit: Payer: Self-pay

## 2019-05-11 ENCOUNTER — Other Ambulatory Visit (HOSPITAL_COMMUNITY)
Admission: RE | Admit: 2019-05-11 | Discharge: 2019-05-11 | Disposition: A | Discharge status: Home / Self Care | Payer: Medicaid Other | Source: Ambulatory Visit | Attending: Obstetrics and Gynecology | Admitting: Obstetrics and Gynecology

## 2019-05-11 ENCOUNTER — Ambulatory Visit (INDEPENDENT_AMBULATORY_CARE_PROVIDER_SITE_OTHER): Payer: Medicaid Other | Admitting: Clinical

## 2019-05-11 VITALS — BP 127/91 | Wt 148.8 lb

## 2019-05-11 DIAGNOSIS — Z01419 Encounter for gynecological examination (general) (routine) without abnormal findings: Secondary | ICD-10-CM | POA: Diagnosis not present

## 2019-05-11 DIAGNOSIS — F4329 Adjustment disorder with other symptoms: Secondary | ICD-10-CM | POA: Diagnosis not present

## 2019-05-11 DIAGNOSIS — Z124 Encounter for screening for malignant neoplasm of cervix: Secondary | ICD-10-CM

## 2019-05-11 NOTE — Patient Instructions (Signed)
[place] at [employer] or [phone]  /Emotional [employer] and Websites Here are a few free apps meant to help you to help yourself.  To find, try searching on the internet to see if the app is offered on Apple/Android devices. If your first choice doesn't come up on your device, the good news is that there are many choices! Play around with different apps to see which ones are helpful to you.    Calm This is an app meant to help increase calm feelings. Includes info, strategies, and tools for tracking your feelings.      Calm Harm  This app is meant to help with self-harm. Provides many 5-minute or 15-min coping strategies for doing instead of hurting yourself.       M[place]quite is a problem-solving tool to help deal with emotions and cope with stress you encounter wherever you are.      MindShift This app can help people cope with anxiety. Rather than trying to avoid anxiety, you can make an important shift and face it.      MY3  MY3 features a support system, safety plan and resources with the goal of offering a tool to use in a time of need.       My Life My Voice  This mood journal offers a simple solution for tracking your thoughts, feelings and moods. Animated emoticons can help identify your mood.       Relax Melodies Designed to help with sleep, on this app you can mix sounds and meditations for relaxation.      Smiling Mind Smiling Mind is meditation made easy: it's a simple tool that helps put a smile on your mind.        Stop, Breathe & Think  A friendly, simple guide for people through meditations for mindfulness and compassion.  Stop, Breathe and Think Kids Enter your current feelings and choose a "mission" to help you cope. Offers videos for certain moods instead of just sound recordings.       Team Orange The goal of this tool is to help teens change how they think, act, and react. This app  helps you focus on your own good feelings and experiences.      The [place] Box The [place] Box (VHB) contains simple tools to help patients with coping, relaxation, distraction, and positive thinking.

## 2019-05-11 NOTE — Progress Notes (Signed)
History:  [name] is a 33 y.o. [phone] who presents to clinic today for repeat pap smear and to discuss her urinary incontinence after childbirth. She also endorses some pain in her right arm from her IV and pain in her arms when she walks. She feels like she has some tingling in her legs.   She had abnormal pap; then normal pap. Per guidelines, she should have repeat pap today. If normal, she can return to regular screening.   The following portions of the patient's history were reviewed and updated as appropriate: allergies, current medications, family history, past medical history, social history, past surgical history and problem list.  Review of Systems:  Review of Systems  Constitutional: Negative.   HENT: Negative.   Respiratory: Negative.   Cardiovascular: Negative.   Genitourinary: Negative.   Musculoskeletal: Negative.   Skin: Negative.   Neurological: Positive for tingling.  Psychiatric/Behavioral: Negative.       Objective:  Physical Exam BP (!) 127/91   Wt 148 lb 12.8 oz (67.5 kg)   BMI 24.76 kg/m  Physical Exam  Constitutional: She is oriented to person, place, and time. She appears well-developed.  HENT:  Head: Normocephalic.  Neck: Normal range of motion.  Respiratory: Effort normal and breath sounds normal.  GI: Soft.  Genitourinary:    Vagina normal.     No vaginal discharge.     Genitourinary Comments: NEFG; no blood or discharge in the vagina. Cervix is pink, no lesions or masses, no bleeding after pap. No CMT, suprapubic or adnexal tenderness.    Musculoskeletal: Normal range of motion.  Neurological: She is alert and oriented to person, place, and time.  Skin: Skin is warm and dry.  Psychiatric: She has a normal mood and affect.      Labs and Imaging No results found for this or any previous visit (from the past 24 hour(s)).  No results found.   Assessment & Plan:  1. Encounter for well woman exam -Patient's visit was shortened  due to need to get home with infants (twins) and need for appt with Roselyn Reef.  -Briefly discussed that I would place referrals for her; reassured her that her cervix appears normal and her pelvic exam was benign.  -Talked about coping mechanisms for life with twins, offered guidance.  - Cytology - PAP( [place]) - Ambulatory referral to [facility] - Ambulatory referral to Physical Therapy   Starr Lake, CNM 05/11/2019 4:45 PM

## 2019-05-13 ENCOUNTER — Ambulatory Visit: Payer: No Typology Code available for payment source | Attending: Student | Admitting: Physical Therapy

## 2019-05-18 ENCOUNTER — Encounter: Payer: Self-pay | Admitting: Student

## 2019-05-18 ENCOUNTER — Other Ambulatory Visit: Payer: Self-pay | Admitting: Student

## 2019-05-18 DIAGNOSIS — R32 Unspecified urinary incontinence: Secondary | ICD-10-CM | POA: Insufficient documentation

## 2019-05-18 DIAGNOSIS — M792 Neuralgia and neuritis, unspecified: Secondary | ICD-10-CM | POA: Insufficient documentation

## 2019-05-19 LAB — CYTOLOGY - PAP
Comment: NEGATIVE
Diagnosis: NEGATIVE
Diagnosis: REACTIVE
High risk HPV: NEGATIVE

## 2019-05-25 ENCOUNTER — Ambulatory Visit: Payer: Medicaid Other | Admitting: Clinical

## 2019-05-25 ENCOUNTER — Other Ambulatory Visit: Payer: Self-pay

## 2019-05-25 NOTE — BH Specialist Note (Signed)
error 

## 2019-05-27 ENCOUNTER — Encounter (HOSPITAL_COMMUNITY): Payer: Self-pay | Admitting: Emergency Medicine

## 2019-05-27 ENCOUNTER — Other Ambulatory Visit: Payer: Self-pay

## 2019-05-27 ENCOUNTER — Emergency Department (HOSPITAL_COMMUNITY)
Admission: EM | Admit: 2019-05-27 | Discharge: 2019-05-27 | Disposition: A | Discharge status: Home / Self Care | Payer: Medicaid Other | Attending: Emergency Medicine | Admitting: Emergency Medicine

## 2019-05-27 DIAGNOSIS — Z79899 Other long term (current) drug therapy: Secondary | ICD-10-CM | POA: Diagnosis not present

## 2019-05-27 DIAGNOSIS — Z91040 Latex allergy status: Secondary | ICD-10-CM | POA: Diagnosis not present

## 2019-05-27 DIAGNOSIS — S161XXA Strain of muscle, fascia and tendon at neck level, initial encounter: Secondary | ICD-10-CM | POA: Diagnosis not present

## 2019-05-27 DIAGNOSIS — M542 Cervicalgia: Secondary | ICD-10-CM | POA: Diagnosis present

## 2019-05-27 DIAGNOSIS — Y929 Unspecified place or not applicable: Secondary | ICD-10-CM | POA: Diagnosis not present

## 2019-05-27 DIAGNOSIS — Y999 Unspecified external cause status: Secondary | ICD-10-CM | POA: Diagnosis not present

## 2019-05-27 DIAGNOSIS — Y939 Activity, unspecified: Secondary | ICD-10-CM | POA: Insufficient documentation

## 2019-05-27 DIAGNOSIS — R519 Headache, unspecified: Secondary | ICD-10-CM | POA: Diagnosis not present

## 2019-05-27 DIAGNOSIS — Z87891 Personal history of nicotine dependence: Secondary | ICD-10-CM | POA: Insufficient documentation

## 2019-05-27 DIAGNOSIS — R030 Elevated blood-pressure reading, without diagnosis of hypertension: Secondary | ICD-10-CM

## 2019-05-27 DIAGNOSIS — I10 Essential (primary) hypertension: Secondary | ICD-10-CM | POA: Insufficient documentation

## 2019-05-27 NOTE — Discharge Instructions (Addendum)
Recheck your blood pressure at home on a normal day. Contact your primary care provider if it continues to read high (140/80 or higher).
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
## 2019-06-01 ENCOUNTER — Other Ambulatory Visit: Payer: Self-pay

## 2019-06-01 ENCOUNTER — Ambulatory Visit (INDEPENDENT_AMBULATORY_CARE_PROVIDER_SITE_OTHER): Payer: Medicaid Other | Admitting: Clinical

## 2019-06-01 DIAGNOSIS — F4329 Adjustment disorder with other symptoms: Secondary | ICD-10-CM
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
## 2019-06-04 ENCOUNTER — Ambulatory Visit: Payer: Self-pay | Admitting: Obstetrics and Gynecology

## 2019-06-07 ENCOUNTER — Other Ambulatory Visit: Payer: Self-pay

## 2019-06-07 DIAGNOSIS — Z20822 Contact with and (suspected) exposure to covid-19: Secondary | ICD-10-CM

## 2019-06-08 LAB — NOVEL CORONAVIRUS, NAA: SARS-CoV-2, NAA: NOT DETECTED

## 2019-06-15 ENCOUNTER — Ambulatory Visit (INDEPENDENT_AMBULATORY_CARE_PROVIDER_SITE_OTHER): Payer: Self-pay | Admitting: Advanced Practice Midwife

## 2019-06-15 ENCOUNTER — Encounter: Payer: Self-pay | Admitting: Advanced Practice Midwife

## 2019-06-15 ENCOUNTER — Other Ambulatory Visit: Payer: Self-pay

## 2019-06-15 VITALS — BP 127/88 | HR 79 | Temp 98.1°F | Ht 65.0 in | Wt 150.8 lb

## 2019-06-15 DIAGNOSIS — N3941 Urge incontinence: Secondary | ICD-10-CM

## 2019-06-15 NOTE — Progress Notes (Signed)
  Subjective:     Patient ID: Latoya Palmer, female   DOB: May 14, 1986, 33 y.o.   MRN: [id number]  Latoya Palmer is a 33 y.o. [id number] who is here with urgency and urge incontinence. She states that this has been going on since the birth of her twin babies on 03/17/2019. It was discussed at her last visit here on 05/11/2019. She was told that it would likely improve when she was here for her PP visit. She states that it has not improved. At every urination she needs to immediately go to the bathroom or she will have leakage. She is concerned that she did not have her obstetrical laceration repaired properly, and she is considering a lawsuit due to this concern.   Urinary Frequency  This is a new problem. The current episode started more than 1 month ago. The problem occurs every urination.     Review of Systems  All other systems reviewed and are negative.      Objective:   Physical Exam Vitals signs and nursing note reviewed.  Constitutional:      Appearance: Normal appearance.  HENT:     Head: Normocephalic.  Cardiovascular:     Rate and Rhythm: Normal rate.  Pulmonary:     Effort: Pulmonary effort is normal.  Neurological:     General: No focal deficit present.     Mental Status: She is alert and oriented to person, place, and time.  Psychiatric:        Mood and Affect: Mood normal.        Behavior: Behavior normal.    Patient states that she is currently having heavy menstrual bleeding and declines pelvic exam today     Assessment:     1. Urge incontinence of urine    Plan:     Offered patient pelvic floor PT - she declines this at this time  Patient prefers referral to urology at this time   Orders Placed This Encounter  Procedures  . Ambulatory referral to Urology    Referral Priority:   Routine    Referral Type:   Consultation    Referral Reason:   Specialty Services Required    Requested Specialty:   Urology    Number of Visits Requested:   Latoya Palmer [facility], CNM  06/15/19  4:18 PM

## 2019-06-15 NOTE — Progress Notes (Signed)
Pt states she has continued to have urinary urgency and incontinence after childbirth. This was discussed @ her PP visit on 10/13. She had hoped the symptoms would improve however it has not. Pt began her first menstrual cycle yesterday and is having a heavy flow.  Following appt w/Heather Norman Herrlich, pt was scheduled for referral appt @ [facility] on 12/31 @ 1pm. She was advised of all appt details, [facility] contact info and charge for the visit as given to me by [facility] staff. Pt voiced understanding.

## 2019-06-16 ENCOUNTER — Encounter: Payer: Self-pay | Admitting: Federally Qualified Health Center (FQHC)

## 2019-06-16 ENCOUNTER — Encounter: Payer: Self-pay | Admitting: Internal Medicine

## 2019-06-22 ENCOUNTER — Ambulatory Visit: Payer: No Typology Code available for payment source | Attending: Student | Admitting: Physical Therapy

## 2019-08-19 ENCOUNTER — Telehealth: Payer: Self-pay | Admitting: General Practice

## 2019-08-19 NOTE — Telephone Encounter (Signed)
Patient called into front office returning phone call.   Called patient, no answer- left message to call us back.

## 2019-08-19 NOTE — Telephone Encounter (Signed)
Patient called and left message on nurse voicemail line requesting refill on 2 day clindamycin for BV.   Called patient, no answer- left message requesting she return phone call.

## 2019-08-20 ENCOUNTER — Other Ambulatory Visit: Payer: Self-pay | Admitting: Family Medicine

## 2019-08-20 MED ORDER — TINIDAZOLE 500 MG PO TABS: ORAL_TABLET | ORAL | 0 refills | Status: AC

## 2019-08-20 NOTE — Progress Notes (Signed)
Has BV--wants Tindamax--rx sent in. Not nursing.

## 2019-09-02 ENCOUNTER — Telehealth: Payer: Self-pay | Admitting: *Deleted

## 2019-09-02 DIAGNOSIS — N760 Acute vaginitis: Secondary | ICD-10-CM

## 2019-09-02 DIAGNOSIS — B9689 Other specified bacterial agents as the cause of diseases classified elsewhere: Secondary | ICD-10-CM

## 2019-09-02 NOTE — Telephone Encounter (Signed)
Latoya Palmer called re issue with her Clindamycin. She reports that hte pharmacy said it needs prior authorization. I infomed her we will work on getting it approved and also confirmed her insurance is Medicaid. I informed her I do not see her Medicaid information. She gave her  her information: Medicaid [id number] N. We may also need to notify pharmacy since appears is old order.  Latoya Moroz,RN

## 2019-09-02 NOTE — Telephone Encounter (Signed)
I called Latoya Palmer back and informed her I do not see that we have sent in a prescription recently but that she requested one 08/19/19. She states Dr.Davis called in a prescription and that we can call her pharmacy to verify- [facility] on [address]. I informed her I will call [facility].  I called [facility]- but no one answered after many rings.  Joanell Cressler,RN

## 2019-09-03 MED ORDER — CLINDAMYCIN HCL 150 MG PO CAPS: 150.0000 mg | ORAL_CAPSULE | Freq: Three times a day (TID) | ORAL | 0 refills | Status: AC

## 2019-09-03 NOTE — Telephone Encounter (Signed)
I called [facility] and they verified Clindamycin has not been ordered. Per chart review Dr. Shawnie Pons ordered Tindamax on 08/20/19. I verified with [facility] they received the order, patient has not picked up and it requires prior authorization by calling [phone].

## 2019-09-03 NOTE — Telephone Encounter (Signed)
I called patient to notify that I am trying to get her medicine approved but have not been able to get thru [facility] Tracks/ Medicaid and we close today at 12 noon, will try again Monday. She reports she has to have something today or she is going to the ER which she can't pay.I asked what else she would like to try since in her chart she has tried flagyl and metrogel with allergic reactions, and clindamycin with no relief and then tindamax which helped. She would like rx for clindamycin today - she states it is not that it doesn't help - but it has to be alternated with tindamax because she gets resistent because she takes them so often. I also verified what symptoms she is having and she reports same as always and should be in her chart but does report vaginal discharge, bad odor, irritation. I explained I will need to discuss with provider and get an order- which I did. I informed her Dr. Debroah Loop approved Clindamycin and if that doesn't help to call us back and we can try again to get the Tindamax approved.  She voices understanding. Darroll Bredeson,RN

## 2019-09-03 NOTE — Telephone Encounter (Signed)
Per chart review has tried flagyl, metrogel in past with some allergic symptoms, has tried clindamycin with no relief of symptoms-then tried Tindamax with relief . Medicaid number [id number] N. I called [place] Tracks/ Medicaid  Twice to obtain prior- auth and keep getting call dropped out of que.  Granger Chui,RN

## 2019-12-15 ENCOUNTER — Ambulatory Visit: Payer: Self-pay

## 2019-12-29 ENCOUNTER — Ambulatory Visit: Payer: Self-pay

## 2020-01-05 ENCOUNTER — Encounter: Payer: Self-pay | Admitting: Internal Medicine

## 2020-01-05 ENCOUNTER — Other Ambulatory Visit: Payer: Self-pay

## 2020-01-05 ENCOUNTER — Ambulatory Visit: Payer: Medicaid Other | Admitting: Internal Medicine

## 2020-01-05 VITALS — BP 124/71 | HR 65 | Temp 98.3°F | Ht 65.0 in | Wt 156.1 lb

## 2020-01-05 DIAGNOSIS — L209 Atopic dermatitis, unspecified: Secondary | ICD-10-CM | POA: Diagnosis not present

## 2020-01-05 DIAGNOSIS — D069 Carcinoma in situ of cervix, unspecified: Secondary | ICD-10-CM | POA: Diagnosis not present

## 2020-01-05 DIAGNOSIS — N879 Dysplasia of cervix uteri, unspecified: Secondary | ICD-10-CM | POA: Diagnosis not present

## 2020-01-05 DIAGNOSIS — E559 Vitamin D deficiency, unspecified: Secondary | ICD-10-CM

## 2020-01-05 MED ORDER — TRIAMCINOLONE ACETONIDE 0.1 % EX OINT: 1.0000 "application " | TOPICAL_OINTMENT | Freq: Two times a day (BID) | CUTANEOUS | 3 refills | Status: DC

## 2020-01-05 NOTE — Patient Instructions (Signed)
Thank you for seeing [place] today.  Below is a summary of what we discussed:  1.  Atopic dermatitis -I prescribed triamcinolone ointment that you can put on your eczema up to twice a day as needed.  Do not apply this medication if there is no rash present. -I also referred you to dermatology.  You can call the number below to schedule an appointment.  If you have any questions or concerns, please feel free to call us at any time!

## 2020-01-05 NOTE — Progress Notes (Signed)
New Patient Office Visit  Subjective:  Patient ID: Latoya Palmer, female    DOB: 1986-05-11  Age: 34 y.o. MRN: [id number]  CC:  Chief Complaint  Patient presents with  . Establish Care   HPI Latoya Palmer is a 35 year old female who presents to establish care.  The patient states she has not been seen by primary care physician for years and wanted to start seeing one on a regular basis for health maintenance.  The patient states that she has 2 main concerns today.  Since losing her job prior to the pandemic, she is been experiencing an outbreak of skin rashes upon the anterior aspect of her elbows as well as the back of her knees.  She does not have a history of atopic dermatitis, as a started roughly 1 year ago.  She is also complains of having intermittent acne and was hoping to get a dermatology referral.  Secondly, the patient states that she had some elevated blood pressures around the time she gave birth to her twin babies 9 months ago, but does not remember the exact numbers.  She says she has never had blood pressure issues with this was concerning to her.  I reassured her that her blood pressures were normal today.  The patient is asking for dermatology referral to help with her skin rashes and acne.  Otherwise, the patient denies any dizziness, vision changes, headaches, chest pain, SOB, or changes in her bowel or bladder habits.  Past Medical History:  Diagnosis Date  . Abnormal Pap smear   . Anemia    with pregnancy  . Bacterial vaginosis   . Headache(784.0)    hx - last one 09/2016 -otc prn  . Heart murmur    hx - no problems; June - heart u/s "left chamber enlarged"  . HGSIL (high grade squamous intraepithelial lesion) on Pap smear of cervix 07/19/2016   [ ]  Colposcopy and ECC  . Miscarriage 09/17/2013   07/2013 - miscarriage - reeval BHCG to ensure resolved.   . Ovarian cyst   . Pneumonia 2018  . Preterm labor   . SVD (spontaneous vaginal delivery) 2008   x 1    Past Surgical History:  Procedure Laterality Date  . CERVICAL CONIZATION W/BX N/A 11/05/2016   Procedure: CONIZATION CERVIX WITH BIOPSY - COLD KNIFE;  Surgeon: 01/05/2017, MD;  Location: [facility];  Service: Gynecology;  Laterality: N/A;  . CESAREAN SECTION N/A 03/17/2019   Procedure: VAGINAL DELIVERY OF TWINS IN OPERATING ROOM;  Surgeon: 03/19/2019, MD;  Location: [facility];  Service: Obstetrics;  Laterality: N/A;  . COLPOSCOPY    . MYRINGOTOMY    . THERAPEUTIC ABORTION     two   Family History  Problem Relation Age of Onset  . Heart attack Maternal Grandmother 50  . Cancer Neg Hx   . Diabetes Neg Hx   . Kidney disease Neg Hx   . Hypertension Neg Hx    Maternal grandfather - prostate cancer, leukemia  Social History   Socioeconomic History  . Marital status: Single    Spouse name: Not on file  . Number of children: Not on file  . Years of education: Not on file  . Highest education level: Not on file  Occupational History  . Not on file  Tobacco Use  . Smoking status: Former Smoker    Packs/day: 0.50    Years: 11.00    Pack years: 5.50    Types:  Cigarettes    Quit date: 07/24/2018    Years since quitting: 1.4  . Smokeless tobacco: Never Used  Substance and Sexual Activity  . Alcohol use: No  . Drug use: No  . Sexual activity: Yes    Birth control/protection: None  Other Topics Concern  . Not on file  Social History Narrative  . Not on file   Social Determinants of Health   Financial Resource Strain: Low Risk   . Difficulty of Paying Living Expenses: Not hard at all  Food Insecurity: No Food Insecurity  . Worried About Charity fundraiser in the Last Year: Never true  . Ran Out of Food in the Last Year: Never true  Transportation Needs: Unknown  . Lack of Transportation (Medical): No  . Lack of Transportation (Non-Medical): Not on file  Physical Activity:   . Days of Exercise per Week:   . Minutes of Exercise per Session:   Stress: Stress Concern  Present  . Feeling of Stress : Very much  Social Connections:   . Frequency of Communication with Friends and Family:   . Frequency of Social Gatherings with Friends and Family:   . Attends Religious Services:   . Active Member of Clubs or Organizations:   . Attends Archivist Meetings:   Marland Kitchen Marital Status:   Intimate Partner Violence: Not At Risk  . Fear of Current or Ex-Partner: No  . Emotionally Abused: No  . Physically Abused: No  . Sexually Abused: No   ROS Review of Systems  All other systems reviewed and are negative.  Objective:   Today's Vitals: BP 124/71 (BP Location: Left Arm, Patient Position: Sitting, Cuff Size: Normal)   Pulse 65   Temp 98.3 F (36.8 C) (Oral)   Ht 5\' 5"  (1.651 m)   Wt 156 lb 1.6 oz (70.8 kg)   SpO2 99%   BMI 25.98 kg/m   Physical Exam Vitals reviewed.  Constitutional:      General: She is not in acute distress.    Appearance: Normal appearance. She is normal weight. She is not ill-appearing, toxic-appearing or diaphoretic.  HENT:     Head: Normocephalic and atraumatic.  Eyes:     Extraocular Movements: Extraocular movements intact.  Cardiovascular:     Rate and Rhythm: Normal rate and regular rhythm.     Heart sounds: Normal heart sounds. No murmur heard.  No friction rub. No gallop.   Pulmonary:     Effort: Pulmonary effort is normal. No respiratory distress.     Breath sounds: Normal breath sounds. No wheezing or rales.  Abdominal:     General: Abdomen is flat. Bowel sounds are normal. There is no distension.     Palpations: Abdomen is soft.     Tenderness: There is no abdominal tenderness. There is no guarding.  Musculoskeletal:     Right lower leg: No edema.     Left lower leg: No edema.  Skin:    General: Skin is warm.     Comments: Dark and flat patches on the anterior cubital fossa bialterally  Neurological:     General: No focal deficit present.     Mental Status: She is alert and oriented to person, place,  and time.     Motor: No weakness.     Coordination: Coordination normal.     Gait: Gait normal.  Psychiatric:        Mood and Affect: Mood normal.    Assessment & Plan:  Problem List Items Addressed This Visit    None     Outpatient Encounter Medications as of 01/05/2020  Medication Sig  . triamcinolone ointment (KENALOG) 0.1 % Apply 1 application topically 2 (two) times daily.  . Vitamin D, Ergocalciferol, (DRISDOL) 50000 units CAPS capsule Take 1 capsule (50,000 Units total) by mouth every 7 (seven) days. (Patient taking differently: Take 50,000 Units by mouth every 7 (seven) days. Takes on Friday.)   No facility-administered encounter medications on file as of 01/05/2020.   Follow-up: Return in about 6 months (around 07/06/2020).   Kirt Boys, MD Internal Medicine, PGY1 Pager: [phone]  01/05/2020,2:32 PM

## 2020-01-07 DIAGNOSIS — L209 Atopic dermatitis, unspecified: Secondary | ICD-10-CM | POA: Insufficient documentation

## 2020-01-07 NOTE — Assessment & Plan Note (Signed)
As noted in the HPI, the patient's been experiencing periodic outbreaks of pruritic rashes upon the anterior aspect of her elbows and the back of her knees.  She did not notice these rashes until she lost her job and was under a lot of stress.  She feels like her stress levels is what provoked the rash.  She does not have a history of seasonal allergies or rashes like this previously.  However, given the appearance and location of the rash, atopic dermatitis is most likely cause.  Plan: -Triamcinolone 0.1% ointment twice daily as needed ordered -Referral to dermatology placed

## 2020-01-07 NOTE — Assessment & Plan Note (Signed)
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  The patient gets Pap smears twice a year with her OB/GYN, with her last one being in October 2020 which was normal.  Plan: -Safe sex counseling administered -Follow-up with OB/GYN for next Pap smear, will discuss with patient at next visit

## 2020-01-10 NOTE — Progress Notes (Signed)
Internal Medicine Clinic Attending  Case discussed with Dr. Alexander at the time of the visit.  We reviewed the resident's history and exam and pertinent patient test results.  I agree with the assessment, diagnosis, and plan of care documented in the resident's note.  

## 2020-01-27 DIAGNOSIS — Z419 Encounter for procedure for purposes other than remedying health state, unspecified: Secondary | ICD-10-CM | POA: Diagnosis not present

## 2020-02-27 DIAGNOSIS — Z419 Encounter for procedure for purposes other than remedying health state, unspecified: Secondary | ICD-10-CM | POA: Diagnosis not present

## 2020-03-29 DIAGNOSIS — Z419 Encounter for procedure for purposes other than remedying health state, unspecified: Secondary | ICD-10-CM | POA: Diagnosis not present

## 2020-04-28 DIAGNOSIS — Z419 Encounter for procedure for purposes other than remedying health state, unspecified: Secondary | ICD-10-CM | POA: Diagnosis not present

## 2020-05-29 DIAGNOSIS — Z419 Encounter for procedure for purposes other than remedying health state, unspecified: Secondary | ICD-10-CM | POA: Diagnosis not present

## 2020-06-28 DIAGNOSIS — Z419 Encounter for procedure for purposes other than remedying health state, unspecified: Secondary | ICD-10-CM | POA: Diagnosis not present

## 2020-07-17 ENCOUNTER — Encounter: Payer: Self-pay | Admitting: *Deleted

## 2020-07-17 ENCOUNTER — Telehealth: Payer: Self-pay | Admitting: *Deleted

## 2020-07-17 ENCOUNTER — Telehealth: Payer: Medicaid Other | Admitting: Physician Assistant

## 2020-07-17 DIAGNOSIS — N760 Acute vaginitis: Secondary | ICD-10-CM

## 2020-07-17 MED ORDER — METRONIDAZOLE 500 MG PO TABS: 500.0000 mg | ORAL_TABLET | Freq: Two times a day (BID) | ORAL | 0 refills | Status: DC

## 2020-07-17 NOTE — Progress Notes (Signed)

## 2020-07-17 NOTE — Telephone Encounter (Signed)
Pt left VM message stating that she would like a refill of medication for BV. Per chart review, pt had [facility] e-visit today @ 1134 and was given Rx for Metronidazole. Mychart message sent to pt acknowledging her previous call.

## 2020-07-23 DIAGNOSIS — R531 Weakness: Secondary | ICD-10-CM | POA: Diagnosis not present

## 2020-07-23 DIAGNOSIS — R110 Nausea: Secondary | ICD-10-CM | POA: Diagnosis not present

## 2020-07-23 DIAGNOSIS — R519 Headache, unspecified: Secondary | ICD-10-CM | POA: Diagnosis not present

## 2020-07-23 DIAGNOSIS — U071 COVID-19: Secondary | ICD-10-CM | POA: Diagnosis not present

## 2020-07-29 DIAGNOSIS — Z419 Encounter for procedure for purposes other than remedying health state, unspecified: Secondary | ICD-10-CM | POA: Diagnosis not present

## 2020-07-31 ENCOUNTER — Telehealth: Payer: Medicaid Other | Admitting: Family

## 2020-07-31 DIAGNOSIS — Z30019 Encounter for initial prescription of contraceptives, unspecified: Secondary | ICD-10-CM

## 2020-07-31 NOTE — Progress Notes (Signed)
Based on what you shared with me, I feel your condition warrants further evaluation and I recommend that you be seen for a face to face office visit.  I am sorry, this is something you would need to see your PCP or GYN to get. We can not start birth control through an Evisit.      NOTE: If you entered your credit card information for this eVisit, you will not be charged. You may see a "hold" on your card for the $35 but that hold will drop off and you will not have a charge processed.   If you are having a true medical emergency please call 911.      For an urgent face to face visit, [facility] has five urgent care centers for your convenience:     [facility] at [facility] Directions [phone] [address] [place], [place] [address] . 10 am - 6pm Monday - Friday    [facility]) Get Driving Directions [phone] [address] [place], [place] [address] . 10 am to 8 pm Monday-Friday . 12 pm to 8 pm [facility]gent [facility] at [facility] Get Driving Directions [phone] [address] [place], [place] [address] . 8 am to 8 pm Monday-Friday . 9 am to 6 pm Saturday . 11 am to 6 pm Sunday     [facility] at [facility] Get Driving Directions  [phone] [address] [place], [place] [address] . 8 am to 8 pm Monday-Friday . 8 am to 4 pm [facility] Urgent Care at [facility] Directions [phone] [address] [place], [place] [address] . 12 pm to 6 pm Monday-Friday      Your e-visit answers were reviewed by a board certified advanced clinical practitioner to complete your personal care plan.  Thank you for using e-Visits.

## 2020-08-01 DIAGNOSIS — N898 Other specified noninflammatory disorders of vagina: Secondary | ICD-10-CM | POA: Diagnosis not present

## 2020-08-02 ENCOUNTER — Ambulatory Visit: Payer: Medicaid Other

## 2020-08-03 DIAGNOSIS — N898 Other specified noninflammatory disorders of vagina: Secondary | ICD-10-CM | POA: Diagnosis not present

## 2020-08-04 ENCOUNTER — Ambulatory Visit: Payer: Medicaid Other

## 2020-08-11 ENCOUNTER — Telehealth: Payer: Self-pay | Admitting: Family Medicine

## 2020-08-11 NOTE — Telephone Encounter (Signed)
Called and left patient a detailed message about appointment change due to the office being closed

## 2020-08-14 ENCOUNTER — Ambulatory Visit: Payer: Medicaid Other | Admitting: Family Medicine

## 2020-08-29 DIAGNOSIS — Z419 Encounter for procedure for purposes other than remedying health state, unspecified: Secondary | ICD-10-CM | POA: Diagnosis not present

## 2020-08-30 ENCOUNTER — Telehealth: Payer: Medicaid Other | Admitting: Nurse Practitioner

## 2020-08-30 DIAGNOSIS — N760 Acute vaginitis: Secondary | ICD-10-CM

## 2020-08-30 MED ORDER — FLUCONAZOLE 150 MG PO TABS: 150.0000 mg | ORAL_TABLET | Freq: Once | ORAL | 0 refills | Status: AC

## 2020-08-30 MED ORDER — METRONIDAZOLE 500 MG PO TABS: 500.0000 mg | ORAL_TABLET | Freq: Two times a day (BID) | ORAL | 0 refills | Status: DC

## 2020-08-30 NOTE — Addendum Note (Signed)
Addended by: Bennie Pierini on: 08/30/2020 02:18 PM   Modules accepted: Orders

## 2020-08-30 NOTE — Progress Notes (Signed)
We are sorry that you are not feeling well. Here is how we plan to help! Based on what you shared with me it looks like you: May have a vaginosis due to bacteria. Reviewing you r chart this is a reoccurrence from December. If this is to reoccur after treatmnet today you will need a face to face visit.  Vaginosis is an inflammation of the vagina that can result in discharge, itching and pain. The cause is usually a change in the normal balance of vaginal bacteria or an infection. Vaginosis can also result from reduced estrogen levels after menopause.  The most common causes of vaginosis are:   Bacterial vaginosis which results from an overgrowth of one on several organisms that are normally present in your vagina.   Yeast infections which are caused by a naturally occurring fungus called candida.   Vaginal atrophy (atrophic vaginosis) which results from the thinning of the vagina from reduced estrogen levels after menopause.   Trichomoniasis which is caused by a parasite and is commonly transmitted by sexual intercourse.  Factors that increase your risk of developing vaginosis include: Marland Kitchen Medications, such as antibiotics and steroids . Uncontrolled diabetes . Use of hygiene products such as bubble bath, vaginal spray or vaginal deodorant . Douching . Wearing damp or tight-fitting clothing . Using an intrauterine device (IUD) for birth control . Hormonal changes, such as those associated with pregnancy, birth control pills or menopause . Sexual activity . Having a sexually transmitted infection  Your treatment plan is Metronidazole or Flagyl 500mg  twice a day for 7 days.  I have electronically sent this prescription into the pharmacy that you have chosen.  Be sure to take all of the medication as directed. Stop taking any medication if you develop a rash, tongue swelling or shortness of breath. Mothers who are breast feeding should consider pumping and discarding their breast milk while on  these antibiotics. However, there is no consensus that infant exposure at these doses would be harmful.  Remember that medication creams can weaken latex condoms.   HOME CARE:  Good hygiene may prevent some types of vaginosis from recurring and may relieve some symptoms:  . Avoid baths, hot tubs and whirlpool spas. Rinse soap from your outer genital area after a shower, and dry the area well to prevent irritation. Don't use scented or harsh soaps, such as those with deodorant or antibacterial action. Marland Kitchen Avoid irritants. These include scented tampons and pads. . Wipe from front to back after using the toilet. Doing so avoids spreading fecal bacteria to your vagina.  Other things that may help prevent vaginosis include:  Marland Kitchen Don't douche. Your vagina doesn't require cleansing other than normal bathing. Repetitive douching disrupts the normal organisms that reside in the vagina and can actually increase your risk of vaginal infection. Douching won't clear up a vaginal infection. . Use a latex condom. Both female and female latex condoms may help you avoid infections spread by sexual contact. . Wear cotton underwear. Also wear pantyhose with a cotton crotch. If you feel comfortable without it, skip wearing underwear to bed. Yeast thrives in Marland Kitchen Your symptoms should improve in the next day or two.  GET HELP RIGHT AWAY IF:  . You have pain in your lower abdomen ( pelvic area or over your ovaries) . You develop nausea or vomiting . You develop a fever . Your discharge changes or worsens . You have persistent pain with intercourse . You develop shortness of breath, a  rapid pulse, or you faint.  These symptoms could be signs of problems or infections that need to be evaluated by a medical provider now.  MAKE SURE YOU    Understand these instructions.  Will watch your condition.  Will get help right away if you are not doing well or get worse.  Your e-visit answers were  reviewed by a board certified advanced clinical practitioner to complete your personal care plan. Depending upon the condition, your plan could have included both over the counter or prescription medications. Please review your pharmacy choice to make sure that you have choses a pharmacy that is open for you to pick up any needed prescription, Your safety is important to [place]. If you have drug allergies check your prescription carefully.   You can use MyChart to ask questions about today's visit, request a non-urgent call back, or ask for a work or school excuse for 24 hours related to this e-Visit. If it has been greater than 24 hours you will need to follow up with your provider, or enter a new e-Visit to address those concerns. You will get a MyChart message within the next two days asking about your experience. I hope that your e-visit has been valuable and will speed your recovery.  5-10 minutes spent reviewing and documenting in chart.

## 2020-09-04 ENCOUNTER — Other Ambulatory Visit: Payer: Self-pay

## 2020-09-04 ENCOUNTER — Ambulatory Visit (INDEPENDENT_AMBULATORY_CARE_PROVIDER_SITE_OTHER): Payer: Medicaid Other | Admitting: Nurse Practitioner

## 2020-09-04 ENCOUNTER — Encounter: Payer: Self-pay | Admitting: Nurse Practitioner

## 2020-09-04 ENCOUNTER — Other Ambulatory Visit (HOSPITAL_COMMUNITY)
Admission: RE | Admit: 2020-09-04 | Discharge: 2020-09-04 | Disposition: A | Discharge status: Home / Self Care | Payer: Medicaid Other | Source: Ambulatory Visit | Attending: Family Medicine | Admitting: Family Medicine

## 2020-09-04 VITALS — BP 122/66 | HR 65 | Ht 65.0 in | Wt 162.0 lb

## 2020-09-04 DIAGNOSIS — G43009 Migraine without aura, not intractable, without status migrainosus: Secondary | ICD-10-CM | POA: Diagnosis not present

## 2020-09-04 DIAGNOSIS — Z3202 Encounter for pregnancy test, result negative: Secondary | ICD-10-CM | POA: Diagnosis not present

## 2020-09-04 DIAGNOSIS — Z01419 Encounter for gynecological examination (general) (routine) without abnormal findings: Secondary | ICD-10-CM | POA: Insufficient documentation

## 2020-09-04 DIAGNOSIS — Z30011 Encounter for initial prescription of contraceptive pills: Secondary | ICD-10-CM | POA: Diagnosis not present

## 2020-09-04 DIAGNOSIS — D069 Carcinoma in situ of cervix, unspecified: Secondary | ICD-10-CM

## 2020-09-04 LAB — POCT PREGNANCY, URINE: Preg Test, Ur: NEGATIVE

## 2020-09-04 MED ORDER — NORGESTREL-ETHINYL ESTRADIOL 0.3-30 MG-MCG PO TABS: 1.0000 | ORAL_TABLET | Freq: Every day | ORAL | 2 refills | Status: DC

## 2020-09-04 NOTE — Progress Notes (Addendum)
GYNECOLOGY ANNUAL PREVENTATIVE CARE ENCOUNTER NOTE  Subjective:   Latoya Palmer is a 35 y.o. [id number] female here for a routine annual gynecologic exam.  Current complaints: No vaginal problems today, needs annual exam and has had problems with BV and yeast in the past.  Most recently treated for yeast after taking prednisone when she had Covid in December.   Denies abnormal vaginal bleeding, discharge, pelvic pain, problems with intercourse or other gynecologic concerns.    Gynecologic History Patient's last menstrual period was 08/10/2020 (exact date). Contraception: OCP (estrogen/progesterone)   Last Pap: 04-2019. Results were: normal  Patient requests pap today since she has a history of [facility] biopsy for CIN III with clear margins - has had 2 normal pap since but she is not ready to go to paps every 3 years.   Obstetric History OB History  Gravida Para Term Preterm AB Living  8 2 2  0 6 3  SAB IAB Ectopic Multiple Live Births  3 3 0 1 3    # Outcome Date GA Lbr Len/2nd Weight Sex Delivery Anes PTL Lv  8A Term 03/17/19 [redacted]w[redacted]d 13:47 / 00:34 6 lb 5.4 oz (2.875 kg) M Vag-Spont EPI  LIV  8B Term 03/17/19 [redacted]w[redacted]d 13:47 / 01:12 6 lb 3.5 oz (2.82 kg) M Vag-Spont EPI  LIV  7 SAB 08/26/13             Birth Comments: System Generated. Please review and update pregnancy details.  6 Term 09/26/06    F Vag-Spont EPI Y LIV  5 IAB           4 IAB           3 IAB           2 SAB           1 SAB             Past Medical History:  Diagnosis Date  . Abnormal Pap smear   . Anemia    with pregnancy  . Bacterial vaginosis   . Headache(784.0)    hx - last one 09/2016 -otc prn  . Heart murmur    hx - no problems; June - heart u/s "left chamber enlarged"  . HGSIL (high grade squamous intraepithelial lesion) on Pap smear of cervix 07/19/2016   [ ]  Colposcopy and ECC  . Miscarriage 09/17/2013   07/2013 - miscarriage - reeval BHCG to ensure resolved.   . Ovarian cyst   . Pneumonia 2018  .  Preterm labor   . SVD (spontaneous vaginal delivery) 2008   x 1    Past Surgical History:  Procedure Laterality Date  . CERVICAL CONIZATION W/BX N/A 11/05/2016   Procedure: CONIZATION CERVIX WITH BIOPSY - COLD KNIFE;  Surgeon: 2009, MD;  Location: [facility];  Service: Gynecology;  Laterality: N/A;  . CESAREAN SECTION N/A 03/17/2019   Procedure: VAGINAL DELIVERY OF TWINS IN OPERATING ROOM;  Surgeon: Allie Bossier, MD;  Location: [facility];  Service: Obstetrics;  Laterality: N/A;  . COLPOSCOPY    . MYRINGOTOMY    . THERAPEUTIC ABORTION     two    No current outpatient medications on file prior to visit.   No current facility-administered medications on file prior to visit.    Allergies  Allergen Reactions  . Latex Swelling and Other (See Comments)    Causes irritation    Social History   Socioeconomic History  . Marital status: Single  Spouse name: Not on file  . Number of children: Not on file  . Years of education: Not on file  . Highest education level: Not on file  Occupational History  . Not on file  Tobacco Use  . Smoking status: Former Smoker    Packs/day: 0.50    Years: 11.00    Pack years: 5.50    Types: Cigarettes    Quit date: 07/24/2018    Years since quitting: 2.1  . Smokeless tobacco: Never Used  Vaping Use  . Vaping Use: Never used  Substance and Sexual Activity  . Alcohol use: No  . Drug use: No  . Sexual activity: Yes    Birth control/protection: None  Other Topics Concern  . Not on file  Social History Narrative  . Not on file   Social Determinants of Health   Financial Resource Strain: Not on file  Food Insecurity: Not on file  Transportation Needs: Not on file  Physical Activity: Not on file  Stress: Not on file  Social Connections: Not on file  Intimate Partner Violence: Not on file    Family History  Problem Relation Age of Onset  . Heart attack Maternal Grandmother 50  . Prostate cancer Paternal Grandfather   .  Leukemia Paternal Grandfather   . Cancer - Prostate Paternal Grandfather   . Pancreatic cancer Other   . Cancer Neg Hx   . Diabetes Neg Hx   . Kidney disease Neg Hx   . Hypertension Neg Hx     The following portions of the patient's history were reviewed and updated as appropriate: allergies, current medications, past family history, past medical history, past social history, past surgical history and problem list.  Review of Systems Pertinent items noted in HPI and remainder of comprehensive ROS otherwise negative.   Objective:  BP 122/66   Pulse 65   Ht 5\' 5"  (1.651 m)   Wt 162 lb (73.5 kg)   LMP 08/10/2020 (Exact Date)   Breastfeeding No   BMI 26.96 kg/m  CONSTITUTIONAL: Well-developed, well-nourished female in no acute distress.  HENT:  Normocephalic, atraumatic, External right and left ear normal.  EYES: Conjunctivae and EOM are normal. Pupils are equal, round.  No scleral icterus.  NECK: Normal range of motion, supple, no masses.  Normal thyroid.  SKIN: Skin is warm and dry. No rash noted. Not diaphoretic. No erythema. No pallor. NEUROLOGIC: Alert and oriented to person, place, and time. Normal reflexes, muscle tone coordination. No cranial nerve deficit noted. PSYCHIATRIC: Normal mood and affect. Normal behavior. Normal judgment and thought content. CARDIOVASCULAR: Normal heart rate noted, regular rhythm RESPIRATORY: Clear to auscultation bilaterally. Effort and breath sounds normal, no problems with respiration noted. BREASTS: Symmetric in size. No masses, skin changes, nipple drainage, or lymphadenopathy. ABDOMEN: Soft, no distention noted.  No tenderness, rebound or guarding.  PELVIC: Normal appearing external genitalia; normal appearing vaginal mucosa and cervix.  No abnormal discharge noted.  Pap smear obtained. White discharge noted.  Normal uterine size, no other palpable masses, no uterine or adnexal tenderness. MUSCULOSKELETAL: Normal range of motion. No tenderness.   No cyanosis, clubbing, or edema.    Assessment and Plan:  1. Women's annual routine gynecological examination Nonsmoker Hx of migraines but no auras Has taken pills previously with no problems Advised Covid vaccine is recommended, but client had Covid in December 2021 and does not want to take the vaccine at this time. Patient requested pap today Has had problems with repeated infections of  BV and yeast - does not douche.   Treated last yeast infection with oral diflucan  - RPR - HIV Antibody (routine testing w rflx) - Hepatitis C Antibody - Hepatitis B Surface AntiGEN - Cytology - PAP( [facility]) - Cervicovaginal ancillary only( [place])  2.  Prescribed birth control pills  Will follow up results of pap smear and manage accordingly. Routine preventative health maintenance measures emphasized. Please refer to After Visit Summary for other counseling recommendations.    Nolene Bernheim, RN, MSN, NP-BC Nurse Practitioner, [facility] for [facility]   Addendum:  Labs show BV again.  Prescribed course of oral flagyl to treat current episode and then prescribed metrogel vaginal cream for use twice a week for 4 months for suppression.  Nolene Bernheim, RN, MSN, NP-BC Nurse Practitioner, [facility] for [employer], [facility] 09/05/2020 1:44 PM

## 2020-09-05 LAB — CERVICOVAGINAL ANCILLARY ONLY
Bacterial Vaginitis (gardnerella): POSITIVE — AB
Candida Glabrata: NEGATIVE
Candida Vaginitis: NEGATIVE
Comment: NEGATIVE
Comment: NEGATIVE
Comment: NEGATIVE

## 2020-09-05 LAB — RPR: RPR Ser Ql: NONREACTIVE

## 2020-09-05 LAB — HEPATITIS C ANTIBODY: Hep C Virus Ab: 0.1 s/co ratio (ref 0.0–0.9)

## 2020-09-05 LAB — HEPATITIS B SURFACE ANTIGEN: Hepatitis B Surface Ag: NEGATIVE

## 2020-09-05 LAB — HIV ANTIBODY (ROUTINE TESTING W REFLEX): HIV Screen 4th Generation wRfx: NONREACTIVE

## 2020-09-05 MED ORDER — METRONIDAZOLE 500 MG PO TABS: 500.0000 mg | ORAL_TABLET | Freq: Two times a day (BID) | ORAL | 0 refills | Status: DC

## 2020-09-05 MED ORDER — METRONIDAZOLE 0.75 % VA GEL: VAGINAL | 5 refills | Status: DC

## 2020-09-05 NOTE — Addendum Note (Signed)
Addended by: Currie Paris on: 09/05/2020 01:43 PM   Modules accepted: Orders

## 2020-09-06 LAB — CYTOLOGY - PAP
Chlamydia: NEGATIVE
Comment: NEGATIVE
Comment: NEGATIVE
Comment: NEGATIVE
Comment: NORMAL
Diagnosis: NEGATIVE
High risk HPV: NEGATIVE
Neisseria Gonorrhea: NEGATIVE
Trichomonas: NEGATIVE

## 2020-09-26 DIAGNOSIS — Z419 Encounter for procedure for purposes other than remedying health state, unspecified: Secondary | ICD-10-CM | POA: Diagnosis not present

## 2020-09-29 ENCOUNTER — Encounter: Payer: Self-pay | Admitting: Physician Assistant

## 2020-09-29 ENCOUNTER — Telehealth: Payer: Self-pay | Admitting: Lactation Services

## 2020-09-29 ENCOUNTER — Telehealth: Payer: Medicaid Other | Admitting: Emergency Medicine

## 2020-09-29 ENCOUNTER — Telehealth: Payer: Medicaid Other | Admitting: Physician Assistant

## 2020-09-29 DIAGNOSIS — Z30012 Encounter for prescription of emergency contraception: Secondary | ICD-10-CM | POA: Diagnosis not present

## 2020-09-29 DIAGNOSIS — Z309 Encounter for contraceptive management, unspecified: Secondary | ICD-10-CM

## 2020-09-29 MED ORDER — LEVONORGESTREL 1.5 MG PO TABS: 1.5000 mg | ORAL_TABLET | Freq: Once | ORAL | 0 refills | Status: AC

## 2020-09-29 MED ORDER — LEVONORGESTREL 1.5 MG PO TABS: 1.5000 mg | ORAL_TABLET | Freq: Once | ORAL | Status: DC

## 2020-09-29 NOTE — Progress Notes (Signed)
[name] are scheduled for a virtual visit with your provider today.    Just as we do with appointments in the office, we must obtain your consent to participate.  Your consent will be active for this visit and any virtual visit you may have with one of our providers in the next 365 days.    If you have a MyChart account, I can also send a copy of this consent to you electronically.  All virtual visits are billed to your insurance company just like a traditional visit in the office.  As this is a virtual visit, video technology does not allow for your provider to perform a traditional examination.  This may limit your provider's ability to fully assess your condition.  If your provider identifies any concerns that need to be evaluated in person or the need to arrange testing such as labs, EKG, etc, we will make arrangements to do so.    Although advances in technology are sophisticated, we cannot ensure that it will always work on either your end or our end.  If the connection with a video visit is poor, we may have to switch to a telephone visit.  With either a video or telephone visit, we are not always able to ensure that we have a secure connection.   I need to obtain your verbal consent now.   Are you willing to proceed with your visit today?   Latoya Palmer has provided verbal consent on 09/29/2020 for a virtual visit (video or telephone).  Piedad Climes, PA-C 09/29/2020  10:36 AM   Virtual Visit via Video   I connected with patient on 09/29/20 at 10:30 AM EST by a video enabled telemedicine application and verified that I am speaking with the correct person using two identifiers.  Location patient: Home Location provider: [facility] Persons participating in the virtual visit: Patient, Provider  I discussed the limitations of evaluation and management by telemedicine and the availability of in person appointments. The patient expressed understanding and  agreed to proceed.  Subjective:   HPI:   Patient presents via [facility] today requesting a prescription for Plan B emergency contraception so that it will be covered by her insurance as she cannot afford to purchase it OTC currently.  Patient states last night she was having intercourse with her female partner and the condom broke.  She has recently been on OCPs but was having substantial issue with bleeding on them.  As such she stopped her oral contraceptives about a week ago.  States she had bleeding up until a day or so after she stopped the contraceptive.  Has follow-up with GYN.  Notes she reached out to them this morning regarding need for prescription but has not heard back.  Submitted a E visit but was instructed medicine could not be prescribed through that type of visit.  States she just wants to be cautious since she just had her little one last year and does not want another pregnancy.  Has taken Plan B before in the past and tolerated well.  States she just needs prescription so the medication will be cheaper.  ROS:   See pertinent positives and negatives per HPI.  Patient Active Problem List   Diagnosis Date Noted  . Migraine without aura and responsive to treatment 09/04/2020  . Atopic dermatitis 01/07/2020  . CIN III (cervical intraepithelial neoplasia grade III) with severe dysplasia 04/21/2019    Social History   Tobacco Use  .  Smoking status: Former Smoker    Packs/day: 0.50    Years: 11.00    Pack years: 5.50    Types: Cigarettes    Quit date: 07/24/2018    Years since quitting: 2.1  . Smokeless tobacco: Never Used  Substance Use Topics  . Alcohol use: No    Current Outpatient Medications:  .  metroNIDAZOLE (FLAGYL) 500 MG tablet, Take 1 tablet (500 mg total) by mouth 2 (two) times daily. No alcohol while taking this medication, Disp: 14 tablet, Rfl: 0 .  metroNIDAZOLE (METROGEL VAGINAL) 0.75 % vaginal gel, Use oral metronidazole first and follow with vaginal  cream.  Use vaginally at bedtime twice a week for suppression of bacterial vaginosis for 4 months, Disp: 70 g, Rfl: 5 .  norgestrel-ethinyl estradiol (LO/OVRAL) 0.3-30 MG-MCG tablet, Take 1 tablet by mouth daily., Disp: 28 tablet, Rfl: 2  Allergies  Allergen Reactions  . Latex Swelling and Other (See Comments)    Causes irritation    Objective:   There were no vitals taken for this visit.  Patient is well-developed, well-nourished in no acute distress.  Resting comfortably at home.  Head is normocephalic, atraumatic.  No labored breathing.  Speech is clear and coherent with logical content.  Patient is alert and oriented at baseline.   Assessment and Plan:   1. Emergency contraceptive counseling and prescription After incident last night with condom breaking.  Does not desire another pregnancy.  Has taken before and tolerated well.  Just needing prescription for Plan B to help with cost.  Rx sent to pharmacy.  Patient to continue reaching out to her GYN to schedule follow-up to discuss other long-term birth control options for her since she did not tolerate her recent birth control.    Piedad Climes, [place] 09/29/2020

## 2020-09-29 NOTE — Progress Notes (Signed)
You will need to make an appointment with your OBGYN or with your PCP. Unfortunately, contraception isn't within in the scope of practice of e-visits.  Please contact your primary care physician practice to be seen. Many offices offer virtual options to be seen via video if you are not comfortable going in person to a medical facility at this time.  If you do not have a PCP, [facility] offers a free physician referral service available at [phone]. Our trained staff has the experience, knowledge and resources to put you in touch with a physician who is right for you.   You also have the option of a video visit through [email or web address]  If you are having a true medical emergency please call 911.  NOTE: If you entered your credit card information for this eVisit, you will not be charged. You may see a "hold" on your card for the $35 but that hold will drop off and you will not have a charge processed.  Your e-visit answers were reviewed by a board certified advanced clinical practitioner to complete your personal care plan.  Thank you for using e-Visits.  Approximately 5 minutes was used in reviewing the patient's chart, questionnaire, prescribing medications, and documentation.

## 2020-09-29 NOTE — Telephone Encounter (Signed)
Patient called and requested prescription for Plan B as she had a failure with her birth control last night.   Prescription sent to pharmacy. Patient called and notified via voicemail that prescription has been sent to her pharmacy.

## 2020-10-27 ENCOUNTER — Other Ambulatory Visit: Payer: Self-pay | Admitting: *Deleted

## 2020-10-27 DIAGNOSIS — Z419 Encounter for procedure for purposes other than remedying health state, unspecified: Secondary | ICD-10-CM | POA: Diagnosis not present

## 2020-10-27 IMAGING — US US MFM OB FOLLOW UP ADDL GEST
1 series · 12 of 28 positions shown · non-contrast
Comparison: none

[Series 1: us mfm ob follow up addl gest · 70 acquisitions, 12 frames shown]
[im 3/70]
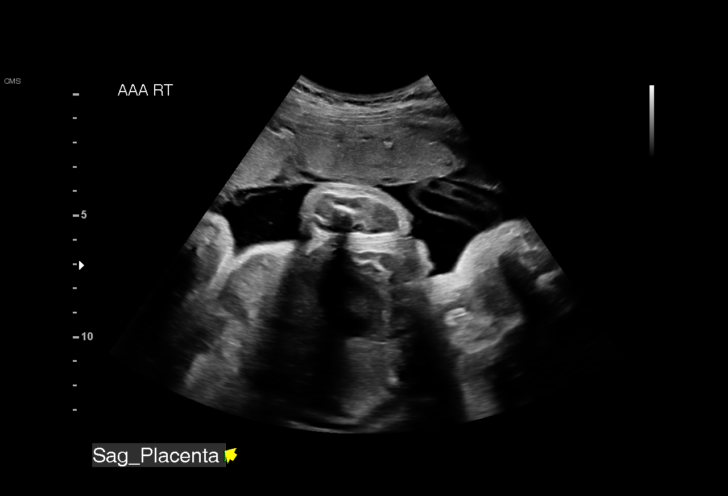
[im 8/70]
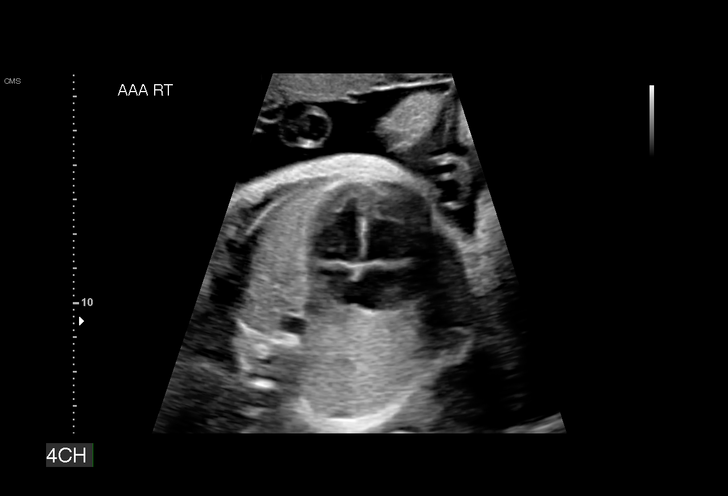
[im 13/70]
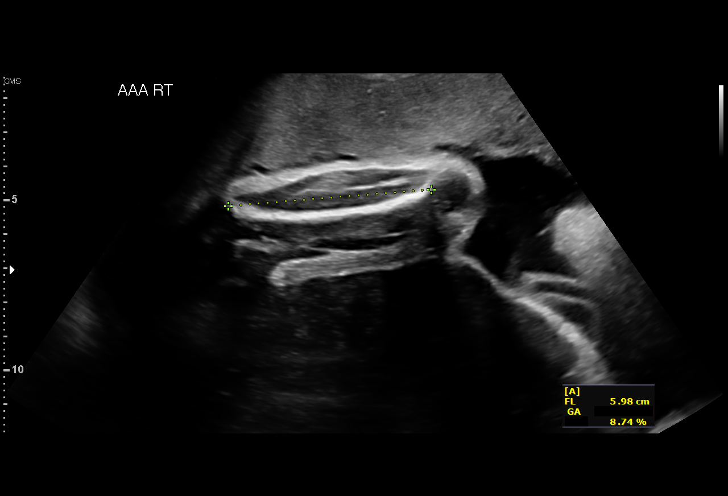
[im 21/70]
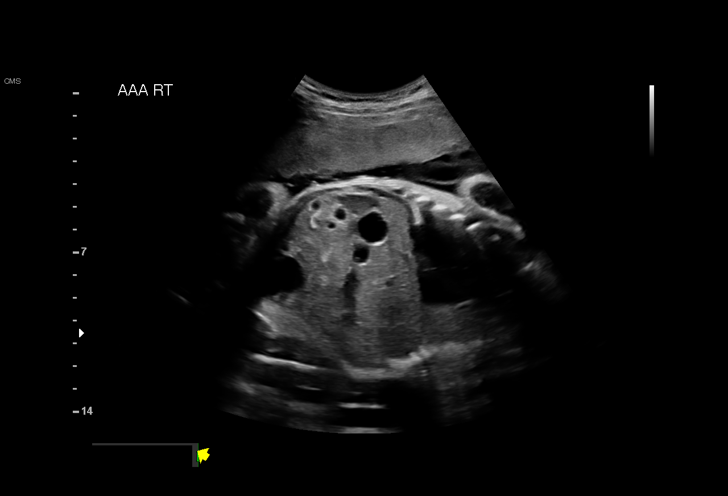
[im 26/70]
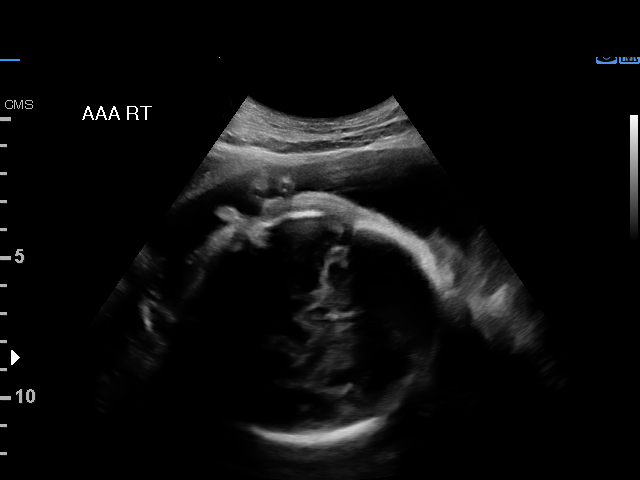
[im 31/70]
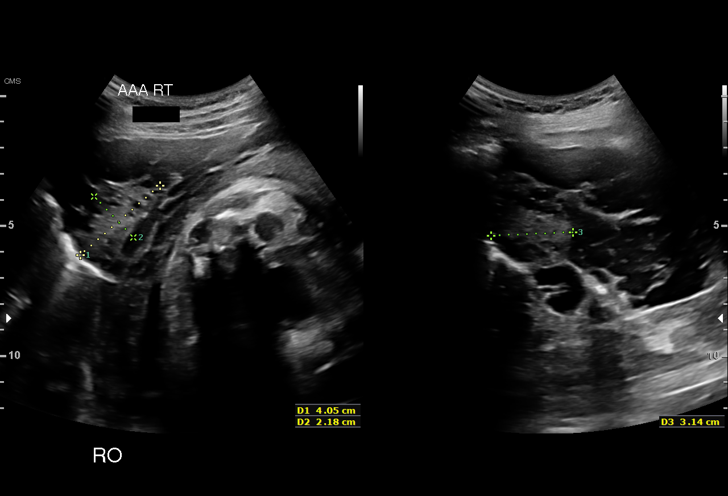
[im 39/70]
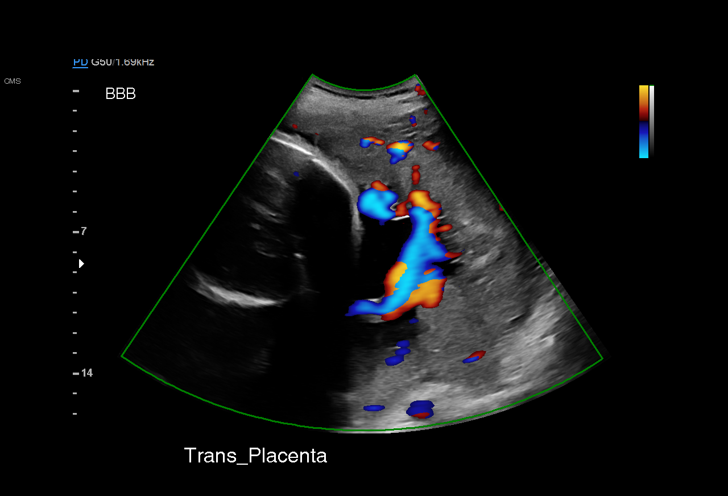
[im 44/70]
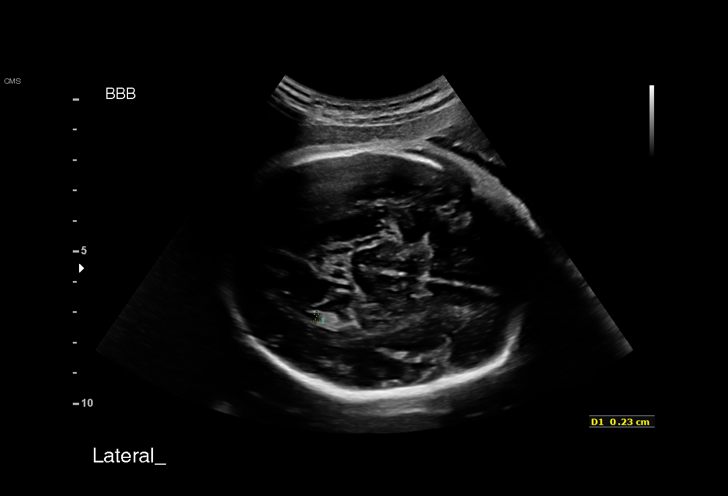
[im 49/70]
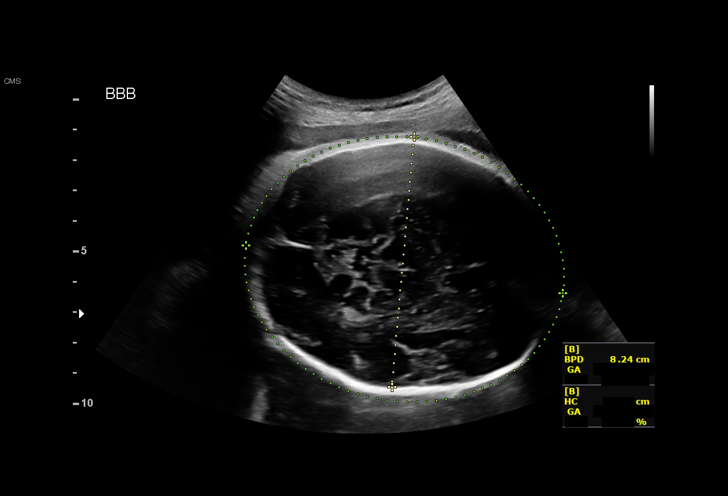
[im 57/70]
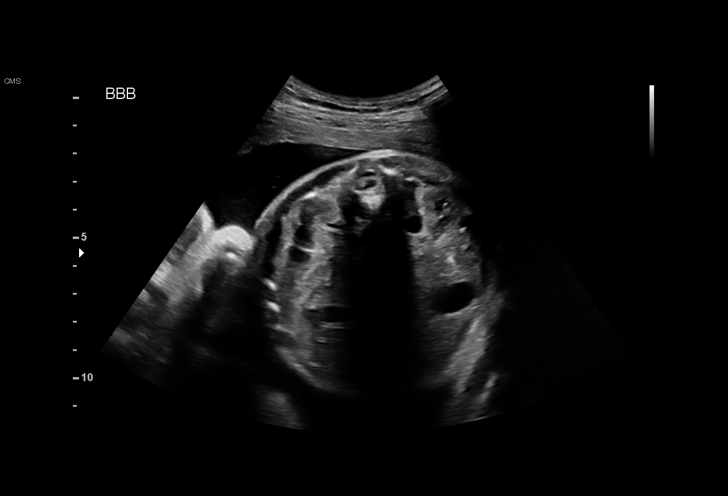
[im 62/70]
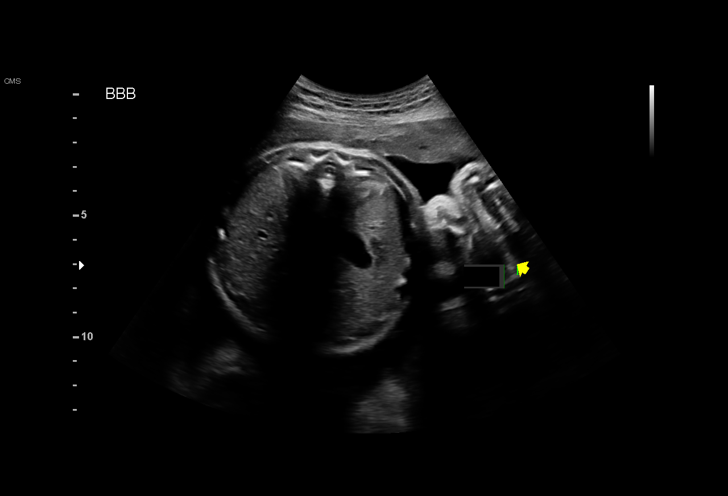
[im 67/70]
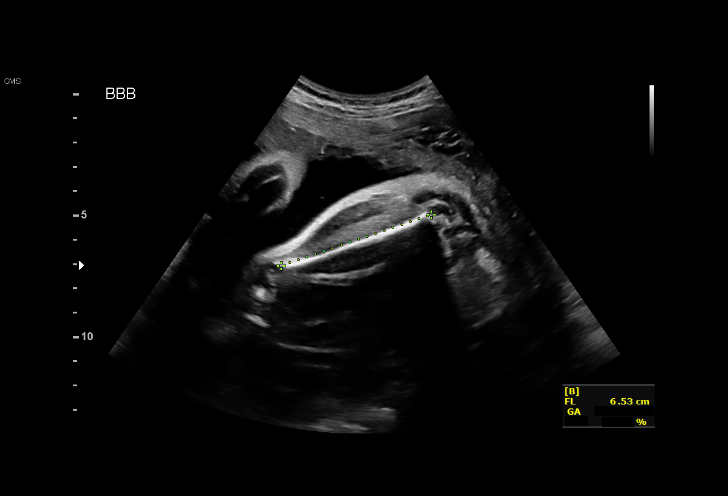

[12 of 28 positions shown; findings below may reference images not displayed]

BRUSSELEERS
     NOSA                                              BRUSSELEERS
 ----------------------------------------------------------------------

 ----------------------------------------------------------------------
Indications

  Encounter for other antenatal screening
  follow-up
  Twin pregnancy, di/di, third trimester (low
  risk NIPS)
  Previous cervical surgery (Cervical
  Conization with BX 7565)
  32 weeks gestation of pregnancy
 ----------------------------------------------------------------------
Fetal Evaluation (Fetus A)

 Num Of Fetuses:         2
 Fetal Heart Rate(bpm):  144
 Cardiac Activity:       Observed
 Fetal Lie:              Maternal right side
 Presentation:           Cephalic
 Placenta:               Anterior
 P. Cord Insertion:      Previously Visualized

 Amniotic Fluid
 AFI FV:      Within normal limits

                             Largest Pocket(cm)

Biometry (Fetus A)

 BPD:      82.4  mm     G. Age:  33w 1d         60  %    CI:        74.12   %    70 - 86
                                                         FL/HC:      19.8   %    19.9 -
 HC:      303.9  mm     G. Age:  33w 6d         45  %    HC/AC:      1.05        0.96 -
 AC:      290.1  mm     G. Age:  33w 0d         63  %    FL/BPD:     73.1   %    71 - 87
 FL:       60.2  mm     G. Age:  31w 2d         11  %    FL/AC:      20.8   %    20 - 24
 HUM:      55.4  mm     G. Age:  32w 2d         48  %

 Est. FW:    9255  gm      4 lb 7 oz     41  %     FW Discordancy         2  %
OB History

 Gravidity:    8         Term:   1         SAB:   3
 TOP:          3        Living:  1
Gestational Age (Fetus A)

 LMP:           34w 3d        Date:  06/10/18                 EDD:   03/17/19
 U/S Today:     32w 6d                                        EDD:   03/28/19
 Best:          32w 4d     Det. By:  Early Ultrasound         EDD:   03/30/19
                                     (08/04/18)
Anatomy (Fetus A)

 Cranium:               Appears normal         LVOT:                   Previously seen
 Cavum:                 Previously seen        Aortic Arch:            Previously seen
 Ventricles:            Previously seen        Ductal Arch:            Previously seen
 Choroid Plexus:        Previously seen        Diaphragm:              Previously seen
 Cerebellum:            Previously seen        Stomach:                Appears normal, left
                                                                       sided
 Posterior Fossa:       Previously seen        Abdomen:                Previously seen
 Nuchal Fold:           Previously seen        Abdominal Wall:         Previously seen
 Face:                  Orbits and profile     Cord Vessels:           Appears normal (3
                        previously seen                                vessel cord)
 Lips:                  Appears normal         Kidneys:                Appear normal
 Palate:                Previously seen        Bladder:                Appears normal
 Thoracic:              Appears normal         Spine:                  Previously seen
 Heart:                 Appears normal         Upper Extremities:      Previously seen
                        (4CH, axis, and
                        situs)
 RVOT:                  Previously seen        Lower Extremities:      Previously seen

 Other:  Heels visualized previously. Nasal bone visualized previously.

Fetal Evaluation (Fetus B)

 Num Of Fetuses:         2
 Fetal Heart Rate(bpm):  136
 Cardiac Activity:       Observed
 Fetal Lie:              Maternal left side
 Presentation:           Breech
 Placenta:               Posterior
 P. Cord Insertion:      Visualized

 Amniotic Fluid
 AFI FV:      Within normal limits

                             Largest Pocket(cm)

Biometry (Fetus B)

 BPD:      82.6  mm     G. Age:  33w 2d         62  %    CI:        74.18   %    70 - 86
                                                         FL/HC:      20.9   %    19.9 -
 HC:      304.5  mm     G. Age:  33w 6d         47  %    HC/AC:      1.07        0.96 -
 AC:       285   mm     G. Age:  32w 4d         48  %    FL/BPD:     77.1   %    71 - 87
 FL:       63.7  mm     G. Age:  32w 6d         47  %    FL/AC:      22.4   %    20 - 24
 HUM:      55.1  mm     G. Age:  32w 0d         45  %

 Est. FW:    9731  gm      4 lb 9 oz     48  %     FW Discordancy      0 \ 2 %
Gestational Age (Fetus B)

 LMP:           34w 3d        Date:  06/10/18                 EDD:   03/17/19
 U/S Today:     33w 1d                                        EDD:   03/26/19
 Best:          32w 4d     Det. By:  Early Ultrasound         EDD:   03/30/19
                                     (08/04/18)
Anatomy (Fetus B)

 Cranium:               Appears normal         Aortic Arch:            Previously seen
 Cavum:                 Appears normal         Ductal Arch:            Previously seen
 Ventricles:            Appears normal         Diaphragm:              Previously seen
 Choroid Plexus:        Appears normal         Stomach:                Appears normal, left
                                                                       sided
 Cerebellum:            Appears normal         Abdomen:                Previously seen
 Posterior Fossa:       Appears normal         Abdominal Wall:         Previously seen
 Nuchal Fold:           Previously seen        Cord Vessels:           Previously seen
 Face:                  Orbits and profile     Kidneys:                Appear normal
                        previously seen
 Lips:                  Previously seen        Bladder:                Appears normal
 Thoracic:              Appears normal         Spine:                  Previously seen
 Heart:                 Previously seen        Upper Extremities:      Previously seen
 RVOT:                  Previously seen        Lower Extremities:      Previously seen
 LVOT:                  Previously seen

 Other:  Heels and 5th digit visualized previously.
Cervix Uterus Adnexa

 Cervix
 Not visualized (advanced GA >44wks)

 Uterus
 No abnormality visualized.

 Left Ovary
 Within normal limits.

 Right Ovary
 Within normal limits.

 Cul De Sac
 No free fluid seen.

 Adnexa
 No abnormality visualized.
Impression

 Dichorionic-diamniotic twin pregnancy.
 Twin A: Maternal right, cephalic, anterior placenta. Amniotic
 fluid is normal and good fetal activity is seen. Fetal growth is
 appropriate for gestational age.

 Twin B: Maternal left, breech, posterior placenta. Amniotic
 fluid is normal and good fetal activity is seen. Fetal growth is
 appropriate for gestational age.

 Growth discordancy: 2% (normal).
Recommendations

 An appointment was made for her to return in 4 weeks for
 fetal growth assessment.
                 Forcella, Ta Yu

## 2020-10-27 MED ORDER — LEVONORGESTREL 1.5 MG PO TABS: 1.5000 mg | ORAL_TABLET | Freq: Once | ORAL | 0 refills | Status: AC

## 2020-10-27 NOTE — Progress Notes (Signed)
Pt called and requested Rx for Plan B. Rx approved by Dr. Alysia Penna and was e-prescribed. Pt notified.

## 2020-11-02 ENCOUNTER — Ambulatory Visit: Payer: Medicaid Other | Admitting: *Deleted

## 2020-11-02 ENCOUNTER — Other Ambulatory Visit: Payer: Self-pay

## 2020-11-02 VITALS — BP 137/92 | HR 63 | Ht 65.0 in | Wt 156.7 lb

## 2020-11-02 DIAGNOSIS — Z789 Other specified health status: Secondary | ICD-10-CM

## 2020-11-02 MED ORDER — CAYA VA DPRH: 1.0000 | VAGINAL_INSERT | VAGINAL | 2 refills | Status: DC | PRN

## 2020-11-02 NOTE — Progress Notes (Signed)
I spoke with patient, Latoya Palmer , today regarding her concerns. She was advised that she could have a diaphragm fitting at this appointment when she called recently but had been scheduled for a nurse visit. The patient voiced concerns about the communication and requested to see a provider for fitting today. There was only one provider in the office who does diaphragm fittings and his schedule was full today. Patient was made aware of an option for a universal fitting diaphragm to try and was agreeable to that option. She is aware that she can have an appointment as early as next week for a fitting if she would prefer, but opts to try this first. Rx was sent and patient was thankful for the solution.   Vonzella Nipple, PA-C 11/02/2020 10:35 AM

## 2020-11-02 NOTE — Progress Notes (Signed)
Appointment originally scheduled for bp check 2 months after starting ocp's. Patient has since stopped ocp's. States she told staff when she called she wanted to see someone about switching to diaphragm. I informed her I cannot fit her for diaphragm, will need to see provider and I will see if anyone can see her for that today. I checked with our 3 providers and 2 do not do that service, the other has full scheduled. I explained she will need to be rescheduled. She request to talk to our manager. I called and our manager will call her today or tomorrow. Patient requested to talk with director over all of our offices.I contacted Vonzella Nipple ,PA who will talk with her today. RX for diaphragm sent in per order.  Anddy Wingert,RN

## 2020-11-08 ENCOUNTER — Telehealth: Payer: Self-pay

## 2020-11-08 MED ORDER — LEVONORGESTREL 1.5 MG PO TABS: 1.5000 mg | ORAL_TABLET | Freq: Once | ORAL | 0 refills | Status: AC

## 2020-11-08 NOTE — Telephone Encounter (Signed)
Pt has called requesting to get an Rx for plan B because she has been having trouble finding someone who can fill her diaphragm prescription.  Plan B prescribed.  Pt informed that I would try to find where she can get her diaphram filled.  Pt verbalized understanding.   Leonette Nutting  11/08/20

## 2020-11-09 ENCOUNTER — Ambulatory Visit: Payer: Medicaid Other | Admitting: Family Medicine

## 2020-11-21 IMAGING — US US MFM OB FOLLOW UP ADDL GEST
1 series · 14 of 28 positions shown · non-contrast
Comparison: none

[Series 1: us mfm ob follow up addl gest · 70 acquisitions, 14 frames shown]
[im 3/70]
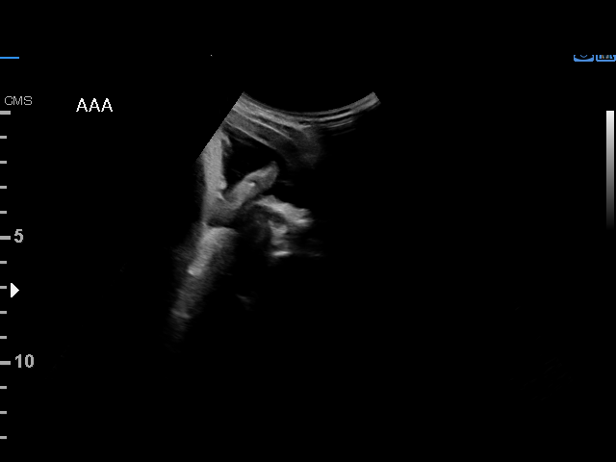
[im 8/70]
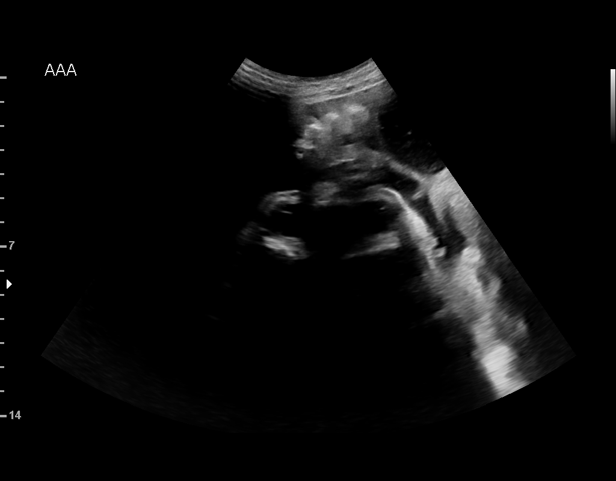
[im 13/70]
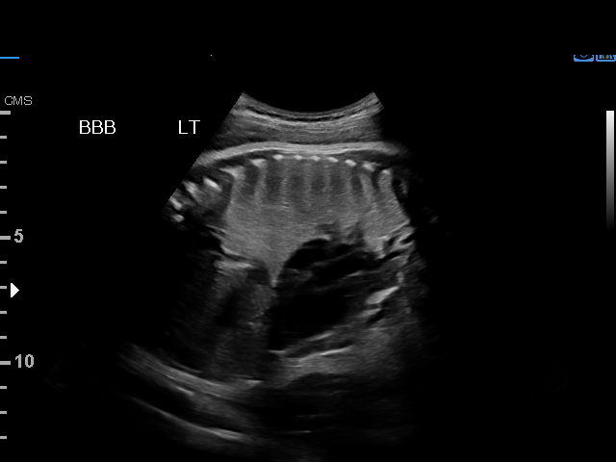
[im 18/70]
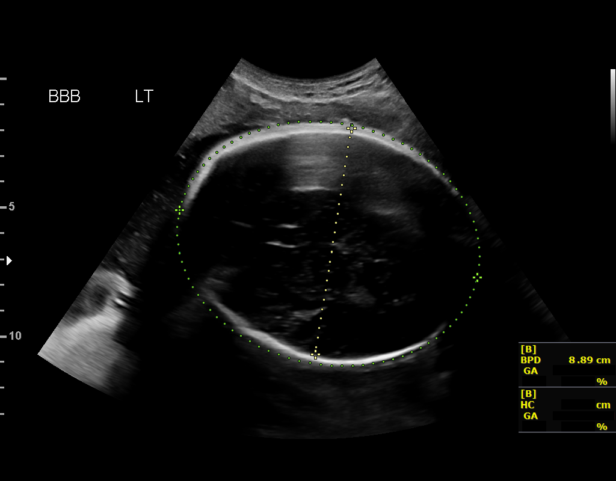
[im 24/70]
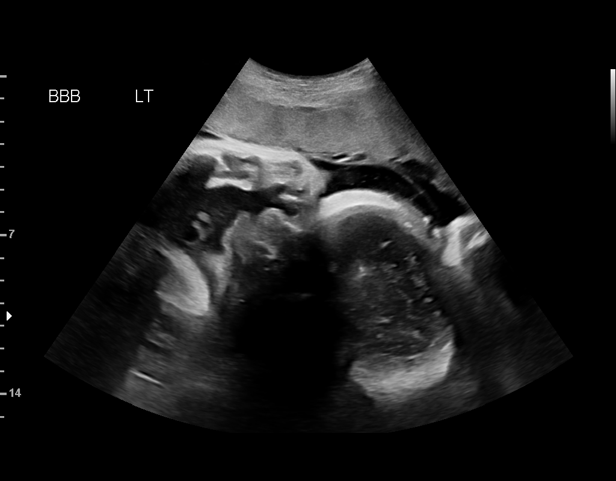
[im 29/70]
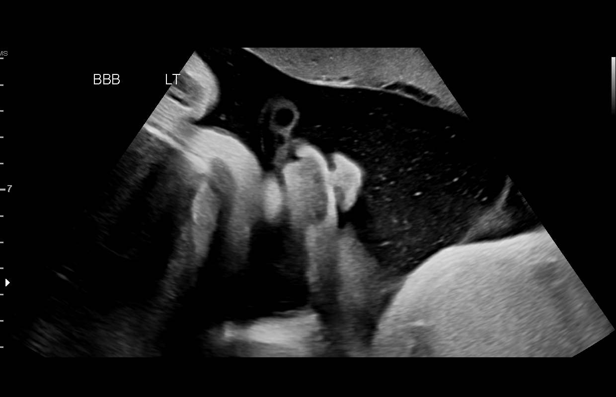
[im 34/70]
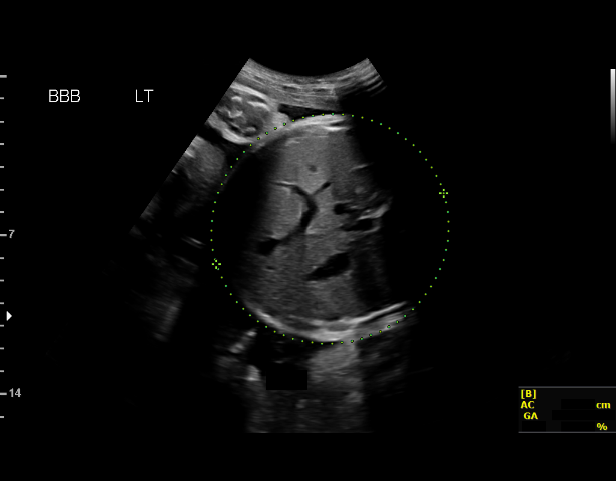
[im 39/70]
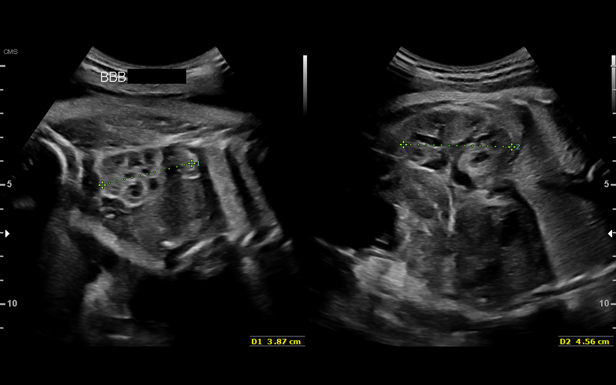
[im 44/70]
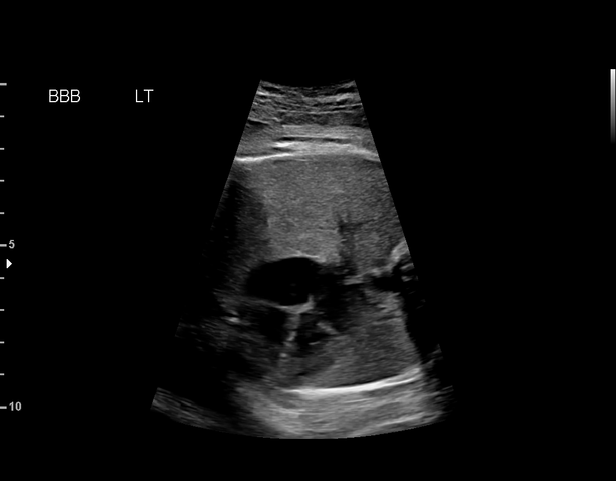
[im 49/70]
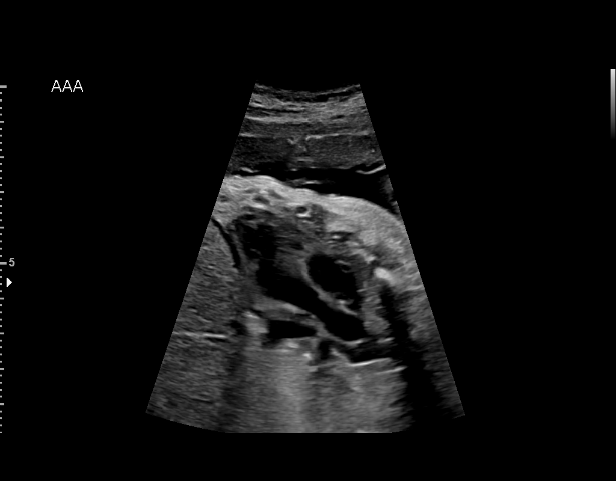
[im 54/70]
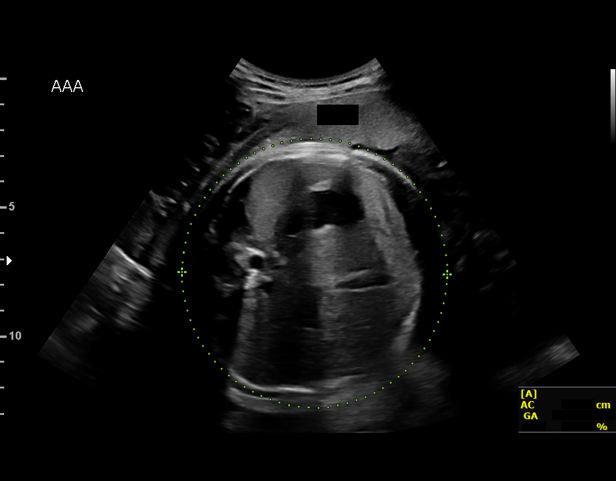
[im 59/70]
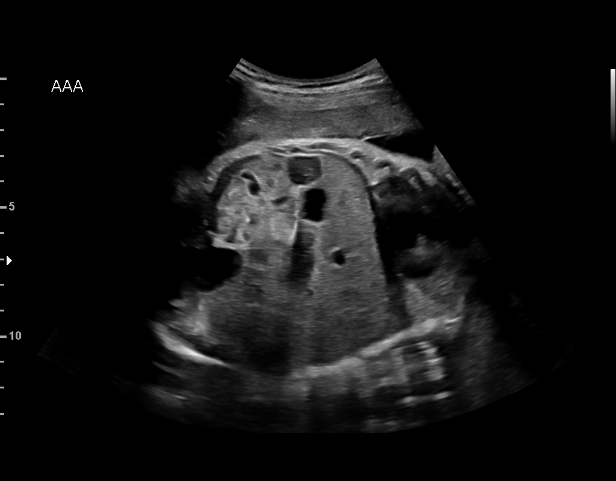
[im 64/70]
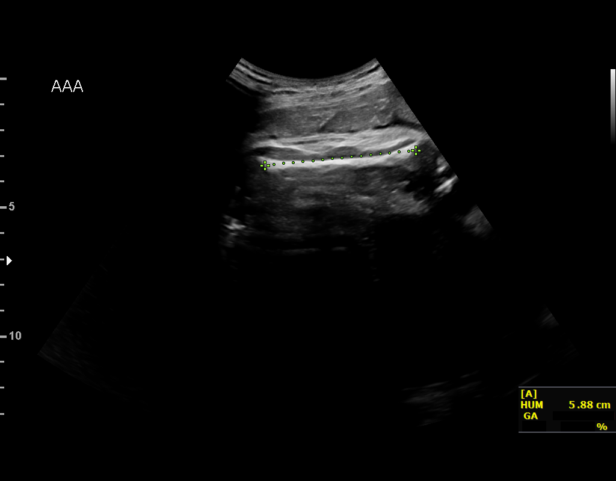
[im 70/70]
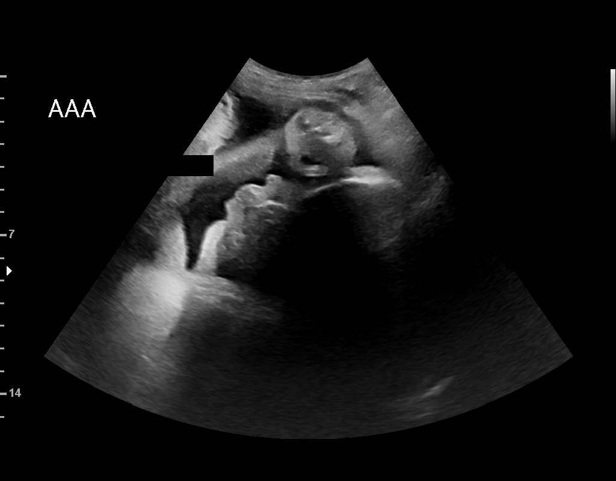

[14 of 28 positions shown; findings below may reference images not displayed]

GEST
     ADDL GESTATION
 ----------------------------------------------------------------------

 ----------------------------------------------------------------------
Indications

  Encounter for other antenatal screening
  follow-up
  Twin pregnancy, di/di, third trimester (low
  risk NIPS)
  Previous cervical surgery (Cervical
  Conization with BX 5756)
  36 weeks gestation of pregnancy
 ----------------------------------------------------------------------
Fetal Evaluation (Fetus A)

 Num Of Fetuses:          2
 Fetal Heart Rate(bpm):   158
 Cardiac Activity:        Observed
 Fetal Lie:               Maternal right side
 Presentation:            Cephalic
 Placenta:                Anterior
 P. Cord Insertion:       Previously Visualized
 Membrane Desc:      Dividing Membrane seen - Dichorionic.

 Amniotic Fluid
 AFI FV:      Within normal limits

                             Largest Pocket(cm)

Biophysical Evaluation (Fetus A)

 Amniotic F.V:   Within normal limits       F. Tone:         Observed
 F. Movement:    Observed                   Score:           [DATE]
 F. Breathing:   Observed
Biometry (Fetus A)

 BPD:      88.3  mm     G. Age:  35w 5d         46  %    CI:        72.86   %    70 - 86
                                                         FL/HC:       20.6  %    20.1 -
 HC:      328.9  mm     G. Age:  37w 3d         51  %    HC/AC:       1.02       0.93 -
 AC:      322.3  mm     G. Age:  36w 1d         61  %    FL/BPD:      76.6  %    71 - 87
 FL:       67.6  mm     G. Age:  34w 5d         14  %    FL/AC:       21.0  %    20 - 24
 HUM:      57.9  mm     G. Age:  33w 4d         20  %

 Est. FW:    8868   gm     6 lb 2 oz     44  %     FW Discordancy         2  %
OB History

 Gravidity:    8         Term:   1         SAB:   3
 TOP:          3        Living:  1
Gestational Age (Fetus A)

 LMP:           38w 0d        Date:  06/10/18                 EDD:   03/17/19
 U/S Today:     36w 0d                                        EDD:   03/31/19
 Best:          36w 1d     Det. By:  Early Ultrasound         EDD:   03/30/19
                                     (08/04/18)
Anatomy (Fetus A)

 Cranium:               Appears normal         LVOT:                   Appears normal
 Cavum:                 Previously seen        Aortic Arch:            Previously seen
 Ventricles:            Previously seen        Ductal Arch:            Previously seen
 Choroid Plexus:        Previously seen        Diaphragm:              Appears normal
 Cerebellum:            Previously seen        Stomach:                Appears normal, left
                                                                       sided
 Posterior Fossa:       Previously seen        Abdomen:                Appears normal
 Nuchal Fold:           Previously seen        Abdominal Wall:         Previously seen
 Face:                  Appears normal         Cord Vessels:           Appears normal (3
                        (orbits and profile)                           vessel cord)
 Lips:                  Appears normal         Kidneys:                Appear normal
 Palate:                Previously seen        Bladder:                Appears normal
 Thoracic:              Appears normal         Spine:                  Previously seen
 Heart:                 Appears normal         Upper Extremities:      Previously seen
                        (4CH, axis, and
                        situs)
 RVOT:                  Appears normal         Lower Extremities:      Previously seen

 Other:  Heels visualized previously. Nasal bone visualized previously.

Fetal Evaluation (Fetus B)

 Num Of Fetuses:          2
 Fetal Heart Rate(bpm):   129
 Cardiac Activity:        Observed
 Fetal Lie:               Maternal left side
 Presentation:            Cephalic
 Placenta:                Posterior
 P. Cord Insertion:       Previously Visualized
 Membrane Desc:      Dividing Membrane seen - Dichorionic.

 Amniotic Fluid
 AFI FV:      Within normal limits

                             Largest Pocket(cm)

Biophysical Evaluation (Fetus B)

 Amniotic F.V:   Within normal limits       F. Tone:         Observed
 F. Movement:    Observed                   Score:           [DATE]
 F. Breathing:   Observed
Biometry (Fetus B)

 BPD:      88.4  mm     G. Age:  35w 5d         48  %    CI:        70.41   %    70 - 86
                                                         FL/HC:       20.2  %    20.1 -
 HC:      335.9  mm     G. Age:  38w 3d         76  %    HC/AC:       1.04       0.93 -
 AC:      322.7  mm     G. Age:  36w 1d         62  %    FL/BPD:      76.8  %    71 - 87
 FL:       67.9  mm     G. Age:  34w 6d         17  %    FL/AC:       21.0  %    20 - 24
 HUM:      58.8  mm     G. Age:  34w 0d         28  %

 Est. FW:    0953   gm     6 lb 4 oz     49  %     FW Discordancy      0 \ 2 %
Gestational Age (Fetus B)

 LMP:           38w 0d        Date:  06/10/18                 EDD:   03/17/19
 U/S Today:     36w 2d                                        EDD:   03/29/19
 Best:          36w 1d     Det. By:  Early Ultrasound         EDD:   03/30/19
                                     (08/04/18)
Anatomy (Fetus B)

 Cranium:               Appears normal         Aortic Arch:            Previously seen
 Cavum:                 Appears normal         Ductal Arch:            Previously seen
 Ventricles:            Appears normal         Diaphragm:              Appears normal
 Choroid Plexus:        Previously seen        Stomach:                Appears normal, left
                                                                       sided
 Cerebellum:            Appears normal         Abdomen:                Appears normal
 Posterior Fossa:       Appears normal         Abdominal Wall:         Previously seen
 Nuchal Fold:           Previously seen        Cord Vessels:           Previously seen
 Face:                  Appears normal         Kidneys:                Appear normal
                        (orbits and profile)
 Lips:                  Appears normal         Bladder:                Appears normal
 Thoracic:              Appears normal         Spine:                  Previously seen
 Heart:                 Appears normal         Upper Extremities:      Previously seen
                        (4CH, axis, and
                        situs)
 RVOT:                  Appears normal         Lower Extremities:      Previously seen
 LVOT:                  Appears normal

 Other:  Heels and 5th digit visualized previously.
Cervix Uterus Adnexa

 Cervix
 Not visualized (advanced GA >38wks)
 Uterus
 No abnormality visualized.

 Left Ovary
 Not visualized.

 Right Ovary
 Not visualized.

 Cul De Sac
 No free fluid seen.

 Adnexa
 No abnormality visualized.
Impression

 Diamniotic Dichorionic normal interval twin growth
 Discordance within normal range
 Biophysical profile [DATE] for Twin A & B
Recommendations

 Repeat testing in 1 week.

## 2020-11-26 DIAGNOSIS — Z419 Encounter for procedure for purposes other than remedying health state, unspecified: Secondary | ICD-10-CM | POA: Diagnosis not present

## 2020-12-27 DIAGNOSIS — Z419 Encounter for procedure for purposes other than remedying health state, unspecified: Secondary | ICD-10-CM | POA: Diagnosis not present

## 2021-01-09 ENCOUNTER — Inpatient Hospital Stay (HOSPITAL_COMMUNITY): Payer: Medicaid Other

## 2021-01-09 ENCOUNTER — Other Ambulatory Visit: Payer: Self-pay

## 2021-01-09 ENCOUNTER — Encounter (HOSPITAL_COMMUNITY): Payer: Self-pay | Admitting: Obstetrics and Gynecology

## 2021-01-09 ENCOUNTER — Inpatient Hospital Stay (HOSPITAL_COMMUNITY)
Admission: AD | Admit: 2021-01-09 | Discharge: 2021-01-09 | Disposition: A | Discharge status: Home / Self Care | Payer: Medicaid Other | Attending: Obstetrics and Gynecology | Admitting: Obstetrics and Gynecology

## 2021-01-09 DIAGNOSIS — Z87891 Personal history of nicotine dependence: Secondary | ICD-10-CM | POA: Diagnosis not present

## 2021-01-09 DIAGNOSIS — R109 Unspecified abdominal pain: Secondary | ICD-10-CM | POA: Diagnosis not present

## 2021-01-09 DIAGNOSIS — Z3491 Encounter for supervision of normal pregnancy, unspecified, first trimester: Secondary | ICD-10-CM

## 2021-01-09 DIAGNOSIS — Z3A01 Less than 8 weeks gestation of pregnancy: Secondary | ICD-10-CM | POA: Insufficient documentation

## 2021-01-09 DIAGNOSIS — O26891 Other specified pregnancy related conditions, first trimester: Secondary | ICD-10-CM | POA: Insufficient documentation

## 2021-01-09 LAB — CBC
HCT: 31.8 % — ABNORMAL LOW (ref 36.0–46.0)
Hemoglobin: 10.2 g/dL — ABNORMAL LOW (ref 12.0–15.0)
MCH: 26 pg (ref 26.0–34.0)
MCHC: 32.1 g/dL (ref 30.0–36.0)
MCV: 81.1 fL (ref 80.0–100.0)
Platelets: 281 10*3/uL (ref 150–400)
RBC: 3.92 MIL/uL (ref 3.87–5.11)
RDW: 17.3 % — ABNORMAL HIGH (ref 11.5–15.5)
WBC: 6 10*3/uL (ref 4.0–10.5)
nRBC: 0 % (ref 0.0–0.2)

## 2021-01-09 LAB — URINALYSIS, ROUTINE W REFLEX MICROSCOPIC
Bilirubin Urine: NEGATIVE
Glucose, UA: NEGATIVE mg/dL
Ketones, ur: NEGATIVE mg/dL
Nitrite: NEGATIVE
Protein, ur: NEGATIVE mg/dL
Specific Gravity, Urine: 1.023 (ref 1.005–1.030)
pH: 5 (ref 5.0–8.0)

## 2021-01-09 LAB — WET PREP, GENITAL
Clue Cells Wet Prep HPF POC: NONE SEEN
Sperm: NONE SEEN
Trich, Wet Prep: NONE SEEN
Yeast Wet Prep HPF POC: NONE SEEN

## 2021-01-09 LAB — HCG, QUANTITATIVE, PREGNANCY: hCG, Beta Chain, Quant, S: 3497 m[IU]/mL — ABNORMAL HIGH (ref <5)

## 2021-01-09 NOTE — Discharge Instructions (Signed)
Return to care  If you have heavier bleeding that soaks through more that 2 pads per hour for an hour or more If you bleed so much that you feel like you might pass out or you do pass out If you have significant abdominal pain that is not improved with Tylenol   

## 2021-01-09 NOTE — MAU Note (Signed)
Started cramping about a wk ago.  Thought it was maybe implantation, but it is getting worse.  Lower abd.  Usually has pretty severe nausea with pregnancies and she hasn't had that. No bleeding.  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

## 2021-01-09 NOTE — MAU Provider Note (Addendum)
History     CSN: [id number]  Arrival date and time: 01/09/21 1326   None     Chief Complaint  Patient presents with   Abdominal Pain   Possible Pregnancy   HPI  [name] is a 35 year old [place] female [id number] currently 7 weeks 3 days gestation here today for abdominal pain that has gotten progressively worse over the last week. Sent here by her OB. Describes pain as lower abdomen bilateral cramping. Feels like period cramps. Comes and goes. 7.5/10 at its worst. When pain comes on, she stops whatever she's doing until the pain passes. Pain typically lasts less than 5 minutes. Happens about 5 times per day. No triggers that she's aware of. Patient took tylenol to try to help the pain 4 days ago with minimal relief. Denies vaginal bleeding and urinary complaints. States she has chronic BV and has milky white thick discharge. Uses metronidazole gel twice a week to manage chronic BV.   Plans to use [facility] for prenatal care.   OB History     Gravida  9   Para  2   Term  2   Preterm  0   AB  6   Living  3      SAB  3   IAB  3   Ectopic  0   Multiple  1   Live Births  3           Past Medical History:  Diagnosis Date   Abnormal Pap smear    Anemia    with pregnancy   Bacterial vaginosis    Headache(784.0)    hx - last one 09/2016 -otc prn   Heart murmur    hx - no problems; June - heart u/s "left chamber enlarged"   HGSIL (high grade squamous intraepithelial lesion) on Pap smear of cervix 07/19/2016   [ ]  Colposcopy and ECC   Miscarriage 09/17/2013   07/2013 - miscarriage - reeval BHCG to ensure resolved.    Ovarian cyst    Pneumonia 2018   Preterm labor    SVD (spontaneous vaginal delivery) 2008   x 1    Past Surgical History:  Procedure Laterality Date   CERVICAL CONIZATION W/BX N/A 11/05/2016   Procedure: CONIZATION CERVIX WITH BIOPSY - COLD KNIFE;  Surgeon: Allie BossierMyra C Dove, MD;  Location: [facility];  Service: Gynecology;   Laterality: N/A;   CESAREAN SECTION N/A 03/17/2019   Procedure: VAGINAL DELIVERY OF TWINS IN OPERATING ROOM;  Surgeon: Kathrynn RunningWouk, Noah Bedford, MD;  Location: [facility] LD [facility]S;  Service: Obstetrics;  Laterality: N/A;   COLPOSCOPY     MYRINGOTOMY     THERAPEUTIC ABORTION     two    Family History  Problem Relation Age of Onset   Heart attack Maternal Grandmother 2550   Prostate cancer Paternal Grandfather    Leukemia Paternal Grandfather    Cancer - Prostate Paternal Grandfather    Pancreatic cancer Other    Cancer Neg Hx    Diabetes Neg Hx    Kidney disease Neg Hx    Hypertension Neg Hx     Social History   Tobacco Use   Smoking status: Former    Packs/day: 0.50    Years: 11.00    Pack years: 5.50    Types: Cigarettes    Quit date: 07/24/2018    Years since quitting: 2.4   Smokeless tobacco: Never  Vaping Use   Vaping Use: Never used  Substance Use Topics   Alcohol use: No   Drug use: No    Allergies:  Allergies  Allergen Reactions   Latex Swelling and Other (See Comments)    Causes irritation    Facility-Administered Medications Prior to Admission  Medication Dose Route Frequency Provider Last Rate Last Admin   levonorgestrel (PLAN B 1-STEP) tablet 1.5 mg  1.5 mg Oral Once Reva Bores, MD       Medications Prior to Admission  Medication Sig Dispense Refill Last Dose   Diaphragm Arc-Spring (CAYA) DPRH Place 1 each vaginally as needed. 1 each 2    metroNIDAZOLE (FLAGYL) 500 MG tablet Take 1 tablet (500 mg total) by mouth 2 (two) times daily. No alcohol while taking this medication 14 tablet 0    metroNIDAZOLE (METROGEL VAGINAL) 0.75 % vaginal gel Use oral metronidazole first and follow with vaginal cream.  Use vaginally at bedtime twice a week for suppression of bacterial vaginosis for 4 months 70 g 5     Review of Systems  Constitutional:  Negative for chills, diaphoresis, fatigue and fever.  Respiratory:  Negative for cough, chest tightness and shortness of breath.    Cardiovascular:  Negative for chest pain and palpitations.  Gastrointestinal:  Positive for abdominal pain. Negative for diarrhea, nausea and vomiting.  Genitourinary:  Negative for dysuria, flank pain and frequency.  Musculoskeletal:  Negative for back pain.  Neurological:  Negative for weakness, light-headedness, numbness and headaches.  Physical Exam   Blood pressure 120/80, pulse 61, temperature 98.6 F (37 C), temperature source Oral, resp. rate 17, height 5\' 5"  (1.651 m), weight 72.1 kg, last menstrual period 11/18/2020, SpO2 100 %, not currently breastfeeding.  Physical Exam Constitutional:      General: She is not in acute distress.    Appearance: She is well-developed. She is not diaphoretic.  Cardiovascular:     Rate and Rhythm: Normal rate and regular rhythm.     Heart sounds: Normal heart sounds.  Pulmonary:     Effort: Pulmonary effort is normal. No respiratory distress.     Breath sounds: Normal breath sounds.  Chest:     Chest wall: No tenderness.  Abdominal:     General: Abdomen is flat and protuberant. Bowel sounds are normal. There is no distension.     Palpations: Abdomen is soft. There is no mass.     Tenderness: There is no abdominal tenderness.  Skin:    General: Skin is warm and dry.  Neurological:     General: No focal deficit present.     Mental Status: She is alert and oriented to person, place, and time.  Psychiatric:        Mood and Affect: Mood normal.        Behavior: Behavior normal.    MAU Course  Procedures Results for orders placed or performed during the hospital encounter of 01/09/21 (from the past 24 hour(s))  CBC     Status: Abnormal   Collection Time: 01/09/21  2:37 PM  Result Value Ref Range   WBC 6.0 4.0 - 10.5 K/uL   RBC 3.92 3.87 - 5.11 MIL/uL   Hemoglobin 10.2 (L) 12.0 - 15.0 g/dL   HCT 01/11/21 (L) 56.4 - 33.2 %   MCV 81.1 80.0 - 100.0 fL   MCH 26.0 26.0 - 34.0 pg   MCHC 32.1 30.0 - 36.0 g/dL   RDW 95.1 (H) 88.4 - 16.6 %    Platelets 281 150 - 400 K/uL   nRBC  0.0 0.0 - 0.2 %  hCG, quantitative, pregnancy     Status: Abnormal   Collection Time: 01/09/21  2:37 PM  Result Value Ref Range   hCG, Beta Chain, Quant, S 3,497 (H) <5 mIU/mL  Urinalysis, Routine w reflex microscopic     Status: Abnormal   Collection Time: 01/09/21  2:43 PM  Result Value Ref Range   Color, Urine YELLOW YELLOW   APPearance HAZY (A) CLEAR   Specific Gravity, Urine 1.023 1.005 - 1.030   pH 5.0 5.0 - 8.0   Glucose, UA NEGATIVE NEGATIVE mg/dL   Hgb urine dipstick SMALL (A) NEGATIVE   Bilirubin Urine NEGATIVE NEGATIVE   Ketones, ur NEGATIVE NEGATIVE mg/dL   Protein, ur NEGATIVE NEGATIVE mg/dL   Nitrite NEGATIVE NEGATIVE   Leukocytes,Ua SMALL (A) NEGATIVE   RBC / HPF 0-5 0 - 5 RBC/hpf   WBC, UA 0-5 0 - 5 WBC/hpf   Bacteria, UA RARE (A) NONE SEEN   Squamous Epithelial / LPF 6-10 0 - 5   Mucus PRESENT   Wet prep, genital     Status: Abnormal   Collection Time: 01/09/21  2:43 PM   Specimen: PATH Cytology Cervicovaginal Ancillary Only  Result Value Ref Range   Yeast Wet Prep HPF POC NONE SEEN NONE SEEN   Trich, Wet Prep NONE SEEN NONE SEEN   Clue Cells Wet Prep HPF POC NONE SEEN NONE SEEN   WBC, Wet Prep HPF POC MANY (A) NONE SEEN   Sperm NONE SEEN    US OB LESS THAN 14 WEEKS WITH OB TRANSVAGINAL  Result Date: 01/09/2021 CLINICAL DATA:  Cramps for 1 week EXAM: OBSTETRIC <14 WK [place] AND TRANSVAGINAL OB US TECHNIQUE: Both transabdominal and transvaginal ultrasound examinations were performed for complete evaluation of the gestation as well as the maternal uterus, adnexal regions, and pelvic cul-de-sac. Transvaginal technique was performed to assess early pregnancy. COMPARISON:  Ultrasound 08/04/2018 FINDINGS: Intrauterine gestational sac: Single Yolk sac:  Visualized. Embryo:  Not Visualized. Cardiac Activity: Not Visualized. Heart Rate: Not applicable MSD: 4.6 mm   5 w   1 d Subchorionic hemorrhage:  None visualized. Maternal  uterus/adnexae: Corpus luteum is seen in the right ovary. The left ovary is normal. Trace, likely physiologic simple free fluid. IMPRESSION: Single intrauterine gestational sac. Estimated gestational age [redacted] weeks 1 day. Recommend close clinical follow-up with OBGYN and follow-up ultrasound as clinically indicated. Electronically Signed   By: Caprice Renshaw   On: 01/09/2021 15:48     MDM Ectopic pregnancy ruled out via ultrasound and IUP established. US shows [redacted] week gestation pregnancy.  Patient instructed to have follow-up US in 1-1.5 weeks to monitor progression and growth of pregnancy. 7 weeks 3 days based off of LMP.   Assessment and Plan   1. Abdominal pain during pregnancy in first trimester   2. Normal IUP (intrauterine pregnancy) on prenatal ultrasound, first trimester   3. [redacted] weeks gestation of pregnancy      Patient instructed to follow-up with [facility] for prenatal care and will be scheduled for an ultrasound in 1-1.5 weeks to monitor growth of pregnancy. Discussed reasons to return to MAU for emergent care.   Thurston Pounds 01/09/2021, 2:06 PM      Attestation of Supervision of Student:  I confirm that I have verified the information documented in the nurse practitioner student's note and that I have also personally reperformed the history, physical exam and all medical decision making activities.  I have verified  that all services and findings are accurately documented in this student's note; and I agree with management and plan as outlined in the documentation. I have also made any necessary editorial changes.  HPI: Latoya Palmer is a 35 y.o. [phone] at [redacted]w[redacted]d who presents with abdominal cramping. Has history of irregular menses.  Reports intermittent abdominal cramping primarily in LLQ for the last few weeks. Nothing makes better or worse. Denies n/v/d, fever, dysuria, vaginal bleeding, or abnormal vaginal discharge. Plans on going to [facility] for prenatal care.    ROS: Review of Systems - Negative except abdominal cramping  Physical exam: BP 121/77   Pulse 61   Temp 98.6 F (37 C) (Oral)   Resp 17   Ht 5\' 5"  (1.651 m)   Wt 72.1 kg   LMP 11/18/2020   SpO2 100%   BMI 26.44 kg/m   Physical Examination: General appearance - alert, well appearing, and in no distress Mental status - normal mood, behavior, speech, dress, motor activity, and thought processes Eyes - sclera anicteric Chest - normal respiratory effort Abdomen - soft, nontender, nondistended, no masses or organomegaly Skin - warm & dry  MDM: +UPT UA, wet prep, GC/chlamydia, CBC, ABO/Rh, quant hCG, and 11/20/2020 today to rule out ectopic pregnancy which can be life threatening.   Ultrasound shows IUGS with yolk sac. Patient has irregular cycles - ultrasound has ruled out ectopic pregnancy but unsure of pregnancy viability vs incorrect dating. [facility]. Will get viability/dating scan in 7-10 days.   A/P: 1. Abdominal pain during pregnancy in first trimester   2. Normal IUP (intrauterine pregnancy) on prenatal ultrasound, first trimester   3. [redacted] weeks gestation of pregnancy    -GC/CT & urine culture pending -f/u outpatient ultrasound ordered for viability & dating -reviewed reasons to return to MAU    9-10, NP [facility], [facility] 01/09/2021 4:31 PM

## 2021-01-10 LAB — CULTURE, OB URINE
Culture: NO GROWTH
Special Requests: NORMAL

## 2021-01-10 LAB — GC/CHLAMYDIA PROBE AMP (~~LOC~~) NOT AT ARMC
Chlamydia: NEGATIVE
Comment: NEGATIVE
Comment: NORMAL
Neisseria Gonorrhea: NEGATIVE

## 2021-01-15 ENCOUNTER — Telehealth: Payer: Self-pay

## 2021-01-15 ENCOUNTER — Telehealth: Payer: Medicaid Other | Admitting: Physician Assistant

## 2021-01-15 DIAGNOSIS — O219 Vomiting of pregnancy, unspecified: Secondary | ICD-10-CM

## 2021-01-15 DIAGNOSIS — G43919 Migraine, unspecified, intractable, without status migrainosus: Secondary | ICD-10-CM

## 2021-01-15 MED ORDER — PROMETHAZINE HCL 25 MG PO TABS: 25.0000 mg | ORAL_TABLET | Freq: Four times a day (QID) | ORAL | 0 refills | Status: DC | PRN

## 2021-01-15 NOTE — Telephone Encounter (Signed)
Pt called requesting a refill on Phenergan for nausea.  Called pt and pt requested a refill on nausea medication.  I informed pt that per protocol I have e-prescribed the medication to her pharmacy.  Pt verbalized understanding.   Leonette Nutting  01/15/21

## 2021-01-15 NOTE — Progress Notes (Signed)
For the safety of you and your child, I recommend a face to face office visit with a health care provider. Giving > 48 hours of migraine with vomiting, etc and pregnancy status, you need evaluation either via video or in-person with your PCP or OB/GYN.   Many mothers need to take medicines during their pregnancy and while nursing.  Almost all medicines pass into the breast milk in small quantities.  Most are generally considered safe for a mother to take but some medicines must be avoided.  After reviewing your E-Visit request, I recommend that you consult your OB/GYN or pediatrician for medical advice in relation to your condition and prescription medications while pregnant or breastfeeding.  NOTE:  There will be NO CHARGE for this eVisit  If you are having a true medical emergency please call 911.    For an urgent face to face visit, [facility] has six urgent care centers for your convenience:     [facility] Health Urgent Care Center at [facility] Directions [phone] [address] [place], [place] [address]    [facility] Health Urgent Care Center [facility]) Get Driving Directions [phone] [address] [place], [place] [address]  [facility] Health Urgent Care Center [facility]) Get Driving Directions [phone] [address] [place],  [place]  [address]  [facility] Health Urgent Care at [facility] Get Driving Directions [phone] [address] [place], [place] [address]   [facility] Health Urgent Care at [facility] Get Driving Directions  [phone] [address] [place], [place] [address]   [facility] Health Urgent Care at [facility] Directions [phone] [address] [place], [place] [address]  Your MyChart E-visit questionnaire answers were reviewed by a board certified advanced clinical practitioner to complete your personal care plan based on your specific symptoms.   Thank you for using e-Visits.

## 2021-01-17 ENCOUNTER — Telehealth: Payer: Self-pay | Admitting: Lactation Services

## 2021-01-17 ENCOUNTER — Telehealth: Payer: Self-pay

## 2021-01-17 NOTE — Telephone Encounter (Signed)
Patient called in thinking she is needing a new ob appt and to also get an ultrasound to make sure the baby has a heart beat. Spoke with Jasmine December and she advised me to send to clinical pool to help this patient get schd and what she is needing done    Please advise if someone can help her get schd

## 2021-01-17 NOTE — Telephone Encounter (Signed)
Patient called in requesting when follow up US for fetal viability is scheduled. [place] has been ordered but not scheduled.   Called Radiology to schedule [place] at first available. Scheduled for June 28 9 am with MFM. Arrive at 8:45 with full bladder.   Called patient with appointment date, time, and location.   Patient reports she is having the same pain that she was experiencing pain , the same as she was a week ago. When told of [place] date and time patient said she will be going to MAU for evaluation. Patient then hung up the phone.

## 2021-01-23 ENCOUNTER — Other Ambulatory Visit: Payer: Self-pay

## 2021-01-23 ENCOUNTER — Other Ambulatory Visit: Payer: Self-pay | Admitting: Obstetrics and Gynecology

## 2021-01-23 ENCOUNTER — Ambulatory Visit: Admission: RE | Admit: 2021-01-23 | Payer: Medicaid Other | Source: Ambulatory Visit

## 2021-01-23 ENCOUNTER — Inpatient Hospital Stay (HOSPITAL_COMMUNITY)
Admission: AD | Admit: 2021-01-23 | Discharge: 2021-01-23 | Discharge status: Against Medical Advice | Payer: Medicaid Other | Attending: Obstetrics & Gynecology | Admitting: Obstetrics & Gynecology

## 2021-01-23 DIAGNOSIS — O26891 Other specified pregnancy related conditions, first trimester: Secondary | ICD-10-CM | POA: Insufficient documentation

## 2021-01-23 DIAGNOSIS — Z3A01 Less than 8 weeks gestation of pregnancy: Secondary | ICD-10-CM | POA: Insufficient documentation

## 2021-01-23 DIAGNOSIS — Z3480 Encounter for supervision of other normal pregnancy, unspecified trimester: Secondary | ICD-10-CM

## 2021-01-23 DIAGNOSIS — R109 Unspecified abdominal pain: Secondary | ICD-10-CM | POA: Diagnosis not present

## 2021-01-23 LAB — CBC
HCT: 32.7 % — ABNORMAL LOW (ref 36.0–46.0)
Hemoglobin: 10.7 g/dL — ABNORMAL LOW (ref 12.0–15.0)
MCH: 26.4 pg (ref 26.0–34.0)
MCHC: 32.7 g/dL (ref 30.0–36.0)
MCV: 80.7 fL (ref 80.0–100.0)
Platelets: 274 10*3/uL (ref 150–400)
RBC: 4.05 MIL/uL (ref 3.87–5.11)
RDW: 17.4 % — ABNORMAL HIGH (ref 11.5–15.5)
WBC: 6.7 10*3/uL (ref 4.0–10.5)
nRBC: 0 % (ref 0.0–0.2)

## 2021-01-23 LAB — HCG, QUANTITATIVE, PREGNANCY: hCG, Beta Chain, Quant, S: 54257 m[IU]/mL — ABNORMAL HIGH (ref <5)

## 2021-01-23 NOTE — MAU Provider Note (Signed)
None     S [name] is a 35 y.o. [phone] female at approximately [redacted] weeks gestation who presents to MAU today with complaint of missing her ultrasound appointment earlier today. Patient reports she is upset because no one told her she had an ultrasound today. She also reports she is unhappy because she was told that they weren't sure if she had a known pregnancy at her last MAU visit and that she 'just had a sac' in her uterus. She is concerned because she is having a lot of abdominal cramping. Denies vaginal bleeding or discharge.   O BP 117/63 (BP Location: Right Arm)   Pulse 70   Temp 98.3 F (36.8 C) (Oral)   Resp 16   LMP 11/18/2020   SpO2 100% Comment: room air VS reviewed, nursing note reviewed,  Constitutional: well developed, well nourished, no distress HEENT: normocephalic CV: normal rate Pulm: normal effort Abdomen: soft, NT Neuro: alert and oriented x 3 Skin: warm, dry Psych: affect normal  A&P Medical screening exam complete Reviewed patient chart - [place] on 6/14 shows intrauterine gestational sac, approx [redacted]w[redacted]d. MAU note from visit says normal IUP but needs follow up to confirm viability given how early Ultrasound was. Telephone encounter on 6/22 with [facility] RN shows that patient called to confirm date and time of her scheduled ultrasound.   I offered to patient that we could reschedule her ultrasound for a later date or that she could stay in MAU for further evaluation.   Patient insists that she did not know when her ultrasound was scheduled and is very upset about her care, demanded to speak with leadership.  After speaking with leadership, patient requests to discharge. Order placed and message sent to office to contact patient for [place] rescheduling, if patient desires.   Gita Kudo, MD 01/23/2021 3:55 PM

## 2021-01-23 NOTE — MAU Note (Signed)
Latoya Palmer is a 35 y.o. at [redacted]w[redacted]d here in MAU reporting: states she was seen here a week ago and was told she needed a follow up u/s. Never received a scheduling call. Is continuing to have pain. No bleeding or abnormal discharge.  Onset of complaint: ongoing  Pain score: 8/10  Vitals:   01/23/21 1403  BP: 117/63  Pulse: 70  Resp: 16  Temp: 98.3 F (36.8 C)  SpO2: 100%     Lab orders placed from triage: none

## 2021-01-23 NOTE — Progress Notes (Signed)
Called pt; pt states she has already rescheduled [place] for this Thursday.

## 2021-01-23 NOTE — MAU Note (Signed)
Pt signed out AMA. Dr Myriam Jacobson informed.

## 2021-01-23 NOTE — Progress Notes (Signed)
Ob us

## 2021-01-25 ENCOUNTER — Ambulatory Visit: Admit: 2021-01-25 | Payer: Medicaid Other

## 2021-01-25 ENCOUNTER — Ambulatory Visit
Admission: RE | Admit: 2021-01-25 | Discharge: 2021-01-25 | Disposition: A | Discharge status: Home / Self Care | Payer: Medicaid Other | Source: Ambulatory Visit | Attending: Student | Admitting: Student

## 2021-01-25 DIAGNOSIS — O26891 Other specified pregnancy related conditions, first trimester: Secondary | ICD-10-CM | POA: Diagnosis not present

## 2021-01-25 DIAGNOSIS — R109 Unspecified abdominal pain: Secondary | ICD-10-CM | POA: Insufficient documentation

## 2021-01-25 DIAGNOSIS — Z3A01 Less than 8 weeks gestation of pregnancy: Secondary | ICD-10-CM | POA: Diagnosis not present

## 2021-01-25 DIAGNOSIS — O3680X0 Pregnancy with inconclusive fetal viability, not applicable or unspecified: Secondary | ICD-10-CM | POA: Diagnosis not present

## 2021-01-25 DIAGNOSIS — N83201 Unspecified ovarian cyst, right side: Secondary | ICD-10-CM | POA: Diagnosis not present

## 2021-01-26 ENCOUNTER — Telehealth: Payer: Self-pay | Admitting: Medical

## 2021-01-26 DIAGNOSIS — O219 Vomiting of pregnancy, unspecified: Secondary | ICD-10-CM

## 2021-01-26 DIAGNOSIS — B9689 Other specified bacterial agents as the cause of diseases classified elsewhere: Secondary | ICD-10-CM

## 2021-01-26 DIAGNOSIS — Z419 Encounter for procedure for purposes other than remedying health state, unspecified: Secondary | ICD-10-CM | POA: Diagnosis not present

## 2021-01-26 MED ORDER — METRONIDAZOLE 500 MG PO TABS: 500.0000 mg | ORAL_TABLET | Freq: Two times a day (BID) | ORAL | 0 refills | Status: DC

## 2021-01-26 MED ORDER — PROMETHAZINE HCL 25 MG PO TABS: 25.0000 mg | ORAL_TABLET | Freq: Four times a day (QID) | ORAL | 0 refills | Status: DC | PRN

## 2021-01-26 NOTE — Telephone Encounter (Signed)
I called Latoya Palmer today at 10:16 AM and confirmed patient's identity using two patient identifiers. [place] results from yesterday were reviewed. Patient is not yet scheduled for new OB visit. Plans to return to [facility] and will call for appointment. First trimester warning signs and change in due date reviewed. Patient voiced understanding and requested a refill of Flagyl for BV symptoms and Phenergan for N/V. Rx sent to pharmacy of choice. Patient had no further questions.   US OB Transvaginal  Result Date: 01/25/2021 CLINICAL DATA:  Here for evaluation of pregnancy viability and dating. Gestational age by last menstrual period is 9 weeks and 5 days. EXAM: TRANSVAGINAL OB ULTRASOUND TECHNIQUE: Transvaginal ultrasound was performed for complete evaluation of the gestation as well as the maternal uterus, adnexal regions, and pelvic cul-de-sac. COMPARISON:  None. FINDINGS: Intrauterine gestational sac: Single Yolk sac:  Visualized. Embryo:  Visualized. Cardiac Activity: Visualized. Heart Rate: 148 bpm CRL:   11.0 mm   7 w 1 d                  [place] EDC: 09/12/2021 Subchorionic hemorrhage:  None visualized. Maternal uterus/adnexae: A cyst in the right ovary likely represents a corpus luteum cysts. The left ovary appears normal. IMPRESSION: Single live intrauterine pregnancy with estimated gestational age of [redacted] weeks 1 day. Electronically Signed   By: Romona Curls M.D.   On: 01/25/2021 15:27    Marny Lowenstein, PA-C 01/26/2021 10:16 AM

## 2021-01-30 ENCOUNTER — Encounter: Payer: Self-pay | Admitting: *Deleted

## 2021-02-26 DIAGNOSIS — Z419 Encounter for procedure for purposes other than remedying health state, unspecified: Secondary | ICD-10-CM | POA: Diagnosis not present

## 2021-03-29 DIAGNOSIS — Z419 Encounter for procedure for purposes other than remedying health state, unspecified: Secondary | ICD-10-CM | POA: Diagnosis not present

## 2021-03-30 ENCOUNTER — Telehealth: Payer: Self-pay | Admitting: Obstetrics & Gynecology

## 2021-03-30 DIAGNOSIS — B9689 Other specified bacterial agents as the cause of diseases classified elsewhere: Secondary | ICD-10-CM

## 2021-03-30 DIAGNOSIS — N760 Acute vaginitis: Secondary | ICD-10-CM

## 2021-03-30 MED ORDER — METRONIDAZOLE 500 MG PO TABS: 500.0000 mg | ORAL_TABLET | Freq: Two times a day (BID) | ORAL | 2 refills | Status: AC

## 2021-03-30 NOTE — Telephone Encounter (Signed)
Patient had called after hours line- returned phone call.  She notes long standing h/o recurrent BV.  Has tried all the conservative options and is currently being managed with Metrogel twice weekly to help suppress her symptoms.  Currently, she does not feel like this is working and notes a vaginal discharge with odor.  Rx for Flagyl sent in, advised follow up if no improvement or worsening of symptoms

## 2021-04-16 ENCOUNTER — Other Ambulatory Visit: Payer: Self-pay

## 2021-04-16 ENCOUNTER — Telehealth: Payer: Self-pay

## 2021-04-16 DIAGNOSIS — Z7251 High risk heterosexual behavior: Secondary | ICD-10-CM

## 2021-04-16 MED ORDER — LEVONORGESTREL 1.5 MG PO TABS: 1.5000 mg | ORAL_TABLET | Freq: Once | ORAL | 0 refills | Status: AC

## 2021-04-16 NOTE — Telephone Encounter (Signed)
Patient left VM on nurse line requesting prescription for Plan B. Pt reports recent unprotected intercourse. Reports using a condom that broke during intercourse. Called pt at 1225; unable to leave VM due to VM box being full.

## 2021-04-16 NOTE — Progress Notes (Signed)
Plan B ordered per Alvester Morin, MD due to recent unprotected intercourse early Sunday AM (04/15/21).  Fleet Contras RN

## 2021-04-28 ENCOUNTER — Telehealth: Payer: BC Managed Care – PPO | Admitting: Family

## 2021-04-28 DIAGNOSIS — Z419 Encounter for procedure for purposes other than remedying health state, unspecified: Secondary | ICD-10-CM | POA: Diagnosis not present

## 2021-04-28 DIAGNOSIS — Z3009 Encounter for other general counseling and advice on contraception: Secondary | ICD-10-CM | POA: Diagnosis not present

## 2021-04-28 MED ORDER — LEVONORGESTREL 1.5 MG PO TABS: 1.5000 mg | ORAL_TABLET | Freq: Once | ORAL | 0 refills | Status: AC

## 2021-04-28 NOTE — Progress Notes (Signed)
Based on what you shared with me, I feel your condition warrants further evaluation and I recommend that you be seen in a face to face visit.  Hello, you do not need a prescription for Plan B. You just go to your pharmacy and get.    NOTE: There will be NO CHARGE for this eVisit   If you are having a true medical emergency please call 911.      For an urgent face to face visit, [facility] has six urgent care centers for your convenience:     [facility] Health Urgent Care Center at [facility] Directions [phone] [address] [place], [place] [address]    [facility] Health Urgent Care Center [facility]) Get Driving Directions [phone] [address] [place], [place] [address]  [facility] Center [facility]) Get Driving Directions [phone] [address] [place],  [place]  [address]  [facility] Health Urgent Care at [facility] Get Driving Directions [phone] [address] [place], [place] [address]   [facility] Health Urgent Care at [facility] Get Driving Directions  [phone] [address] [place], [place] [address]   [facility] Health Urgent Care at [facility] Directions [phone] [address] [place], [place] [address]  Your MyChart E-visit questionnaire answers were reviewed by a board certified advanced clinical practitioner to complete your personal care plan based on your specific symptoms.  Thank you for using e-Visits.

## 2021-04-28 NOTE — Addendum Note (Signed)
Addended by: Jannifer Rodney A on: 04/28/2021 01:22 PM   Modules accepted: Orders

## 2021-04-28 NOTE — Progress Notes (Signed)
E-Visit for Vaginal Symptoms  We are sorry that you are not feeling well. Here is how we plan to help!  I have sent in levonorgestrel 1 tab. Start asap within 72 hours after unprotected intercourse.    Be sure to take all of the medication as directed. Stop taking any medication if you develop a rash, tongue swelling or shortness of breath. Mothers who are breast feeding should consider pumping and discarding their breast milk while on these antibiotics. However, there is no consensus that infant exposure at these doses would be harmful.  Remember that medication creams can weaken latex condoms. Marland Kitchen   HOME CARE:  Good hygiene may prevent some types of vaginosis from recurring and may relieve some symptoms:  Avoid baths, hot tubs and whirlpool spas. Rinse soap from your outer genital area after a shower, and dry the area well to prevent irritation. Don't use scented or harsh soaps, such as those with deodorant or antibacterial action. Avoid irritants. These include scented tampons and pads. Wipe from front to back after using the toilet. Doing so avoids spreading fecal bacteria to your vagina.  Other things that may help prevent vaginosis include:  Don't douche. Your vagina doesn't require cleansing other than normal bathing. Repetitive douching disrupts the normal organisms that reside in the vagina and can actually increase your risk of vaginal infection. Douching won't clear up a vaginal infection. Use a latex condom. Both female and female latex condoms may help you avoid infections spread by sexual contact. Wear cotton underwear. Also wear pantyhose with a cotton crotch. If you feel comfortable without it, skip wearing underwear to bed. Yeast thrives in [employer] Your symptoms should improve in the next day or two.  GET HELP RIGHT AWAY IF:  You have pain in your lower abdomen ( pelvic area or over your ovaries) You develop nausea or vomiting You develop a fever Your discharge  changes or worsens You have persistent pain with intercourse You develop shortness of breath, a rapid pulse, or you faint.  These symptoms could be signs of problems or infections that need to be evaluated by a medical provider now.  MAKE SURE YOU   Understand these instructions. Will watch your condition. Will get help right away if you are not doing well or get worse.  Thank you for choosing an e-visit.  Your e-visit answers were reviewed by a board certified advanced clinical practitioner to complete your personal care plan. Depending upon the condition, your plan could have included both over the counter or prescription medications.  Please review your pharmacy choice. Make sure the pharmacy is open so you can pick up prescription now. If there is a problem, you may contact your provider through [employer] and have the prescription routed to another pharmacy.  Your safety is important to [place]. If you have drug allergies check your prescription carefully.   For the next 24 hours you can use MyChart to ask questions about today's visit, request a non-urgent call back, or ask for a work or school excuse. You will get an email in the next two days asking about your experience. I hope that your e-visit has been valuable and will speed your recovery.  Approximately 5 minutes was spent documenting and reviewing patient's chart.

## 2021-05-29 DIAGNOSIS — Z419 Encounter for procedure for purposes other than remedying health state, unspecified: Secondary | ICD-10-CM | POA: Diagnosis not present

## 2021-06-28 ENCOUNTER — Other Ambulatory Visit: Payer: Self-pay

## 2021-06-28 ENCOUNTER — Encounter: Payer: Self-pay | Admitting: Obstetrics & Gynecology

## 2021-06-28 ENCOUNTER — Ambulatory Visit (INDEPENDENT_AMBULATORY_CARE_PROVIDER_SITE_OTHER): Payer: BC Managed Care – PPO | Admitting: Obstetrics & Gynecology

## 2021-06-28 VITALS — BP 126/87 | HR 66 | Ht 65.0 in | Wt 164.8 lb

## 2021-06-28 DIAGNOSIS — Z3009 Encounter for other general counseling and advice on contraception: Secondary | ICD-10-CM | POA: Diagnosis not present

## 2021-06-28 DIAGNOSIS — Z419 Encounter for procedure for purposes other than remedying health state, unspecified: Secondary | ICD-10-CM | POA: Diagnosis not present

## 2021-06-28 MED ORDER — PHEXXI 1.8-1-0.4 % VA GEL: VAGINAL | 11 refills | Status: DC

## 2021-06-28 MED ORDER — CAYA VA DPRH: VAGINAL_INSERT | VAGINAL | 0 refills | Status: DC

## 2021-06-28 MED ORDER — LEVONORGESTREL 1.5 MG PO TABS: 1.5000 mg | ORAL_TABLET | Freq: Once | ORAL | 0 refills | Status: AC

## 2021-06-28 NOTE — Patient Instructions (Signed)
Research laparoscopic tubal ligation with Filshie clips versus laparoscopic salpingectomy (removal of tubes)  Phexxi (lubricant) and Caya (diaphragm) prescribed.  Look for patient savings cards on both websites.

## 2021-06-28 NOTE — Progress Notes (Signed)
GYNECOLOGY OFFICE VISIT NOTE  History:   NAYOMI TABRON is a 35 y.o. [phone] here today for discussion about contraception.  She has used Depo Provera, OCPs, patch in the past but had bleeding issues. Wants to discuss nonhormonal methods, not interested in copper IUD though. She denies any current abnormal vaginal discharge, bleeding, pelvic pain or other concerns.    Past Medical History:  Diagnosis Date   Abnormal Pap smear    Anemia    with pregnancy   Headache(784.0)    hx - last one 09/2016 -otc prn   Heart murmur    hx - no problems; June - heart u/s "left chamber enlarged"   HGSIL (high grade squamous intraepithelial lesion) on Pap smear of cervix 07/19/2016   [ ]  Colposcopy and ECC   Miscarriage 09/17/2013   07/2013 - miscarriage - reeval BHCG to ensure resolved.    Ovarian cyst    Pneumonia 2018   Preterm labor     Past Surgical History:  Procedure Laterality Date   CERVICAL CONIZATION W/BX N/A 11/05/2016   Procedure: CONIZATION CERVIX WITH BIOPSY - COLD KNIFE;  Surgeon: 01/05/2017, MD;  Location: [facility];  Service: Gynecology;  Laterality: N/A;   CESAREAN SECTION N/A 03/17/2019   Procedure: VAGINAL DELIVERY OF TWINS IN OPERATING ROOM;  Surgeon: 03/19/2019, MD;  Location: [facility] LD [facility]S;  Service: Obstetrics;  Laterality: N/A;   COLPOSCOPY     MYRINGOTOMY     THERAPEUTIC ABORTION     two    The following portions of the patient's history were reviewed and updated as appropriate: allergies, current medications, past family history, past medical history, past social history, past surgical history and problem list.   Health Maintenance:  Normal pap and negative HRHPV on 09/04/2020.   Review of Systems:  Pertinent items noted in HPI and remainder of comprehensive ROS otherwise negative.  Physical Exam:  BP 126/87   Pulse 66   Ht 5\' 5"  (1.651 m)   Wt 164 lb 12.8 oz (74.8 kg)   LMP 06/11/2021 (Exact Date)   Breastfeeding No   BMI 27.42 kg/m  CONSTITUTIONAL:  Well-developed, well-nourished female in no acute distress.  HEENT:  Normocephalic, atraumatic. External right and left ear normal. No scleral icterus.  NECK: Normal range of motion, supple, no masses noted on observation SKIN: No rash noted. Not diaphoretic. No erythema. No pallor. MUSCULOSKELETAL: Normal range of motion. No edema noted. NEUROLOGIC: Alert and oriented to person, place, and time. Normal muscle tone coordination. No cranial nerve deficit noted. PSYCHIATRIC: Normal mood and affect. Normal behavior. Normal judgment and thought content. CARDIOVASCULAR: Normal heart rate noted RESPIRATORY: Effort and breath sounds normal, no problems with respiration noted ABDOMEN: No masses noted. No other overt distention noted.   PELVIC: Deferred     Assessment and Plan:    1. General counseling and advice on female contraception Discussed , fertility awareness methods, condoms, diaphragm, non-hormonal lubricant contraception, sterilization. Talked about efficacy of these methods.  All questions answered. Prescribed Caya and Phexxi for now. She is thinking about tubal ligation. Information given to her to review. She also wanted Plan B prescribed, had unprotected intercourse yesterday. Was told to take pregnancy test if she missed her period.  - Diaphragm Arc-Spring (CAYA) DPRH; Please place vaginally as instructed  Dispense: 1 each; Refill: 0 - Lactic Ac-Citric Ac-Pot Bitart (PHEXXI) 1.8-1-0.4 % GEL; Administer 1 applicator (5 g) intravaginally immediately before or up to 1 hour before [facility] act  of vaginal intercourse as needed.  Dispense: 60 g; Refill: 11 - levonorgestrel (PLAN B 1-STEP) 1.5 MG tablet; Take 1 tablet (1.5 mg total) by mouth once for 1 dose.  Dispense: 1 tablet; Refill: 0  Routine preventative health maintenance measures emphasized. Please refer to After Visit Summary for other counseling recommendations.   Return for any gynecologic concerns.    I spent 20 minutes dedicated to  the care of this patient including pre-visit review of records, face to face time with the patient discussing her conditions and treatments and post visit orders.    Jaynie Collins, MD, FACOG Obstetrician & Gynecologist, [facility] for [employer], [facility]

## 2021-07-19 ENCOUNTER — Telehealth: Payer: Self-pay

## 2021-07-19 NOTE — Telephone Encounter (Signed)
PA sent to [facility] for Phexxi gel. Called pt to notify, VM left. MyChart message sent.

## 2021-07-24 ENCOUNTER — Encounter: Payer: Self-pay | Admitting: Lactation Services

## 2021-07-24 ENCOUNTER — Telehealth: Payer: Self-pay | Admitting: Lactation Services

## 2021-07-24 NOTE — Telephone Encounter (Signed)
Patient called and LM on voicemail that she would like to know if her PA has been approved. She reports she has called the Pharmacy and it has not been approved. Per Maxwell [place], it has been approved.   Called [facility] to let them know PA has been approved and needs to be rerun. On hold for over 30 minutes and not able to speak with pharmacy. Will need to try again tomorrow.   My Chart message sent to patient.

## 2021-07-25 ENCOUNTER — Telehealth: Payer: Self-pay | Admitting: Lactation Services

## 2021-07-25 ENCOUNTER — Other Ambulatory Visit: Payer: Self-pay | Admitting: Lactation Services

## 2021-07-25 ENCOUNTER — Encounter: Payer: Self-pay | Admitting: Lactation Services

## 2021-07-25 MED ORDER — PHEXXI 1.8-1-0.4 % VA GEL: 5.0000 g | VAGINAL | 11 refills | Status: DC

## 2021-07-25 NOTE — Telephone Encounter (Signed)
Pharmacy in [place] no longer fills Phexxi prescriptions.   Called [facility] and they can order it but will not apply insurance and would cost patient $400.   Called [facility] and they can order the medication. They cannot tell how much it will cost patient at this time. They were informed PA has been approved. Prescription sent to them and asked them to call the patient before filling.   My Chart Message sent to patient with above information.

## 2021-07-25 NOTE — Telephone Encounter (Signed)
Called Pharmacy and spoke with pharmacy to request Phexxi be filled as PA has been approved. They report they are not filling the medication anymore and will need to try another pharmacy.   Called patient to ask what pharmacy she was planning to get the medication filled at. She did not answer. LM for her to call the office at her convenience to discuss or check My Chart Message.

## 2021-07-29 DIAGNOSIS — Z419 Encounter for procedure for purposes other than remedying health state, unspecified: Secondary | ICD-10-CM | POA: Diagnosis not present

## 2021-08-02 ENCOUNTER — Other Ambulatory Visit: Payer: Self-pay | Admitting: Obstetrics & Gynecology

## 2021-08-29 DIAGNOSIS — Z419 Encounter for procedure for purposes other than remedying health state, unspecified: Secondary | ICD-10-CM | POA: Diagnosis not present

## 2021-09-26 DIAGNOSIS — Z419 Encounter for procedure for purposes other than remedying health state, unspecified: Secondary | ICD-10-CM | POA: Diagnosis not present

## 2021-10-01 ENCOUNTER — Encounter (HOSPITAL_COMMUNITY): Payer: Self-pay | Admitting: Family Medicine

## 2021-10-01 ENCOUNTER — Inpatient Hospital Stay (HOSPITAL_COMMUNITY)
Admission: AD | Admit: 2021-10-01 | Discharge: 2021-10-01 | Disposition: A | Discharge status: Home / Self Care | Payer: BC Managed Care – PPO | Attending: Family Medicine | Admitting: Family Medicine

## 2021-10-01 ENCOUNTER — Other Ambulatory Visit: Payer: Self-pay

## 2021-10-01 DIAGNOSIS — Z3202 Encounter for pregnancy test, result negative: Secondary | ICD-10-CM | POA: Diagnosis not present

## 2021-10-01 DIAGNOSIS — L810 Postinflammatory hyperpigmentation: Secondary | ICD-10-CM | POA: Insufficient documentation

## 2021-10-01 LAB — POCT PREGNANCY, URINE: Preg Test, Ur: NEGATIVE

## 2021-10-01 NOTE — MAU Note (Signed)
Latoya Palmer is a 36 y.o. in MAU reporting: Lower abdominal cramping and VB.  Reports VB began on Friday, was heavy but now is spotting with wiping. ?LMP: 08/24/2021 ?Onset of complaint:  Friday, March 3 ?Pain score: 4 ?Vitals:  ? 10/01/21 1401  ?BP: 123/90  ?Pulse: 61  ?Resp: 19  ?Temp: 98.2 ?F (36.8 ?C)  ?SpO2: 100%  ?   ? ?Lab orders placed from triage:   UPT ?

## 2021-10-01 NOTE — MAU Provider Note (Signed)
Event Date/Time  ? First Provider Initiated Contact with Patient 10/01/21 1429   ?  ? ?S ?Latoya Palmer is a 36 y.o. [phone] patient who presents to MAU today with complaint of vaginal bleeding. She reports having 2 positive home pregnancy tests on 2/24. States she started bleeding on Friday. As of today her bleeding has resolved but continues to have abdominal cramping.  ? ?O ?BP 123/90 (BP Location: Right Arm)   Pulse 61   Temp 98.2 ?F (36.8 ?C) (Oral)   Resp 19   Ht 5\' 5"  (1.651 m)   Wt 74.5 kg   LMP 08/24/2021   SpO2 100%   BMI 27.32 kg/m?  ?Physical Exam ?Vitals and nursing note reviewed.  ?Constitutional:   ?   General: She is not in acute distress. ?   Appearance: She is well-developed.  ?HENT:  ?   Head: Normocephalic and atraumatic.  ?Pulmonary:  ?   Effort: Pulmonary effort is normal. No respiratory distress.  ?Neurological:  ?   Mental Status: She is alert.  ?Psychiatric:     ?   Mood and Affect: Mood normal.     ?   Behavior: Behavior normal.  ? ? ?A ?Medical screening exam complete ?1. Negative pregnancy test   ?-Offered HCG since patient had positive HPTs. Patient declined. Per patient, she will follow up with her OB/gyn ? ? ?P ?Discharge from MAU in stable condition ?Bleeding/infection precautions ? ?08/26/2021, NP ?10/01/2021 3:08 PM  ? ?

## 2021-10-01 NOTE — Discharge Instructions (Signed)
Return to care  If you have heavier bleeding that soaks through more than 2 pads per hour for an hour or more If you bleed so much that you feel like you might pass out or you do pass out If you have significant abdominal pain that is not improved with Tylenol   

## 2021-10-15 DIAGNOSIS — F4320 Adjustment disorder, unspecified: Secondary | ICD-10-CM | POA: Diagnosis not present

## 2021-10-27 DIAGNOSIS — Z419 Encounter for procedure for purposes other than remedying health state, unspecified: Secondary | ICD-10-CM | POA: Diagnosis not present

## 2021-11-12 ENCOUNTER — Encounter: Payer: Self-pay | Admitting: *Deleted

## 2021-11-26 DIAGNOSIS — Z419 Encounter for procedure for purposes other than remedying health state, unspecified: Secondary | ICD-10-CM | POA: Diagnosis not present

## 2021-12-27 DIAGNOSIS — Z419 Encounter for procedure for purposes other than remedying health state, unspecified: Secondary | ICD-10-CM | POA: Diagnosis not present

## 2022-01-07 ENCOUNTER — Telehealth: Payer: Self-pay

## 2022-01-07 DIAGNOSIS — B9689 Other specified bacterial agents as the cause of diseases classified elsewhere: Secondary | ICD-10-CM

## 2022-01-07 NOTE — Telephone Encounter (Signed)
Patient requests refill for Flagyl prescription for chronic BV.

## 2022-01-08 MED ORDER — METRONIDAZOLE 500 MG PO TABS: 500.0000 mg | ORAL_TABLET | Freq: Two times a day (BID) | ORAL | 0 refills | Status: DC

## 2022-01-08 NOTE — Telephone Encounter (Signed)
Called patient who states she is having her usual symptoms of BV, discharge with odor, & would like a Rx. Rx sent to pharmacy per protocol & patient informed. Patient verbalized understanding.

## 2022-01-08 NOTE — Addendum Note (Signed)
Addended by: Shelly Coss on: 01/08/2022 04:36 PM   Modules accepted: Orders

## 2022-01-26 DIAGNOSIS — Z419 Encounter for procedure for purposes other than remedying health state, unspecified: Secondary | ICD-10-CM | POA: Diagnosis not present

## 2022-02-03 ENCOUNTER — Telehealth: Payer: BC Managed Care – PPO | Admitting: Physician Assistant

## 2022-02-03 DIAGNOSIS — R3989 Other symptoms and signs involving the genitourinary system: Secondary | ICD-10-CM | POA: Diagnosis not present

## 2022-02-04 MED ORDER — CEPHALEXIN 500 MG PO CAPS: 500.0000 mg | ORAL_CAPSULE | Freq: Two times a day (BID) | ORAL | 0 refills | Status: DC

## 2022-02-04 NOTE — Progress Notes (Signed)

## 2022-02-26 DIAGNOSIS — Z419 Encounter for procedure for purposes other than remedying health state, unspecified: Secondary | ICD-10-CM | POA: Diagnosis not present

## 2022-03-05 ENCOUNTER — Telehealth: Payer: BC Managed Care – PPO | Admitting: Physician Assistant

## 2022-03-05 DIAGNOSIS — R3989 Other symptoms and signs involving the genitourinary system: Secondary | ICD-10-CM

## 2022-03-06 ENCOUNTER — Telehealth: Payer: BC Managed Care – PPO | Admitting: Physician Assistant

## 2022-03-06 DIAGNOSIS — R3989 Other symptoms and signs involving the genitourinary system: Secondary | ICD-10-CM | POA: Diagnosis not present

## 2022-03-06 MED ORDER — SULFAMETHOXAZOLE-TRIMETHOPRIM 800-160 MG PO TABS: 1.0000 | ORAL_TABLET | Freq: Two times a day (BID) | ORAL | 0 refills | Status: AC

## 2022-03-06 NOTE — Progress Notes (Signed)

## 2022-03-06 NOTE — Progress Notes (Signed)
Because you have had a recent UTI within the last month and have had recurrent symptoms, I feel your condition warrants further evaluation and I recommend that you be seen in a face to face visit.   NOTE: There will be NO CHARGE for this eVisit   If you are having a true medical emergency please call 911.      For an urgent face to face visit,  has seven urgent care centers for your convenience:     [facility] at [facility] Directions [phone] [address] [place], [place] [address]    [facility] Center [facility]) Get Driving Directions [phone] [address] [place], [place] [address]  [facility]) Get Driving Directions [phone] [address] [place],  [place]  [address]  [facility]gent Care Center [facility] [employer] Directions  [phone] [address] [place],  [place] [address]   [facility] at [facility] Get Driving Directions [phone] [address] [place], [place] [address]   [facility] Urgent Care at [facility] Get Driving Directions  [phone] [address] [place], [place] [address]   [facility] Ur[facility] Care at [facility] Directions [phone] [address], [address] [place], [place] [address]  Your MyChart E-visit questionnaire answers were reviewed by a board certified advanced clinical practitioner to complete your personal care plan based on your specific symptoms.  Thank you for using e-Visits.   I provided 5 minutes of non face-to-face time during this encounter for chart review and documentation.

## 2022-03-06 NOTE — Progress Notes (Signed)
I have spent 5 minutes in review of e-visit questionnaire, review and updating patient chart, medical decision making and response to patient.   Ajahnae Rathgeber Cody Elpidio Thielen, PA-C    

## 2022-03-29 DIAGNOSIS — Z419 Encounter for procedure for purposes other than remedying health state, unspecified: Secondary | ICD-10-CM | POA: Diagnosis not present

## 2022-04-28 DIAGNOSIS — Z419 Encounter for procedure for purposes other than remedying health state, unspecified: Secondary | ICD-10-CM | POA: Diagnosis not present

## 2022-05-04 DIAGNOSIS — F330 Major depressive disorder, recurrent, mild: Secondary | ICD-10-CM | POA: Diagnosis not present

## 2022-05-22 ENCOUNTER — Emergency Department (HOSPITAL_COMMUNITY)
Admission: EM | Admit: 2022-05-22 | Discharge: 2022-05-22 | Discharge status: Against Medical Advice | Payer: BC Managed Care – PPO | Attending: Emergency Medicine | Admitting: Emergency Medicine

## 2022-05-22 ENCOUNTER — Encounter (HOSPITAL_COMMUNITY): Payer: Self-pay | Admitting: Emergency Medicine

## 2022-05-22 ENCOUNTER — Emergency Department (HOSPITAL_COMMUNITY): Payer: BC Managed Care – PPO

## 2022-05-22 ENCOUNTER — Other Ambulatory Visit: Payer: Self-pay

## 2022-05-22 DIAGNOSIS — M25531 Pain in right wrist: Secondary | ICD-10-CM | POA: Diagnosis not present

## 2022-05-22 DIAGNOSIS — Y92410 Unspecified street and highway as the place of occurrence of the external cause: Secondary | ICD-10-CM | POA: Diagnosis not present

## 2022-05-22 DIAGNOSIS — Z5321 Procedure and treatment not carried out due to patient leaving prior to being seen by health care provider: Secondary | ICD-10-CM | POA: Insufficient documentation

## 2022-05-22 NOTE — ED Notes (Signed)
Patient transported to X-ray 
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
## 2022-05-22 NOTE — ED Notes (Signed)
Pt advised that she must wait on the adult side waiting area to be evaluated. Mother states I don't think my kids will be able to sit still and wait. Advised pt that adults are not seen in the pediatric ED. Pt verbalized understanding and walked to lobby
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
## 2022-05-29 DIAGNOSIS — Z419 Encounter for procedure for purposes other than remedying health state, unspecified: Secondary | ICD-10-CM | POA: Diagnosis not present

## 2022-06-05 ENCOUNTER — Ambulatory Visit: Payer: BC Managed Care – PPO | Admitting: Obstetrics and Gynecology

## 2022-06-28 DIAGNOSIS — Z419 Encounter for procedure for purposes other than remedying health state, unspecified: Secondary | ICD-10-CM | POA: Diagnosis not present

## 2022-07-13 ENCOUNTER — Telehealth: Payer: BC Managed Care – PPO | Admitting: Nurse Practitioner

## 2022-07-13 DIAGNOSIS — B9689 Other specified bacterial agents as the cause of diseases classified elsewhere: Secondary | ICD-10-CM | POA: Diagnosis not present

## 2022-07-13 DIAGNOSIS — N760 Acute vaginitis: Secondary | ICD-10-CM

## 2022-07-13 MED ORDER — METRONIDAZOLE 500 MG PO TABS: 500.0000 mg | ORAL_TABLET | Freq: Two times a day (BID) | ORAL | 0 refills | Status: DC

## 2022-07-13 NOTE — Progress Notes (Signed)
E-Visit for Vaginal Symptoms  We are sorry that you are not feeling well. Here is how we plan to help! Based on what you shared with me it looks like you: May have a vaginosis due to bacteria  Vaginosis is an inflammation of the vagina that can result in discharge, itching and pain. The cause is usually a change in the normal balance of vaginal bacteria or an infection. Vaginosis can also result from reduced estrogen levels after menopause.  The most common causes of vaginosis are:   Bacterial vaginosis which results from an overgrowth of one on several organisms that are normally present in your vagina.   Yeast infections which are caused by a naturally occurring fungus called candida.   Vaginal atrophy (atrophic vaginosis) which results from the thinning of the vagina from reduced estrogen levels after menopause.   Trichomoniasis which is caused by a parasite and is commonly transmitted by sexual intercourse.  Factors that increase your risk of developing vaginosis include: Medications, such as antibiotics and steroids Uncontrolled diabetes Use of hygiene products such as bubble bath, vaginal spray or vaginal deodorant Douching Wearing damp or tight-fitting clothing Using an intrauterine device (IUD) for birth control Hormonal changes, such as those associated with pregnancy, birth control pills or menopause Sexual activity Having a sexually transmitted infection  Your treatment plan is Metronidazole or Flagyl 500mg twice a day for 7 days.  I have electronically sent this prescription into the pharmacy that you have chosen.  Be sure to take all of the medication as directed. Stop taking any medication if you develop a rash, tongue swelling or shortness of breath. Mothers who are breast feeding should consider pumping and discarding their breast milk while on these antibiotics. However, there is no consensus that infant exposure at these doses would be harmful.  Remember that  medication creams can weaken latex condoms. .   HOME CARE:  Good hygiene may prevent some types of vaginosis from recurring and may relieve some symptoms:  Avoid baths, hot tubs and whirlpool spas. Rinse soap from your outer genital area after a shower, and dry the area well to prevent irritation. Don't use scented or harsh soaps, such as those with deodorant or antibacterial action. Avoid irritants. These include scented tampons and pads. Wipe from front to back after using the toilet. Doing so avoids spreading fecal bacteria to your vagina.  Other things that may help prevent vaginosis include:  Don't douche. Your vagina doesn't require cleansing other than normal bathing. Repetitive douching disrupts the normal organisms that reside in the vagina and can actually increase your risk of vaginal infection. Douching won't clear up a vaginal infection. Use a latex condom. Both female and female latex condoms may help you avoid infections spread by sexual contact. Wear cotton underwear. Also wear pantyhose with a cotton crotch. If you feel comfortable without it, skip wearing underwear to bed. Yeast thrives in moist environments Your symptoms should improve in the next day or two.  GET HELP RIGHT AWAY IF:  You have pain in your lower abdomen ( pelvic area or over your ovaries) You develop nausea or vomiting You develop a fever Your discharge changes or worsens You have persistent pain with intercourse You develop shortness of breath, a rapid pulse, or you faint.  These symptoms could be signs of problems or infections that need to be evaluated by a medical provider now.  MAKE SURE YOU   Understand these instructions. Will watch your condition. Will get help right   away if you are not doing well or get worse.  Thank you for choosing an e-visit.  Your e-visit answers were reviewed by a board certified advanced clinical practitioner to complete your personal care plan. Depending upon the  condition, your plan could have included both over the counter or prescription medications.  Please review your pharmacy choice. Make sure the pharmacy is open so you can pick up prescription now. If there is a problem, you may contact your provider through MyChart messaging and have the prescription routed to another pharmacy.  Your safety is important to us. If you have drug allergies check your prescription carefully.   For the next 24 hours you can use MyChart to ask questions about today's visit, request a non-urgent call back, or ask for a work or school excuse. You will get an email in the next two days asking about your experience. I hope that your e-visit has been valuable and will speed your recovery.  Latoya Estephania Licciardi, FNP   5-10 minutes spent reviewing and documenting in chart.  

## 2022-07-27 ENCOUNTER — Telehealth: Payer: Self-pay | Admitting: Urgent Care

## 2022-07-27 DIAGNOSIS — O219 Vomiting of pregnancy, unspecified: Secondary | ICD-10-CM

## 2022-07-28 MED ORDER — DOXYLAMINE-PYRIDOXINE 10-10 MG PO TBEC: 2.0000 | DELAYED_RELEASE_TABLET | Freq: Every evening | ORAL | 0 refills | Status: DC | PRN

## 2022-07-28 NOTE — Progress Notes (Signed)
E-Visit for Vomiting  We are sorry that you are not feeling well. Here is how we plan to help!  Based on what you have shared with me it looks like you have vomiting secondary to your pregnancy.  Vomiting is the forceful emptying of a portion of the stomach's content through the mouth.  Although nausea and vomiting can make you feel miserable, it's important to remember that these are not diseases, but rather symptoms of an underlying illness.  When we treat short term symptoms, we always caution that any symptoms that persist should be fully evaluated in a medical office.  I have prescribed a medication that will help alleviate your symptoms and allow you to stay hydrated:  Diclegis. This is the only acceptable medication for nausea and vomiting related to pregnancy. Please take 2 tabs by mouth at night. If symptoms persist after 2 days, increase to one tab in the morning and 2 tabs at night. Please notify your OB immediately and request any further recommendations.   HOME CARE: Drink clear liquids.  This is very important! Dehydration (the lack of fluid) can lead to a serious complication.  Start off with 1 tablespoon every 5 minutes for 8 hours. You may begin eating bland foods after 8 hours without vomiting.  Start with saltine crackers, white bread, rice, mashed potatoes, applesauce. After 48 hours on a bland diet, you may resume a normal diet. Try to go to sleep.  Sleep often empties the stomach and relieves the need to vomit.  GET HELP RIGHT AWAY IF:  Your symptoms do not improve or worsen within 2 days after treatment. You have a fever for over 3 days. You cannot keep down fluids after trying the medication.  MAKE SURE YOU:  Understand these instructions. Will watch your condition. Will get help right away if you are not doing well or get worse.   Thank you for choosing an e-visit.  Your e-visit answers were reviewed by a board certified advanced clinical practitioner to  complete your personal care plan. Depending upon the condition, your plan could have included both over the counter or prescription medications.  Please review your pharmacy choice. Make sure the pharmacy is open so you can pick up prescription now. If there is a problem, you may contact your provider through [employer] and have the prescription routed to another pharmacy.  Your safety is important to [place]. If you have drug allergies check your prescription carefully.   For the next 24 hours you can use MyChart to ask questions about today's visit, request a non-urgent call back, or ask for a work or school excuse. You will get an email in the next two days asking about your experience. I hope that your e-visit has been valuable and will speed your recovery.   I have spent 5 minutes in review of e-visit questionnaire, review and updating patient chart, medical decision making and response to patient.   Rhodesia Stanger L Shakti Fleer, PA

## 2022-07-29 DIAGNOSIS — Z419 Encounter for procedure for purposes other than remedying health state, unspecified: Secondary | ICD-10-CM | POA: Diagnosis not present

## 2022-08-16 ENCOUNTER — Encounter (HOSPITAL_COMMUNITY): Payer: Self-pay

## 2022-08-16 ENCOUNTER — Other Ambulatory Visit: Payer: Self-pay

## 2022-08-16 ENCOUNTER — Emergency Department (HOSPITAL_COMMUNITY)
Admission: EM | Admit: 2022-08-16 | Discharge: 2022-08-16 | Discharge status: Against Medical Advice | Payer: Medicaid Other | Attending: Emergency Medicine | Admitting: Emergency Medicine

## 2022-08-16 ENCOUNTER — Emergency Department (HOSPITAL_COMMUNITY): Payer: Medicaid Other

## 2022-08-16 ENCOUNTER — Emergency Department (HOSPITAL_COMMUNITY)
Admission: EM | Admit: 2022-08-16 | Discharge: 2022-08-16 | Disposition: A | Discharge status: Home / Self Care | Payer: Medicaid Other | Source: Home / Self Care | Attending: Emergency Medicine | Admitting: Emergency Medicine

## 2022-08-16 DIAGNOSIS — Z91040 Latex allergy status: Secondary | ICD-10-CM | POA: Insufficient documentation

## 2022-08-16 DIAGNOSIS — M545 Low back pain, unspecified: Secondary | ICD-10-CM

## 2022-08-16 DIAGNOSIS — M546 Pain in thoracic spine: Secondary | ICD-10-CM | POA: Diagnosis not present

## 2022-08-16 DIAGNOSIS — Y92410 Unspecified street and highway as the place of occurrence of the external cause: Secondary | ICD-10-CM | POA: Insufficient documentation

## 2022-08-16 DIAGNOSIS — Z5321 Procedure and treatment not carried out due to patient leaving prior to being seen by health care provider: Secondary | ICD-10-CM | POA: Diagnosis not present

## 2022-08-16 DIAGNOSIS — M47812 Spondylosis without myelopathy or radiculopathy, cervical region: Secondary | ICD-10-CM | POA: Diagnosis not present

## 2022-08-16 DIAGNOSIS — M542 Cervicalgia: Secondary | ICD-10-CM | POA: Insufficient documentation

## 2022-08-16 DIAGNOSIS — M2578 Osteophyte, vertebrae: Secondary | ICD-10-CM | POA: Diagnosis not present

## 2022-08-16 MED ORDER — METHOCARBAMOL 500 MG PO TABS: 500.0000 mg | ORAL_TABLET | Freq: Two times a day (BID) | ORAL | 0 refills | Status: DC
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
## 2022-08-16 NOTE — ED Notes (Signed)
Pt upset from previous visit earlier today with staff prior to being seen in the ER department.
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
## 2022-08-16 NOTE — ED Notes (Signed)
Pt left because she couldn't be seen with her daughter on the PEDS side per ED Zack Seal.
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
## 2022-08-29 DIAGNOSIS — Z419 Encounter for procedure for purposes other than remedying health state, unspecified: Secondary | ICD-10-CM | POA: Diagnosis not present

## 2022-09-27 DIAGNOSIS — Z419 Encounter for procedure for purposes other than remedying health state, unspecified: Secondary | ICD-10-CM | POA: Diagnosis not present

## 2022-10-17 DIAGNOSIS — F4320 Adjustment disorder, unspecified: Secondary | ICD-10-CM | POA: Diagnosis not present

## 2022-10-28 DIAGNOSIS — Z419 Encounter for procedure for purposes other than remedying health state, unspecified: Secondary | ICD-10-CM | POA: Diagnosis not present

## 2022-10-30 DIAGNOSIS — F4320 Adjustment disorder, unspecified: Secondary | ICD-10-CM | POA: Diagnosis not present

## 2022-11-27 DIAGNOSIS — Z419 Encounter for procedure for purposes other than remedying health state, unspecified: Secondary | ICD-10-CM | POA: Diagnosis not present

## 2022-12-18 DIAGNOSIS — L659 Nonscarring hair loss, unspecified: Secondary | ICD-10-CM | POA: Diagnosis not present

## 2022-12-28 DIAGNOSIS — Z419 Encounter for procedure for purposes other than remedying health state, unspecified: Secondary | ICD-10-CM | POA: Diagnosis not present

## 2023-01-27 DIAGNOSIS — Z419 Encounter for procedure for purposes other than remedying health state, unspecified: Secondary | ICD-10-CM | POA: Diagnosis not present

## 2023-02-17 ENCOUNTER — Ambulatory Visit: Payer: BC Managed Care – PPO | Admitting: Obstetrics & Gynecology

## 2023-02-27 DIAGNOSIS — Z419 Encounter for procedure for purposes other than remedying health state, unspecified: Secondary | ICD-10-CM | POA: Diagnosis not present

## 2023-03-30 DIAGNOSIS — Z419 Encounter for procedure for purposes other than remedying health state, unspecified: Secondary | ICD-10-CM | POA: Diagnosis not present

## 2023-04-29 DIAGNOSIS — Z419 Encounter for procedure for purposes other than remedying health state, unspecified: Secondary | ICD-10-CM | POA: Diagnosis not present

## 2023-05-14 ENCOUNTER — Telehealth: Payer: BC Managed Care – PPO | Admitting: Physician Assistant

## 2023-05-14 DIAGNOSIS — O219 Vomiting of pregnancy, unspecified: Secondary | ICD-10-CM

## 2023-05-14 NOTE — Progress Notes (Signed)
For the safety of you and your child, I recommend you reach out to your PCP or OBGYN for management of this excessive vomiting in pregnancy. That is not something we can fully evaluate or manage via e-visit.  If you are having a true medical emergency please call 911.    For an urgent face to face visit, [facility] has six urgent care centers for your convenience:     [facility] at [facility] Directions [phone] [address] [place], [place] [address]    [facility]) Get Driving Directions [phone] [address] [place], [place] [address]  [facility] Urgent Care Center [facility]) Get Driving Directions [phone] [address] [place],  [place]  [address]  [facility] Health Urgent Care at [facility] Get Driving Directions [phone] [address] [place], [place] [address]   [facility] Health Urgent Care at [facility] Get Driving Directions  [phone] [address] [place], [place] [address]   [facility] Urgent Care at [facility] Directions [phone] [address], [address] [place], [place] [address]  Your MyChart E-visit questionnaire answers were reviewed by a board certified advanced clinical practitioner to complete your personal care plan based on your specific symptoms.  Thank you for using e-Visits.

## 2023-05-30 DIAGNOSIS — Z419 Encounter for procedure for purposes other than remedying health state, unspecified: Secondary | ICD-10-CM | POA: Diagnosis not present

## 2023-06-29 DIAGNOSIS — Z419 Encounter for procedure for purposes other than remedying health state, unspecified: Secondary | ICD-10-CM | POA: Diagnosis not present

## 2023-07-30 DIAGNOSIS — Z419 Encounter for procedure for purposes other than remedying health state, unspecified: Secondary | ICD-10-CM | POA: Diagnosis not present

## 2023-08-30 DIAGNOSIS — Z419 Encounter for procedure for purposes other than remedying health state, unspecified: Secondary | ICD-10-CM | POA: Diagnosis not present

## 2023-09-15 ENCOUNTER — Other Ambulatory Visit: Payer: Self-pay

## 2023-09-15 ENCOUNTER — Telehealth: Payer: BC Managed Care – PPO | Admitting: Physician Assistant

## 2023-09-15 DIAGNOSIS — Z3009 Encounter for other general counseling and advice on contraception: Secondary | ICD-10-CM

## 2023-09-15 MED ORDER — LEVONORGESTREL 1.5 MG PO TABS: 1.5000 mg | ORAL_TABLET | Freq: Once | ORAL | 0 refills | Status: AC

## 2023-09-15 NOTE — Telephone Encounter (Signed)
 Pt called requesting a prescription for Plan B as her insurance will cover it.  Called pt to verify pharmacy.  Pt informed that Plan B has been sent to her [facility] on [facility] [address].   Latoya Palmer

## 2023-09-15 NOTE — Progress Notes (Signed)
  Because of requesting Plan B and we being unable to fill contraceptive medications, I feel your condition warrants further evaluation and I recommend that you be seen in a face-to-face visit.   NOTE: There will be NO CHARGE for this E-Visit   If you are having a true medical emergency, please call 911.     For an urgent face to face visit, [facility] has multiple urgent care centers for your convenience.  Click the link below for the full list of locations and hours, walk-in wait times, appointment scheduling options and driving directions:  Urgent Care - [place], [place], [place], [place], [place], [place]  [place]     Your MyChart E-visit questionnaire answers were reviewed by a board certified advanced clinical practitioner to complete your personal care plan based on your specific symptoms.    Thank you for using e-Visits.     I have spent 5 minutes in review of e-visit questionnaire, review and updating patient chart, medical decision making and response to patient.   Margaretann Loveless, PA-C

## 2023-09-27 DIAGNOSIS — Z419 Encounter for procedure for purposes other than remedying health state, unspecified: Secondary | ICD-10-CM | POA: Diagnosis not present

## 2023-11-08 DIAGNOSIS — Z419 Encounter for procedure for purposes other than remedying health state, unspecified: Secondary | ICD-10-CM | POA: Diagnosis not present

## 2023-12-08 DIAGNOSIS — Z419 Encounter for procedure for purposes other than remedying health state, unspecified: Secondary | ICD-10-CM | POA: Diagnosis not present

## 2024-01-08 DIAGNOSIS — Z419 Encounter for procedure for purposes other than remedying health state, unspecified: Secondary | ICD-10-CM | POA: Diagnosis not present

## 2024-02-07 DIAGNOSIS — Z419 Encounter for procedure for purposes other than remedying health state, unspecified: Secondary | ICD-10-CM | POA: Diagnosis not present

## 2024-03-03 DIAGNOSIS — D225 Melanocytic nevi of trunk: Secondary | ICD-10-CM | POA: Diagnosis not present

## 2024-03-03 DIAGNOSIS — L578 Other skin changes due to chronic exposure to nonionizing radiation: Secondary | ICD-10-CM | POA: Diagnosis not present

## 2024-03-03 DIAGNOSIS — L821 Other seborrheic keratosis: Secondary | ICD-10-CM | POA: Diagnosis not present

## 2024-03-03 DIAGNOSIS — L814 Other melanin hyperpigmentation: Secondary | ICD-10-CM | POA: Diagnosis not present

## 2024-03-09 DIAGNOSIS — Z419 Encounter for procedure for purposes other than remedying health state, unspecified: Secondary | ICD-10-CM | POA: Diagnosis not present

## 2024-04-09 DIAGNOSIS — Z419 Encounter for procedure for purposes other than remedying health state, unspecified: Secondary | ICD-10-CM | POA: Diagnosis not present

## 2024-04-19 ENCOUNTER — Other Ambulatory Visit: Payer: Self-pay

## 2024-04-19 ENCOUNTER — Encounter: Payer: Self-pay | Admitting: Obstetrics and Gynecology

## 2024-04-19 ENCOUNTER — Other Ambulatory Visit (HOSPITAL_COMMUNITY)
Admission: RE | Admit: 2024-04-19 | Discharge: 2024-04-19 | Disposition: A | Source: Ambulatory Visit | Attending: Obstetrics and Gynecology | Admitting: Obstetrics and Gynecology

## 2024-04-19 ENCOUNTER — Ambulatory Visit (INDEPENDENT_AMBULATORY_CARE_PROVIDER_SITE_OTHER): Admitting: Obstetrics and Gynecology

## 2024-04-19 VITALS — BP 122/82 | HR 67 | Ht 65.0 in | Wt 180.2 lb

## 2024-04-19 DIAGNOSIS — Z01419 Encounter for gynecological examination (general) (routine) without abnormal findings: Secondary | ICD-10-CM | POA: Insufficient documentation

## 2024-04-19 DIAGNOSIS — Z1331 Encounter for screening for depression: Secondary | ICD-10-CM

## 2024-04-19 DIAGNOSIS — Z113 Encounter for screening for infections with a predominantly sexual mode of transmission: Secondary | ICD-10-CM | POA: Diagnosis not present

## 2024-04-19 DIAGNOSIS — Z124 Encounter for screening for malignant neoplasm of cervix: Secondary | ICD-10-CM | POA: Diagnosis not present

## 2024-04-19 NOTE — Progress Notes (Signed)
 GYNECOLOGY ANNUAL PREVENTATIVE CARE ENCOUNTER NOTE  History:     Latoya Palmer is a 39 y.o. [phone] female here for a routine annual gynecologic exam.  Current complaints: none.   Denies abnormal vaginal bleeding, discharge, pelvic pain, problems with intercourse or other gynecologic concerns.    Gynecologic History Patient's last menstrual period was 04/06/2024 (exact date). Contraception: none Last Pap: 09/04/20. Results were: normal with negative HPV Last mammogram: n/a  Obstetric History OB History  Gravida Para Term Preterm AB Living  9 2 2  0 7 3  SAB IAB Ectopic Multiple Live Births  3 4 0 1 3    # Outcome Date GA Lbr Len/2nd Weight Sex Type Anes PTL Lv  9A Term 03/17/19 [redacted]w[redacted]d 13:47 / 00:34 6 lb 5.4 oz (2.875 kg) M Vag-Spont EPI  LIV  9B Term 03/17/19 [redacted]w[redacted]d 13:47 / 01:12 6 lb 3.5 oz (2.82 kg) M Vag-Spont EPI  LIV  8 SAB 08/26/13             Birth Comments: System Generated. Please review and update pregnancy details.  7 Term 09/26/06    F Vag-Spont EPI Y LIV  6 IAB           5 IAB           4 IAB           3 IAB           2 SAB           1 SAB             Past Medical History:  Diagnosis Date   Abnormal Pap smear    Anemia    with pregnancy   Headache(784.0)    hx - last one 09/2016 -otc prn   Heart murmur    hx - no problems; June - heart u/s left chamber enlarged   HGSIL (high grade squamous intraepithelial lesion) on Pap smear of cervix 07/19/2016   [ ]  Colposcopy and ECC   Miscarriage 09/17/2013   07/2013 - miscarriage - reeval BHCG to ensure resolved.    Ovarian cyst    Pneumonia 2018   Preterm labor     Past Surgical History:  Procedure Laterality Date   CERVICAL CONIZATION W/BX N/A 11/05/2016   Procedure: CONIZATION CERVIX WITH BIOPSY - COLD KNIFE;  Surgeon: Harland JAYSON Birkenhead, MD;  Location: [facility] ORS;  Service: Gynecology;  Laterality: N/A;   CESAREAN SECTION N/A 03/17/2019   Procedure: VAGINAL DELIVERY OF TWINS IN OPERATING ROOM;  Surgeon: Kandis Devaughn Sayres, MD;  Location: [facility] LD ORS;  Service: Obstetrics;  Laterality: N/A;   COLPOSCOPY     MYRINGOTOMY     THERAPEUTIC ABORTION     two    No current outpatient medications on file prior to visit.   No current facility-administered medications on file prior to visit.    Allergies  Allergen Reactions   Latex Swelling and Other (See Comments)    Causes irritation    Social History:  reports that she quit smoking about 5 years ago. Her smoking use included cigarettes. She started smoking about 16 years ago. She has a 5.5 pack-year smoking history. She has never used smokeless tobacco. She reports that she does not drink alcohol and does not use drugs.  Family History  Problem Relation Age of Onset   Heart attack Maternal Grandmother 23   Prostate cancer Paternal Grandfather    Leukemia Paternal Grandfather  Cancer - Prostate Paternal Grandfather    Pancreatic cancer Other    Cancer Neg Hx    Diabetes Neg Hx    Kidney disease Neg Hx    Hypertension Neg Hx     The following portions of the patient's history were reviewed and updated as appropriate: allergies, current medications, past family history, past medical history, past social history, past surgical history and problem list.  Review of Systems Pertinent items noted in HPI and remainder of comprehensive ROS otherwise negative.  Physical Exam:  BP 122/82   Pulse 67   Ht 5' 5 (1.651 m)   Wt 180 lb 3.2 oz (81.7 kg)   LMP 04/06/2024 (Exact Date)   BMI 29.99 kg/m  CONSTITUTIONAL: Well-developed, well-nourished female in no acute distress.  HENT:  Normocephalic, atraumatic, External right and left ear normal. Oropharynx is clear and moist EYES: Conjunctivae and EOM are normal.  NECK: Normal range of motion, supple, no masses.  Normal thyroid.  SKIN: Skin is warm and dry. No rash noted. Not diaphoretic. No erythema. No pallor. MUSCULOSKELETAL: Normal range of motion. No tenderness.  No cyanosis, clubbing, or edema.   2+ distal pulses. NEUROLOGIC: Alert and oriented to person, place, and time. Normal reflexes, muscle tone coordination.  PSYCHIATRIC: Normal mood and affect. Normal behavior. Normal judgment and thought content. CARDIOVASCULAR: Normal heart rate noted, regular rhythm RESPIRATORY: Clear to auscultation bilaterally. Effort and breath sounds normal, no problems with respiration noted. BREASTS: Symmetric in size. No masses, tenderness, skin changes, nipple drainage, or lymphadenopathy bilaterally. Performed in the presence of a chaperone. ABDOMEN: Soft, no distention noted.  No tenderness, rebound or guarding.  PELVIC: Normal appearing external genitalia and urethral meatus; normal appearing vaginal mucosa and cervix.  No abnormal discharge noted.  Pap smear obtained. Vagina swab taken. Normal uterine size, no other palpable masses, no uterine or adnexal tenderness.  Performed in the presence of a chaperone.   Assessment and Plan:    1. Cervical cancer screening (Primary)  - Cytology - PAP( [place])  2. Screening for STD (sexually transmitted disease) Per pt request - HIV Antibody (routine testing w rflx) - RPR - Hepatitis B Surface AntiGEN - Hepatitis C Antibody - Cervicovaginal ancillary only  3. Women's annual routine gynecological examination Normal annual exam - Cytology - PAP( [facility])  Will follow up results of pap smear and manage accordingly. Routine preventative health maintenance measures emphasized. Please refer to After Visit Summary for other counseling recommendations.      Jerilynn Buddle, MD, FACOG Obstetrician & Gynecologist, [facility] for [facility], [facility]

## 2024-04-20 LAB — CERVICOVAGINAL ANCILLARY ONLY
Bacterial Vaginitis (gardnerella): POSITIVE — AB
Candida Glabrata: NEGATIVE
Candida Vaginitis: NEGATIVE
Chlamydia: NEGATIVE
Comment: NEGATIVE
Comment: NEGATIVE
Comment: NEGATIVE
Comment: NEGATIVE
Comment: NEGATIVE
Comment: NORMAL
Neisseria Gonorrhea: NEGATIVE
Trichomonas: NEGATIVE

## 2024-04-20 LAB — HIV ANTIBODY (ROUTINE TESTING W REFLEX): HIV Screen 4th Generation wRfx: NONREACTIVE

## 2024-04-20 LAB — HEPATITIS C ANTIBODY: Hep C Virus Ab: NONREACTIVE

## 2024-04-20 LAB — RPR: RPR Ser Ql: NONREACTIVE

## 2024-04-20 LAB — HEPATITIS B SURFACE ANTIGEN: Hepatitis B Surface Ag: NEGATIVE

## 2024-04-21 ENCOUNTER — Ambulatory Visit: Payer: Self-pay | Admitting: Obstetrics and Gynecology

## 2024-04-21 DIAGNOSIS — N760 Acute vaginitis: Secondary | ICD-10-CM

## 2024-04-21 LAB — CYTOLOGY - PAP
Comment: NEGATIVE
Diagnosis: NEGATIVE
High risk HPV: NEGATIVE

## 2024-04-21 MED ORDER — METRONIDAZOLE 500 MG PO TABS: 500.0000 mg | ORAL_TABLET | Freq: Two times a day (BID) | ORAL | 1 refills | Status: DC

## 2024-06-09 DIAGNOSIS — Z419 Encounter for procedure for purposes other than remedying health state, unspecified: Secondary | ICD-10-CM | POA: Diagnosis not present

## 2024-07-01 ENCOUNTER — Telehealth: Admitting: Physician Assistant

## 2024-07-01 DIAGNOSIS — N760 Acute vaginitis: Secondary | ICD-10-CM

## 2024-07-01 DIAGNOSIS — B9689 Other specified bacterial agents as the cause of diseases classified elsewhere: Secondary | ICD-10-CM

## 2024-07-01 MED ORDER — CLINDAMYCIN HCL 300 MG PO CAPS: 300.0000 mg | ORAL_CAPSULE | Freq: Two times a day (BID) | ORAL | 0 refills | Status: AC

## 2024-07-01 NOTE — Patient Instructions (Signed)
 Rosina GORMAN Gearing, thank you for joining [name], PA-C for today's virtual visit.  While this provider is not your primary care provider (PCP), if your PCP is located in our provider database this encounter information will be shared with them immediately following your visit.   A [facility] MyChart account gives you access to today's visit and all your visits, tests, and labs performed at [facility]  click here if you don't have a  MyChart account or go to [email or web address]  Consent: (Patient) Latoya Palmer provided verbal consent for this virtual visit at the beginning of the encounter.  Current Medications:  Current Outpatient Medications:    clindamycin  (CLEOCIN ) 300 MG capsule, Take 1 capsule (300 mg total) by mouth 2 (two) times daily., Disp: 14 capsule, Rfl: 0   Medications ordered in this encounter:  Meds ordered this encounter  Medications   clindamycin  (CLEOCIN ) 300 MG capsule    Sig: Take 1 capsule (300 mg total) by mouth 2 (two) times daily.    Dispense:  14 capsule    Refill:  0    Supervising Provider:   BLAISE ALEENE KIDD [[id number]]     *If you need refills on other medications prior to your next appointment, please contact your pharmacy*  Follow-Up: Call back or seek an in-person evaluation if the symptoms worsen or if the condition fails to improve as anticipated.  [facility] [phone]  Other Instructions Vaginal Infection (Bacterial Vaginosis): What to Know  Bacterial vaginosis is an infection of the vagina. It happens when the balance of normal germs (bacteria) in the vagina changes. If you don't get treated, it can make it easier for you to get other infections from sex. These are called sexually transmitted infections (STIs). If you're pregnant, you need to get treated right away. This infection can cause a baby to be born early or at a low birth weight. What are the causes? This infection  happens when too many harmful germs grow in the vagina. You can't get this infection from toilet seats, bedsheets, swimming pools, or things that touch your vagina. What increases the risk? Having sex with a new person or more than one person. Having sex without protection. Douching. Having an intrauterine device (IUD). Smoking. Using drugs or drinking alcohol. These can lead you to do risky things. Taking certain antibiotics. Being pregnant. What are the signs or symptoms? Some females have no symptoms. Symptoms may include: A gray or white discharge from your vagina. It can be watery or foamy. A fishy smell. This can happen after sex or during your menstrual period. Itching in and around your vagina. Burning or pain when you pee. How is this treated? This infection is treated with antibiotics. These may be given to you as: A pill. A cream for your vagina. A medicine that you put into your vagina (suppository). If the infection comes back, you may need more antibiotics. Follow these instructions at home: Medicines Take your medicines as told. Take or use your antibiotics as told. Do not stop using them even if you start to feel better. General instructions If the person you have sex with is a female, tell her that you have this infection. She will need to follow up with her doctor. Female partners don't need to be treated. Do not have sex until you finish treatment. Drink more fluids as told. Keep your vagina and butt clean. Wash these areas with warm water each day. Wipe from front  to back after you poop. If you're breastfeeding a baby, talk to your doctor if you should keep doing so during treatment. How is this prevented? Self-care Do not douche. Do not use deodorant sprays on your vagina. Wear cotton underwear. Do not wear tight pants and pantyhose, especially in the summer. Safe sex Use condoms the correct way and every time you have sex. Use dental dams to protect  yourself during oral sex. Limit how many people you have sex with. Get tested for STIs. The person you have sex with should also get tested. Drugs and alcohol Do not smoke, vape, or use nicotine or tobacco. Do not use drugs. Limit the amount of alcohol you drink because it can lead you to do risky things. Where to find more information To learn more: Go to tonerpromos.no. Click Health Topics A-Z. Type bacterial vaginosis in the search bar. American Sexual Health Association (ASHA): ashasexualhealth.org U.S. Department of Health and [employer], Office on Women's Health: travellesson.ca Contact a doctor if: Your symptoms don't get better, even after treatment. You have more discharge or pain when you pee. You have a fever or chills. You have pain in your belly or in the area between your hips. You have pain during sex. You bleed from your vagina between menstrual periods. This information is not intended to replace advice given to you by your health care provider. Make sure you discuss any questions you have with your health care provider. Document Revised: 01/01/2023 Document Reviewed: 01/01/2023 Elsevier Patient Education  2024 Elsevier Inc.   If you have been instructed to have an in-person evaluation today at a local Urgent Care facility, please use the link below. It will take you to a list of all of our available [facility], including address, phone number and hours of operation. Please do not delay care.  [facility]  If you or a family member do not have a primary care provider, use the link below to schedule a visit and establish care. When you choose a [facility] primary care physician or advanced practice provider, you gain a long-term partner in health. Find a Primary Care Provider  Learn more about [facility] in-office and virtual care options:  - Get Care Now

## 2024-07-01 NOTE — Progress Notes (Signed)
 Virtual Visit Consent   Latoya Palmer, you are scheduled for a virtual visit with a [facility] provider today. Just as with appointments in the office, your consent must be obtained to participate. Your consent will be active for this visit and any virtual visit you may have with one of our providers in the next 365 days. If you have a MyChart account, a copy of this consent can be sent to you electronically.  As this is a virtual visit, video technology does not allow for your provider to perform a traditional examination. This may limit your provider's ability to fully assess your condition. If your provider identifies any concerns that need to be evaluated in person or the need to arrange testing (such as labs, EKG, etc.), we will make arrangements to do so. Although advances in technology are sophisticated, we cannot ensure that it will always work on either your end or our end. If the connection with a video visit is poor, the visit may have to be switched to a telephone visit. With either a video or telephone visit, we are not always able to ensure that we have a secure connection.  By engaging in this virtual visit, you consent to the provision of healthcare and authorize for your insurance to be billed (if applicable) for the services provided during this visit. Depending on your insurance coverage, you may receive a charge related to this service.  I need to obtain your verbal consent now. Are you willing to proceed with your visit today? Latoya Palmer has provided verbal consent on 07/01/2024 for a virtual visit (video or telephone). Latoya Palmer, [place]  Date: 07/01/2024 5:44 PM   Virtual Visit via Video Note   I, Latoya Palmer, connected with  Latoya Palmer  ([id number], 1985-09-13) on 07/01/24 at  5:30 PM EST by a video-enabled telemedicine application and verified that I am speaking with the correct person using two identifiers.  Location: Patient: Virtual Visit  Location Patient: Home Provider: Virtual Visit Location Provider: Home Office   I discussed the limitations of evaluation and management by telemedicine and the availability of in person appointments. The patient expressed understanding and agreed to proceed.    History of Present Illness: Latoya Palmer is a 38 y.o. who identifies as a female who was assigned female at birth, and is being seen today for concern of bacterial vaginosis. Endorses symptoms have been present over past few weeks with vaginal discharge that is thin and foul-smelling. Just ended her menstrual period. Denies concerns for STI.  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  Resting comfortably at home.  Head is normocephalic, atraumatic.  No labored breathing. Speech is clear and coherent with logical content.  Patient is alert and oriented at baseline.   Assessment and Plan: 1. Bacterial vaginosis (Primary) - clindamycin  (CLEOCIN ) 300 MG capsule; Take 1 capsule (300  mg total) by mouth 2 (two) times daily.  Dispense: 14 capsule; Refill: 0  Supportive measures and OTC medications reviewed. Giving recent use of Flagyl , will Rx Clindamycin  BID x 7 days. In-person follow-up precautions reviewed.  Follow Up Instructions: I discussed the assessment and treatment plan with the patient.  The patient was provided an opportunity to ask questions and all were answered. The patient agreed with the plan and demonstrated an understanding of the instructions.  A copy of instructions were sent to the patient via MyChart unless otherwise noted below.   The patient was advised to call back or seek an in-person evaluation if the symptoms worsen or if the condition fails to improve as anticipated.    Latoya Velma Lunger, PA-C
